# Patient Record
Sex: Female | Born: 1995 | Race: Black or African American | Hispanic: No | Marital: Single | Smoking: Never smoker
Health system: Southern US, Community
[De-identification: ages and names within clinical notes are randomized; demographics above are authoritative.]

## PROBLEM LIST (undated history)

## (undated) ENCOUNTER — Inpatient Hospital Stay (HOSPITAL_COMMUNITY): Payer: Self-pay

## (undated) DIAGNOSIS — O1490 Unspecified pre-eclampsia, unspecified trimester: Secondary | ICD-10-CM

## (undated) DIAGNOSIS — D649 Anemia, unspecified: Secondary | ICD-10-CM

## (undated) DIAGNOSIS — Z789 Other specified health status: Secondary | ICD-10-CM

## (undated) HISTORY — DX: Anemia, unspecified: D64.9

## (undated) HISTORY — PX: WISDOM TOOTH EXTRACTION: SHX21

## (undated) HISTORY — PX: NO PAST SURGERIES: SHX2092

---

## 2014-08-22 ENCOUNTER — Emergency Department (HOSPITAL_COMMUNITY): Payer: Medicaid Other

## 2014-08-22 ENCOUNTER — Encounter (HOSPITAL_COMMUNITY): Payer: Self-pay | Admitting: Family Medicine

## 2014-08-22 ENCOUNTER — Emergency Department (HOSPITAL_COMMUNITY)
Admission: EM | Admit: 2014-08-22 | Discharge: 2014-08-22 | Disposition: A | Discharge status: Home / Self Care | Payer: Medicaid Other | Attending: Emergency Medicine | Admitting: Emergency Medicine

## 2014-08-22 DIAGNOSIS — N7011 Chronic salpingitis: Secondary | ICD-10-CM | POA: Diagnosis not present

## 2014-08-22 DIAGNOSIS — Z87891 Personal history of nicotine dependence: Secondary | ICD-10-CM | POA: Diagnosis not present

## 2014-08-22 DIAGNOSIS — N760 Acute vaginitis: Secondary | ICD-10-CM | POA: Diagnosis not present

## 2014-08-22 DIAGNOSIS — Z3202 Encounter for pregnancy test, result negative: Secondary | ICD-10-CM | POA: Insufficient documentation

## 2014-08-22 DIAGNOSIS — B9689 Other specified bacterial agents as the cause of diseases classified elsewhere: Secondary | ICD-10-CM

## 2014-08-22 DIAGNOSIS — N739 Female pelvic inflammatory disease, unspecified: Secondary | ICD-10-CM | POA: Diagnosis not present

## 2014-08-22 DIAGNOSIS — N390 Urinary tract infection, site not specified: Secondary | ICD-10-CM | POA: Insufficient documentation

## 2014-08-22 DIAGNOSIS — R102 Pelvic and perineal pain: Secondary | ICD-10-CM | POA: Diagnosis present

## 2014-08-22 LAB — URINALYSIS, ROUTINE W REFLEX MICROSCOPIC
Bilirubin Urine: NEGATIVE
Glucose, UA: NEGATIVE mg/dL
Hgb urine dipstick: NEGATIVE
Ketones, ur: 15 mg/dL — AB
NITRITE: NEGATIVE
PROTEIN: 30 mg/dL — AB
Specific Gravity, Urine: 1.024 (ref 1.005–1.030)
UROBILINOGEN UA: 1 mg/dL (ref 0.0–1.0)
pH: 6 (ref 5.0–8.0)

## 2014-08-22 LAB — WET PREP, GENITAL
TRICH WET PREP: NONE SEEN
Yeast Wet Prep HPF POC: NONE SEEN

## 2014-08-22 LAB — PREGNANCY, URINE: Preg Test, Ur: NEGATIVE

## 2014-08-22 LAB — URINE MICROSCOPIC-ADD ON

## 2014-08-22 LAB — GC/CHLAMYDIA PROBE AMP (~~LOC~~) NOT AT ARMC
CHLAMYDIA, DNA PROBE: POSITIVE — AB
Neisseria Gonorrhea: NEGATIVE

## 2014-08-22 MED ORDER — METRONIDAZOLE 500 MG PO TABS
500.0000 mg | ORAL_TABLET | Freq: Once | ORAL | Status: AC
  Administered 2014-08-22: 500 mg via ORAL
  Filled 2014-08-22: qty 1

## 2014-08-22 MED ORDER — LIDOCAINE HCL (PF) 1 % IJ SOLN
2.0000 mL | Freq: Once | INTRAMUSCULAR | Status: AC
  Administered 2014-08-22: 2 mL

## 2014-08-22 MED ORDER — CEFTRIAXONE SODIUM 250 MG IJ SOLR
250.0000 mg | Freq: Once | INTRAMUSCULAR | Status: AC
  Administered 2014-08-22: 250 mg via INTRAMUSCULAR
  Filled 2014-08-22: qty 250

## 2014-08-22 MED ORDER — LIDOCAINE HCL (PF) 1 % IJ SOLN
INTRAMUSCULAR | Status: AC
Start: 2014-08-22 — End: 2014-08-22
  Administered 2014-08-22: 2 mL
  Filled 2014-08-22: qty 5

## 2014-08-22 MED ORDER — METRONIDAZOLE 500 MG PO TABS: 500.0000 mg | ORAL_TABLET | Freq: Two times a day (BID) | ORAL | Status: DC

## 2014-08-22 MED ORDER — DOXYCYCLINE HYCLATE 100 MG PO TABS
100.0000 mg | ORAL_TABLET | Freq: Once | ORAL | Status: AC
  Administered 2014-08-22: 100 mg via ORAL
  Filled 2014-08-22: qty 1

## 2014-08-22 MED ORDER — CEPHALEXIN 500 MG PO CAPS: 500.0000 mg | ORAL_CAPSULE | Freq: Four times a day (QID) | ORAL | Status: DC

## 2014-08-22 MED ORDER — DOXYCYCLINE HYCLATE 100 MG PO TABS: 100.0000 mg | ORAL_TABLET | Freq: Two times a day (BID) | ORAL | Status: DC

## 2014-08-22 NOTE — ED Notes (Signed)
Pt presents with c/o low abdominal/pelvic pain and nausea x2weeks.

## 2014-08-22 NOTE — ED Notes (Addendum)
Pt comfortable with discharge and follow up instructions. Pt declines wheelchair, escorted to waiting area by this RN. Prescriptions x3. Pt comfortable with waiting until 1440 for discharge to observe for reaction

## 2014-08-22 NOTE — Discharge Instructions (Signed)
°Emergency Department Resource Guide °1) Find a Doctor and Pay Out of Pocket °Although you won't have to find out who is covered by your insurance plan, it is a good idea to ask around and get recommendations. You will then need to call the office and see if the doctor you have chosen will accept you as a new patient and what types of options they offer for patients who are self-pay. Some doctors offer discounts or will set up payment plans for their patients who do not have insurance, but you will need to ask so you aren't surprised when you get to your appointment. ° °2) Contact Your Local Health Department °Not all health departments have doctors that can see patients for sick visits, but many do, so it is worth a call to see if yours does. If you don't know where your local health department is, you can check in your phone book. The CDC also has a tool to help you locate your state's health department, and many state websites also have listings of all of their local health departments. ° °3) Find a Walk-in Clinic °If your illness is not likely to be very severe or complicated, you may want to try a walk in clinic. These are popping up all over the country in pharmacies, drugstores, and shopping centers. They're usually staffed by nurse practitioners or physician assistants that have been trained to treat common illnesses and complaints. They're usually fairly quick and inexpensive. However, if you have serious medical issues or chronic medical problems, these are probably not your best option. ° °No Primary Care Doctor: °- Call [facility] at  832-8000 - they can help you locate a primary care doctor that  accepts your insurance, provides certain services, etc. °- Physician Referral Service- 1-800-533-3463 ° °Chronic Pain Problems: °Organization         Address  Phone   Notes  °[facility]  (336) 297-2271 Patients need to be referred by their primary care doctor.  ° °Medication  Assistance: °Organization         Address  Phone   Notes  °[place] [facility] [address] °[place], [place] [address] (336) 641-8030 --Must be a resident of [place] °-- Must have NO insurance coverage whatsoever (no Medicaid/ Medicare, etc.) °-- The pt. MUST have a primary care doctor that directs their care regularly and follows them in the community °  °[facility]  (866) 331-1348   °[employer]  (888) 892-1162   ° °Agencies that provide inexpensive medical care: °Organization         Address  Phone   Notes  °[facility]  (336) 832-8035   °[facility]    (336) 832-7272   °[facility] [address] °[place], [place] [address] (336) 832-4777   °[facility] [address], °[place] (336) 271-4999   °[facility]    (336) 373-0678   °[facility]    (336) 272-1050   °[facility] ° [address], [place] Phone:  (336) 832-4444, Fax:  (336) 832-4440 Hours of Operation:  9 am - 6 pm, M-F.  Also accepts Medicaid/Medicare and self-pay.  °[facility] ° [address], [place] Phone: (336) 832-3150, Fax: (336) 832-3151. Hours of Operation:  8:30 am - 5:30 pm, M-F.  Also accepts Medicaid and self-pay.  °[facility] [place] [address], [place] Phone: (336) 878-6027   °[facility] [address], [place], [employer] (336)723-1848, Ext. 123 Mondays & Thursdays: 7-9 AM.  First 15 patients are seen on a first come, first serve basis. °  ° °Medicaid-accepting [place] Providers: ° °Organization         Address  Phone   Notes  °[facility] [address], [place] (336) 641-2100 Also accepts self-pay patients.  °[facility] [address], [place] ° (336) 856-9996   °[facility] [address] [place] [address], [place]  (336) 288-8857   °[facility] [address] [place] [address], [place] (336) 299-7000   °Veita Bland [address], [place]  ° (336) 373-1557 Only accepts [place] Access Medicaid patients after they have their name applied to their card.  ° °Self-Pay (no insurance) in [place]: ° °Organization         Address  Phone   Notes  °Sickle Cell Patients, [facility] [address], [place] (336) 832-1970   °[facility] [address], [place] (336) 832-4400   °[facility] ° [address], [place] (336) 992-4800   °[facility]/Dr. Osei-Bonsu ° [address] [place] [address], [place] or [address], [place] (336) 841-8500 Phone number for both [place] and [place] locations is the same.  °[facility] [address], [place] (336) 299-0000   °[facility] [address] [place] [address], [place] or [address] (336) 852-7530 °(336) 878-2260   °[facility] [address], [place] (336) 350-1642, phone; (336) 294-5005, fax Sees patients 1st and 3rd Saturday of every month.  Must not qualify for public or private insurance (i.e. Medicaid, Medicare, [place] Health Choice, Veterans' Benefits) • Household income should be no more than 200% of the poverty level •The clinic cannot treat you if you are pregnant or think you are pregnant • Sexually transmitted diseases are not treated at the clinic.  ° ° °Dental Care: °Organization         Address  Phone  Notes  °[place] [facility] [address], [place] (336) 641-6152 Accepts children up to age 21 who are enrolled in Medicaid or [place] Health Choice; pregnant women with a Medicaid card; and children who have applied for Medicaid or [place] Health Choice, but were declined, whose parents can pay a reduced fee at time of service.  °[place]  [facility] [place]  [address] [place] [address], [place] (336) 641-7733 Accepts children up to age 21 who are enrolled in Medicaid or [place] Health Choice; pregnant women with a Medicaid card; and children who have applied for Medicaid or  Health Choice, but were declined, whose parents can pay a reduced fee at time of service.  °[facility] ° [address], [place] (336) 641-4533 Patients are seen by appointment only. Walk-ins are not accepted. [facility] will see patients 18 years of age and older. °Monday - Tuesday (8am-5pm) °Most Wednesdays (8:30-5pm) °$30 per visit, cash only  °[facility] ° [address] [place] [address], [place] (336) 641-4533 Patients are seen by appointment only. Walk-ins are not accepted. [facility] will see patients 18 years of age and older. °One   Wednesday Evening (Monthly: Volunteer Based).  $30 per visit, cash only  °[facility]  (919) 537-3737 for adults; Children under age 4, call Graduate Pediatric Dentistry at (919) [phone]. Children aged 4-14, please call (919) [phone] to request a pediatric application. ° Dental services are provided in all areas of dental care including fillings, crowns and bridges, complete and partial dentures, implants, gum treatment, root canals, and extractions. Preventive care is also provided. Treatment is provided to both adults and children. °Patients are selected via a lottery and there is often a waiting list. °  °[facility] [address], °[place] ° (336) 763-8833 [email or web address] °  °[facility] [address], [place], [place] (336)723-1848, Ext. 123 Second and Fourth Thursday of each month, opens at 6:30 AM; Clinic ends at 9 AM.  Patients are seen on a first-come first-served basis, and a limited number are seen during each clinic.  ° °[facility] ° [address], [place], [place] (336) 723-7904    Eligibility Requirements °You must have lived in [place], [place], or [place] counties for at least the last three months. °  You cannot be eligible for state or federal sponsored healthcare insurance, including Veterans Administration, Medicaid, or Medicare. °  You generally cannot be eligible for healthcare insurance through your employer.  °  How to apply: °Eligibility screenings are held every Tuesday and Wednesday afternoon from 1:00 pm until 4:00 pm. You do not need an appointment for the interview!  °[facility] [address], [place], [employer] 336-631-2330   °[facility]  336-342-8273   °[facility]  336-703-3100   °[facility]  336-570-6415   ° °Behavioral Health Resources in the Community: °Intensive Outpatient Programs °Organization         Address  Phone  Notes  °[facility] [address], [place], [place] 336-878-6098   °[facility] [address], [place], [place] 336-832-9800   °[facility] [address], [place], [place] ° 336-882-2125   °[facility] [address],  °[place], [place] 1-800-853-5163 or 336-641-4981   °Substance Abuse Resources °Organization         Address  Phone  Notes  °[facility]  336-882-2125   °[facility]  336-784-9470   °[facility]  336-285-9073   °[facility]  336-845-3988   °Residential & Outpatient Substance Abuse Program  1-800-659-3381   °Psychological Services °Organization         Address  Phone  Notes  °[facility]  336- 832-9600   °[facility]  336- 378-7881   °[facility] [address], [place] 1-800-853-5163 or 336-641-4981   ° °Mobile Crisis Teams °Organization         Address  Phone  Notes  °[facility], Mobile Crisis Care Unit  1-877-626-1772   °Assertive °[facility] ° [address]  [place], [place] 336-834-9664   °Sharon DeEsch [address] °[place] 336-554-5454   ° °Self-Help/Support Groups °Organization         Address  Phone             Notes  °[facility] - variety of support groups  336- 373-1402 Call for more information  °Narcotics Anonymous (NA), [facility] [address], °[place]  2 meetings at this location  ° °  Residential Treatment Programs °Organization         Address  Phone  Notes  °[facility] [address],    °[place]  1-866-801-8205   °[facility] ° [address], [place], [place] 704-293-8524   °[facility] [address], [place] 336-845-3988 Admissions: 8am-3pm M-F  °[facility] [address]    °[place], [place] 336-841-1104   °[facility] [address], [place], [place] 336-379-7146   °[facility] [address]  °[place], [place] 336-285-9073   °[facility] - Intensive Outpatient [address], [place], [place] 336-852-3033   °[facility] [address]  °[place], [place] 1-877-615-2722 or 336-784-9470   °[facility] [address] [place], [place] 336-227-7417 Accepts Medicaid  °[facility] [address]  °[place] [employer] [place] 1-800-659-3381 Substance Abuse/Addiction Treatment  ° °[place] Behavioral Health Resources °Organization         Address  Phone  Notes  °[facility]  (888) 581-9988   °Julie Brannon, PhD [address] [place], [place]   (336) 349-5553 or (336) 951-0000   °[facility]   [address] °[place], [place] (336) 349-4454   °[facility] [address], [place], [place] (336) 342-8316 Insurance/Medicaid/sponsorship through [facility]  °[facility] [address]                                    [place], [place] (336) 342-8316 Therapy/tele-psych/case    °[facility] [address]  ° [place],  (336) 349-2233    °Dr. Arfeen  (336) 349-4544   °[facility] [place]  [employer] [place] [facility] 1) [address], [place] °2) [address], [place] °3)  [address], [place] (336) 349-3220 °(336) 342-7768 ° °(336) 342-8140   °[place] [facility] (336) 342-1394 or (336) 342-3537 (After Hours)    ° ° ° °Take the prescriptions as directed.  Call your regular OB/GYN doctor today to schedule a follow up appointment within the next 3 days.  Return to the Emergency Department immediately sooner if worsening.  ° °

## 2014-08-22 NOTE — ED Notes (Signed)
Contacted radiology regarding delay in results. States results have been submitted.  This RN contacted IT department to inquire about delay.

## 2014-08-22 NOTE — ED Provider Notes (Signed)
CSN: [id number]     Arrival date & time 08/22/14  1012 History   First MD Initiated Contact with Patient 08/22/14 1018     Chief Complaint  Patient presents with  . Pelvic Pain      HPI  Pt was seen at 1025.  Per pt, c/o gradual onset and persistence of constant pelvic pain for the past 2 weeks. Has been associated with intermittent nausea. LMP "the beginning of last month." Denies vaginal bleeding/discharge, no dysuria/hematuria, no flank pain, no N/V/D, no fevers, no rash.    History reviewed. No pertinent past medical history.   History reviewed. No pertinent past surgical history.  History  Substance Use Topics  . Smoking status: Former Games developermoker  . Smokeless tobacco: Not on file  . Alcohol Use: Yes     Comment: occ    Review of Systems ROS: Statement: All systems negative except as marked or noted in the HPI; Constitutional: Negative for fever and chills. ; ; Eyes: Negative for eye pain, redness and discharge. ; ; ENMT: Negative for ear pain, hoarseness, nasal congestion, sinus pressure and sore throat. ; ; Cardiovascular: Negative for chest pain, palpitations, diaphoresis, dyspnea and peripheral edema. ; ; Respiratory: Negative for cough, wheezing and stridor. ; ; Gastrointestinal: +nausea. Negative for vomiting, diarrhea, abdominal pain, blood in stool, hematemesis, jaundice and rectal bleeding. . ; ; Genitourinary: Negative for dysuria, flank pain and hematuria. ; ; GYN:  +pelvic pain. No vaginal bleeding, no vaginal discharge, no vulvar pain. ;; Musculoskeletal: Negative for back pain and neck pain. Negative for swelling and trauma.; ; Skin: Negative for pruritus, rash, abrasions, blisters, bruising and skin lesion.; ; Neuro: Negative for headache, lightheadedness and neck stiffness. Negative for weakness, altered level of consciousness , altered mental status, extremity weakness, paresthesias, involuntary movement, seizure and syncope.      Allergies  Review of patient's  allergies indicates no known allergies.  Home Medications   Prior to Admission medications   Not on File   BP 117/73 mmHg  Pulse 87  Temp(Src) 98.8 F (37.1 C) (Oral)  Resp 16  Ht 5\' 1"  (1.549 m)  SpO2 100%  LMP 07/03/2014 (Approximate) Physical Exam  1030: Physical examination:  Nursing notes reviewed; Vital signs and O2 SAT reviewed;  Constitutional: Well developed, Well nourished, Well hydrated, In no acute distress; Head:  Normocephalic, atraumatic; Eyes: EOMI, PERRL, No scleral icterus; ENMT: Mouth and pharynx normal, Mucous membranes moist; Neck: Supple, Full range of motion, No lymphadenopathy; Cardiovascular: Regular rate and rhythm, No murmur, rub, or gallop; Respiratory: Breath sounds clear & equal bilaterally, No rales, rhonchi, wheezes.  Speaking full sentences with ease, Normal respiratory effort/excursion; Chest: Nontender, Movement normal; Abdomen: Soft, +mild suprapubic tenderness to palp. No rebound or guarding. Nondistended, Normal bowel sounds; Genitourinary: No CVA tenderness. Pelvic exam performed with permission of pt and female ED RN assist during exam.  External genitalia w/o lesions. Vaginal vault with thick white discharge.  Cervix w/o lesions, not friable, GC/chlam and wet prep obtained and sent to lab.  Bimanual exam w/o CMT, uterine or adnexal tenderness.;; Extremities: Pulses normal, No tenderness, No edema, No calf edema or asymmetry.; Neuro: AA&Ox3, Major CN grossly intact.  Speech clear. No gross focal motor or sensory deficits in extremities.; Skin: Color normal, Warm, Dry.   ED Course  Procedures     EKG Interpretation None      MDM  MDM Reviewed: nursing note and vitals Interpretation: labs and ultrasound     IMPRESSION:  No sonographic evidence for ovarian torsion.  3.5 cm indeterminate complex cystic lesion in the left adnexa which is contiguous with the left ovary. Differential diagnosis includes cystic ovarian neoplasm and hydro  pyosalpinx. 4.8 cm complex cystic lesion in the right adnexa, suspicious for hydrosalpinx. Probable small collapsing right ovarian corpus luteum. Recommend followup by pelvic ultrasound in 6-12 weeks; alternatively, pelvic MRI without and with contrast could be performed for further evaluation.    Results for orders placed or performed during the hospital encounter of 08/22/14  Wet prep, genital  Result Value Ref Range   Yeast Wet Prep HPF POC NONE SEEN NONE SEEN   Trich, Wet Prep NONE SEEN NONE SEEN   Clue Cells Wet Prep HPF POC FEW (A) NONE SEEN   WBC, Wet Prep HPF POC MODERATE (A) NONE SEEN  Pregnancy, urine  Result Value Ref Range   Preg Test, Ur NEGATIVE NEGATIVE  Urinalysis, Routine w reflex microscopic  Result Value Ref Range   Color, Urine AMBER (A) YELLOW   APPearance CLEAR CLEAR   Specific Gravity, Urine 1.024 1.005 - 1.030   pH 6.0 5.0 - 8.0   Glucose, UA NEGATIVE NEGATIVE mg/dL   Hgb urine dipstick NEGATIVE NEGATIVE   Bilirubin Urine NEGATIVE NEGATIVE   Ketones, ur 15 (A) NEGATIVE mg/dL   Protein, ur 30 (A) NEGATIVE mg/dL   Urobilinogen, UA 1.0 0.0 - 1.0 mg/dL   Nitrite NEGATIVE NEGATIVE   Leukocytes, UA SMALL (A) NEGATIVE  Urine microscopic-add on  Result Value Ref Range   Squamous Epithelial / LPF FEW (A) RARE   WBC, UA 11-20 <3 WBC/hpf   RBC / HPF 0-2 <3 RBC/hpf   Bacteria, UA FEW (A) RARE   Urine-Other MUCOUS PRESENT     1340:  GC/chlam pending, but will tx empirically given US findings. Will also tx for BV and UTI.  Pt states she wants to go home now. Dx and testing d/w pt.  Questions answered.  Verb understanding, agreeable to d/c home with outpt f/u.    Samuel Jester, DO 08/25/14 1342

## 2014-08-30 ENCOUNTER — Telehealth (HOSPITAL_BASED_OUTPATIENT_CLINIC_OR_DEPARTMENT_OTHER): Payer: Self-pay | Admitting: Emergency Medicine

## 2014-09-03 ENCOUNTER — Telehealth: Payer: Self-pay | Admitting: *Deleted

## 2014-09-05 ENCOUNTER — Telehealth (HOSPITAL_BASED_OUTPATIENT_CLINIC_OR_DEPARTMENT_OTHER): Payer: Self-pay | Admitting: Emergency Medicine

## 2014-09-05 NOTE — Telephone Encounter (Signed)
Letter sent, unable to contact patient by phone regarding positive Chlamydia

## 2014-09-17 ENCOUNTER — Telehealth (HOSPITAL_COMMUNITY): Payer: Self-pay

## 2014-09-17 NOTE — ED Notes (Signed)
Unable to contact pt by mail or telephone. Unable to communicate lab results or treatment changes. 

## 2014-11-14 ENCOUNTER — Telehealth (HOSPITAL_BASED_OUTPATIENT_CLINIC_OR_DEPARTMENT_OTHER): Payer: Self-pay | Admitting: Emergency Medicine

## 2014-11-14 NOTE — Telephone Encounter (Signed)
Letter sent on 09/06/2014 returned with no known address, unable to forward

## 2014-11-21 ENCOUNTER — Encounter (HOSPITAL_COMMUNITY): Payer: Self-pay | Admitting: *Deleted

## 2014-11-21 ENCOUNTER — Inpatient Hospital Stay (HOSPITAL_COMMUNITY)
Admission: AD | Admit: 2014-11-21 | Discharge: 2014-11-21 | Disposition: A | Discharge status: Home / Self Care | Payer: Medicaid Other | Source: Ambulatory Visit | Attending: Obstetrics & Gynecology | Admitting: Obstetrics & Gynecology

## 2014-11-21 DIAGNOSIS — N923 Ovulation bleeding: Secondary | ICD-10-CM | POA: Insufficient documentation

## 2014-11-21 DIAGNOSIS — Z87891 Personal history of nicotine dependence: Secondary | ICD-10-CM | POA: Insufficient documentation

## 2014-11-21 DIAGNOSIS — N939 Abnormal uterine and vaginal bleeding, unspecified: Secondary | ICD-10-CM | POA: Diagnosis not present

## 2014-11-21 DIAGNOSIS — R109 Unspecified abdominal pain: Secondary | ICD-10-CM | POA: Insufficient documentation

## 2014-11-21 HISTORY — DX: Other specified health status: Z78.9

## 2014-11-21 LAB — CBC
HCT: 30.6 % — ABNORMAL LOW (ref 36.0–46.0)
Hemoglobin: 9.9 g/dL — ABNORMAL LOW (ref 12.0–15.0)
MCH: 25.9 pg — AB (ref 26.0–34.0)
MCHC: 32.4 g/dL (ref 30.0–36.0)
MCV: 80.1 fL (ref 78.0–100.0)
PLATELETS: 287 10*3/uL (ref 150–400)
RBC: 3.82 MIL/uL — AB (ref 3.87–5.11)
RDW: 14.7 % (ref 11.5–15.5)
WBC: 8.6 10*3/uL (ref 4.0–10.5)

## 2014-11-21 LAB — URINALYSIS, ROUTINE W REFLEX MICROSCOPIC
Bilirubin Urine: NEGATIVE
GLUCOSE, UA: NEGATIVE mg/dL
Ketones, ur: NEGATIVE mg/dL
Leukocytes, UA: NEGATIVE
Nitrite: NEGATIVE
PH: 5.5 (ref 5.0–8.0)
Protein, ur: NEGATIVE mg/dL
Specific Gravity, Urine: >1.03 — ABNORMAL HIGH (ref 1.005–1.030)
Urobilinogen, UA: 0.2 mg/dL (ref 0.0–1.0)

## 2014-11-21 LAB — WET PREP, GENITAL
Clue Cells Wet Prep HPF POC: NONE SEEN
Trich, Wet Prep: NONE SEEN
YEAST WET PREP: NONE SEEN

## 2014-11-21 LAB — URINE MICROSCOPIC-ADD ON

## 2014-11-21 LAB — POCT PREGNANCY, URINE: Preg Test, Ur: NEGATIVE

## 2014-11-21 NOTE — MAU Note (Signed)
Pt states she started having abdominal pain about 2-3 wks ago, pt states she started having vaginal bleeding today, lmp 11/08/2014.

## 2014-11-21 NOTE — MAU Provider Note (Signed)
History     CSN: [id number]  Arrival date and time: 11/21/14 7829   None     Chief Complaint  Patient presents with  . Abdominal Pain  . Vaginal Bleeding   HPI Comments: Ms.Sonya Frazier is a 19 y.o. Female [id number] who presents to MAU with abdominal pain and vaginal bleeding.  This is a new problem; she is sexually active with one partner who she has been with for almost a year. Denies history of STD's. She is concerned because this is the second bleeding episode she has had this month; she has already had a cycle this month and is now having brown, bleeding.   Abdominal Pain This is a new problem. The current episode started 1 to 4 weeks ago. The onset quality is gradual. The problem occurs intermittently. The problem has been unchanged. The pain is located in the LLQ, RLQ and suprapubic region. The pain is at a severity of 0/10. The quality of the pain is cramping. Pertinent negatives include no constipation, diarrhea, fever, nausea or vomiting. Treatments tried: ibuprofen  The treatment provided mild relief.    OB History    Gravida Para Term Preterm AB TAB SAB Ectopic Multiple Living        Past Medical History  Diagnosis Date  . Medical history non-contributory     Past Surgical History  Procedure Laterality Date  . Wisdom tooth extraction      Family History  Problem Relation Age of Onset  . Asthma Mother   . Cancer Maternal Aunt     History  Substance Use Topics  . Smoking status: Former Games developer  . Smokeless tobacco: Not on file  . Alcohol Use: Yes     Comment: occ    Allergies: No Known Allergies  Prescriptions prior to admission  Medication Sig Dispense Refill Last Dose  . cephALEXin (KEFLEX) 500 MG capsule Take 1 capsule (500 mg total) by mouth 4 (four) times daily. (Patient not taking: Reported on 11/21/2014) 40 capsule 0   . doxycycline (VIBRA-TABS) 100 MG tablet Take 1 tablet (100 mg total) by mouth 2 (two) times daily.  (Patient not taking: Reported on 11/21/2014) 14 tablet 0   . metroNIDAZOLE (FLAGYL) 500 MG tablet Take 1 tablet (500 mg total) by mouth 2 (two) times daily. (Patient not taking: Reported on 11/21/2014) 14 tablet 0    Results for orders placed or performed during the hospital encounter of 11/21/14 (from the past 48 hour(s))  Urinalysis, Routine w reflex microscopic     Status: Abnormal   Collection Time: 11/21/14  7:11 PM  Result Value Ref Range   Color, Urine YELLOW YELLOW   APPearance CLEAR CLEAR   Specific Gravity, Urine >1.030 (H) 1.005 - 1.030   pH 5.5 5.0 - 8.0   Glucose, UA NEGATIVE NEGATIVE mg/dL   Hgb urine dipstick LARGE (A) NEGATIVE   Bilirubin Urine NEGATIVE NEGATIVE   Ketones, ur NEGATIVE NEGATIVE mg/dL   Protein, ur NEGATIVE NEGATIVE mg/dL   Urobilinogen, UA 0.2 0.0 - 1.0 mg/dL   Nitrite NEGATIVE NEGATIVE   Leukocytes, UA NEGATIVE NEGATIVE  Urine microscopic-add on     Status: Abnormal   Collection Time: 11/21/14  7:11 PM  Result Value Ref Range   Squamous Epithelial / LPF FEW (A) RARE   WBC, UA 0-2 <3 WBC/hpf   RBC / HPF 3-6 <3 RBC/hpf   Bacteria, UA RARE RARE   Urine-Other MUCOUS PRESENT  Pregnancy, urine POC     Status: None   Collection Time: 11/21/14  7:15 PM  Result Value Ref Range   Preg Test, Ur NEGATIVE NEGATIVE    Comment:        THE SENSITIVITY OF THIS METHODOLOGY IS >24 mIU/mL     Review of Systems  Constitutional: Negative for fever.  Gastrointestinal: Positive for abdominal pain. Negative for nausea, vomiting, diarrhea and constipation.   Physical Exam   Blood pressure 125/82, pulse 90, temperature 98.5 F (36.9 C), temperature source Oral, resp. rate 18, height 4' 11.5" (1.511 m), weight 55.339 kg (122 lb), last menstrual period 11/06/2014.  Physical Exam  Constitutional: She is oriented to person, place, and time. She appears well-developed and well-nourished. No distress.  HENT:  Head: Normocephalic.  Eyes: Pupils are equal, round, and  reactive to light.  Neck: Neck supple.  Respiratory: Effort normal.  GI: Soft. There is tenderness in the suprapubic area. There is no rebound, no guarding and no CVA tenderness.  Genitourinary:  Bimanual exam: Cervix closed, no CMT, dark brown discharge noted on exam glove.  Uterus non tender, normal size Adnexa non tender, no masses bilaterally, + suprapubic tenderness  GC/Chlam, wet prep done; blind swab  Chaperone present for exam.  Musculoskeletal: Normal range of motion.  Neurological: She is alert and oriented to person, place, and time.  Skin: Skin is warm. She is not diaphoretic.  Psychiatric: Her behavior is normal.   Results for orders placed or performed during the hospital encounter of 11/21/14 (from the past 24 hour(s))  Urinalysis, Routine w reflex microscopic     Status: Abnormal   Collection Time: 11/21/14  7:11 PM  Result Value Ref Range   Color, Urine YELLOW YELLOW   APPearance CLEAR CLEAR   Specific Gravity, Urine >1.030 (H) 1.005 - 1.030   pH 5.5 5.0 - 8.0   Glucose, UA NEGATIVE NEGATIVE mg/dL   Hgb urine dipstick LARGE (A) NEGATIVE   Bilirubin Urine NEGATIVE NEGATIVE   Ketones, ur NEGATIVE NEGATIVE mg/dL   Protein, ur NEGATIVE NEGATIVE mg/dL   Urobilinogen, UA 0.2 0.0 - 1.0 mg/dL   Nitrite NEGATIVE NEGATIVE   Leukocytes, UA NEGATIVE NEGATIVE  Urine microscopic-add on     Status: Abnormal   Collection Time: 11/21/14  7:11 PM  Result Value Ref Range   Squamous Epithelial / LPF FEW (A) RARE   WBC, UA 0-2 <3 WBC/hpf   RBC / HPF 3-6 <3 RBC/hpf   Bacteria, UA RARE RARE   Urine-Other MUCOUS PRESENT   Pregnancy, urine POC     Status: None   Collection Time: 11/21/14  7:15 PM  Result Value Ref Range   Preg Test, Ur NEGATIVE NEGATIVE  Wet prep, genital     Status: Abnormal   Collection Time: 11/21/14  8:00 PM  Result Value Ref Range   Yeast Wet Prep HPF POC NONE SEEN NONE SEEN   Trich, Wet Prep NONE SEEN NONE SEEN   Clue Cells Wet Prep HPF POC NONE SEEN  NONE SEEN   WBC, Wet Prep HPF POC FEW (A) NONE SEEN  CBC     Status: Abnormal   Collection Time: 11/21/14  8:14 PM  Result Value Ref Range   WBC 8.6 4.0 - 10.5 K/uL   RBC 3.82 (L) 3.87 - 5.11 MIL/uL   Hemoglobin 9.9 (L) 12.0 - 15.0 g/dL   HCT 16.1 (L) 09.6 - 04.5 %   MCV 80.1 78.0 - 100.0 fL   MCH 25.9 (  L) 26.0 - 34.0 pg   MCHC 32.4 30.0 - 36.0 g/dL   RDW 96.014.7 45.411.5 - 09.815.5 %   Platelets 287 150 - 400 K/uL    MAU Course  Procedures  None  MDM  Wet prep GC  HIV   Report given to Thressa ShellerHeather Jaelan Rasheed CNM who resumes care of the patient   Assessment and Plan   1. Intermenstrual spotting    Recommended patient to keep a menstrual calendar If intermenstrual spotting continues FU with PCP or HD Return to MAU as needed  Follow-up Information    [facility] [place].   Why:  If symptoms worsen   Contact information:   [address] [facility] [place] [id number] [phone]     Tawnya CrookHogan, Rishik Tubby Donovan 8:36 PM 11/21/2014    Duane LopeJennifer I Rasch, NP 11/21/2014 7:51 PM

## 2014-11-21 NOTE — MAU Note (Signed)
So far the past 2 wks has been having sharp pains in abd.  Had a period the beginning of the month and started bleeding again today.

## 2014-11-22 LAB — GC/CHLAMYDIA PROBE AMP (~~LOC~~) NOT AT ARMC
Chlamydia: NEGATIVE
Neisseria Gonorrhea: NEGATIVE

## 2014-11-22 LAB — HIV ANTIBODY (ROUTINE TESTING W REFLEX): HIV SCREEN 4TH GENERATION: NONREACTIVE

## 2016-06-28 ENCOUNTER — Emergency Department (HOSPITAL_COMMUNITY)
Admission: EM | Admit: 2016-06-28 | Discharge: 2016-06-28 | Disposition: A | Discharge status: Home / Self Care | Payer: Medicaid Other | Attending: Emergency Medicine | Admitting: Emergency Medicine

## 2016-06-28 ENCOUNTER — Encounter (HOSPITAL_COMMUNITY): Payer: Self-pay

## 2016-06-28 DIAGNOSIS — Z87891 Personal history of nicotine dependence: Secondary | ICD-10-CM | POA: Insufficient documentation

## 2016-06-28 DIAGNOSIS — N12 Tubulo-interstitial nephritis, not specified as acute or chronic: Secondary | ICD-10-CM | POA: Insufficient documentation

## 2016-06-28 LAB — CBC WITH DIFFERENTIAL/PLATELET
Basophils Absolute: 0 10*3/uL (ref 0.0–0.1)
Basophils Relative: 0 %
Eosinophils Absolute: 0.1 10*3/uL (ref 0.0–0.7)
Eosinophils Relative: 1 %
HEMATOCRIT: 35.4 % — AB (ref 36.0–46.0)
Hemoglobin: 11.4 g/dL — ABNORMAL LOW (ref 12.0–15.0)
Lymphocytes Relative: 30 %
Lymphs Abs: 3.3 10*3/uL (ref 0.7–4.0)
MCH: 26.6 pg (ref 26.0–34.0)
MCHC: 32.2 g/dL (ref 30.0–36.0)
MCV: 82.5 fL (ref 78.0–100.0)
MONO ABS: 0.8 10*3/uL (ref 0.1–1.0)
Monocytes Relative: 7 %
NEUTROS PCT: 62 %
Neutro Abs: 6.6 10*3/uL (ref 1.7–7.7)
PLATELETS: 356 10*3/uL (ref 150–400)
RBC: 4.29 MIL/uL (ref 3.87–5.11)
RDW: 13.3 % (ref 11.5–15.5)
WBC: 10.8 10*3/uL — ABNORMAL HIGH (ref 4.0–10.5)

## 2016-06-28 LAB — COMPREHENSIVE METABOLIC PANEL
ALK PHOS: 67 U/L (ref 38–126)
ALT: 12 U/L — ABNORMAL LOW (ref 14–54)
ANION GAP: 8 (ref 5–15)
AST: 17 U/L (ref 15–41)
Albumin: 4.2 g/dL (ref 3.5–5.0)
BUN: 11 mg/dL (ref 6–20)
CO2: 26 mmol/L (ref 22–32)
Calcium: 9.2 mg/dL (ref 8.9–10.3)
Chloride: 104 mmol/L (ref 101–111)
Creatinine, Ser: 0.84 mg/dL (ref 0.44–1.00)
GLUCOSE: 105 mg/dL — AB (ref 65–99)
Potassium: 3.3 mmol/L — ABNORMAL LOW (ref 3.5–5.1)
Sodium: 138 mmol/L (ref 135–145)
TOTAL PROTEIN: 8.5 g/dL — AB (ref 6.5–8.1)
Total Bilirubin: 0.6 mg/dL (ref 0.3–1.2)

## 2016-06-28 LAB — URINALYSIS, ROUTINE W REFLEX MICROSCOPIC
BILIRUBIN URINE: NEGATIVE
GLUCOSE, UA: NEGATIVE mg/dL
Ketones, ur: NEGATIVE mg/dL
Nitrite: NEGATIVE
PH: 6 (ref 5.0–8.0)
Protein, ur: 30 mg/dL — AB
SPECIFIC GRAVITY, URINE: 1.016 (ref 1.005–1.030)

## 2016-06-28 LAB — URINE MICROSCOPIC-ADD ON

## 2016-06-28 LAB — PREGNANCY, URINE: Preg Test, Ur: NEGATIVE

## 2016-06-28 MED ORDER — KETOROLAC TROMETHAMINE 15 MG/ML IJ SOLN
15.0000 mg | Freq: Once | INTRAMUSCULAR | Status: AC
  Administered 2016-06-28: 15 mg via INTRAVENOUS
  Filled 2016-06-28: qty 1

## 2016-06-28 MED ORDER — CEPHALEXIN 500 MG PO CAPS: 500.0000 mg | ORAL_CAPSULE | Freq: Three times a day (TID) | ORAL | 0 refills | Status: DC

## 2016-06-28 MED ORDER — CEFTRIAXONE SODIUM 1 G IJ SOLR
1.0000 g | Freq: Once | INTRAMUSCULAR | Status: AC
  Administered 2016-06-28: 1 g via INTRAVENOUS
  Filled 2016-06-28: qty 10

## 2016-06-28 MED ORDER — SODIUM CHLORIDE 0.9 % IV BOLUS (SEPSIS)
1000.0000 mL | Freq: Once | INTRAVENOUS | Status: AC
  Administered 2016-06-28: 1000 mL via INTRAVENOUS

## 2016-06-28 MED ORDER — POTASSIUM CHLORIDE CRYS ER 20 MEQ PO TBCR
40.0000 meq | EXTENDED_RELEASE_TABLET | Freq: Once | ORAL | Status: AC
  Administered 2016-06-28: 40 meq via ORAL
  Filled 2016-06-28: qty 2

## 2016-06-28 NOTE — ED Triage Notes (Addendum)
Pt with bilateral flank pain x 1 week. Some burning with urination.  No n/v.  No discharge.  Pain worse with movement.  No fever.  No injury noted.

## 2016-06-28 NOTE — ED Provider Notes (Signed)
[facility]-EMERGENCY DEPT Provider Note   CSN: [id number] Arrival date & time: 06/28/16  1715     History   Chief Complaint Chief Complaint  Patient presents with  . Flank Pain    HPI Sonya Frazier is a 20 y.o. female.  The history is provided by the patient. No language interpreter was used.  Flank Pain    Sonya Frazier is a 20 y.o. female who presents to the Emergency Department complaining of flank pain.  She reports 1 week of bilateral flank pain that she describes as kidney pain. The pain is sharp and burning in nature. It is worse in the mornings and with walking. At time the pain radiates to bilateral sides. She has associated dysuria. No hematuria, frequency or change in odor. No fevers, nausea, vomiting. No recent injuries. No prior similar symptoms. She has no history of UTI or kidney stone. No vaginal discharge. Symptoms are moderate, constant, worsening. Past Medical History:  Diagnosis Date  . Medical history non-contributory     There are no active problems to display for this patient.   Past Surgical History:  Procedure Laterality Date  . WISDOM TOOTH EXTRACTION      OB History    Gravida Para Term Preterm AB Living   0 0 0 0 0 0   SAB TAB Ectopic Multiple Live Births   0 0 0 0         Home Medications    Prior to Admission medications   Medication Sig Start Date End Date Taking? Authorizing Provider  cephALEXin (KEFLEX) 500 MG capsule Take 1 capsule (500 mg total) by mouth 3 (three) times daily. 06/28/16   Tilden FossaElizabeth Cherelle Midkiff, MD    Family History Family History  Problem Relation Age of Onset  . Asthma Mother   . Cancer Maternal Aunt     Social History Social History  Substance Use Topics  . Smoking status: Former Games developermoker  . Smokeless tobacco: Never Used  . Alcohol use Yes     Comment: occ     Allergies   Patient has no known allergies.   Review of Systems Review of Systems  Genitourinary: Positive for flank pain.  All other  systems reviewed and are negative.    Physical Exam Updated Vital Signs BP 111/78   Pulse 92   Temp 98.2 F (36.8 C) (Oral)   Resp 12   Ht 5' (1.524 m)   Wt 122 lb (55.3 kg)   LMP 06/03/2016 (Approximate)   SpO2 97%   BMI 23.83 kg/m   Physical Exam  Constitutional: She is oriented to person, place, and time. She appears well-developed and well-nourished.  HENT:  Head: Normocephalic and atraumatic.  Cardiovascular: Normal rate and regular rhythm.   No murmur heard. Pulmonary/Chest: Effort normal and breath sounds normal. No respiratory distress.  Abdominal: Soft. There is no tenderness. There is no rebound and no guarding.  Bilateral CVA tenderness  Musculoskeletal: She exhibits no edema or tenderness.  Neurological: She is alert and oriented to person, place, and time.  5 out of 5 strength in bilateral lower extremities  Skin: Skin is warm and dry.  Psychiatric: She has a normal mood and affect. Her behavior is normal.  Nursing note and vitals reviewed.    ED Treatments / Results  Labs (all labs ordered are listed, but only abnormal results are displayed) Labs Reviewed  URINALYSIS, ROUTINE W REFLEX MICROSCOPIC (NOT AT [facility]) - Abnormal; Notable for the following:  Result Value   APPearance CLOUDY (*)    Hgb urine dipstick SMALL (*)    Protein, ur 30 (*)    Leukocytes, UA MODERATE (*)    All other components within normal limits  COMPREHENSIVE METABOLIC PANEL - Abnormal; Notable for the following:    Potassium 3.3 (*)    Glucose, Bld 105 (*)    Total Protein 8.5 (*)    ALT 12 (*)    All other components within normal limits  CBC WITH DIFFERENTIAL/PLATELET - Abnormal; Notable for the following:    WBC 10.8 (*)    Hemoglobin 11.4 (*)    HCT 35.4 (*)    All other components within normal limits  URINE MICROSCOPIC-ADD ON - Abnormal; Notable for the following:    Squamous Epithelial / LPF 0-5 (*)    Bacteria, UA RARE (*)    All other components within normal  limits  URINE CULTURE  PREGNANCY, URINE    EKG  EKG Interpretation None       Radiology No results found.  Procedures Procedures (including critical care time)  Medications Ordered in ED Medications  sodium chloride 0.9 % bolus 1,000 mL (0 mLs Intravenous Stopped 06/28/16 1920)  ketorolac (TORADOL) 15 MG/ML injection 15 mg (15 mg Intravenous Given 06/28/16 1804)  cefTRIAXone (ROCEPHIN) 1 g in dextrose 5 % 50 mL IVPB (0 g Intravenous Stopped 06/28/16 2013)  potassium chloride SA (K-DUR,KLOR-CON) CR tablet 40 mEq (40 mEq Oral Given 06/28/16 2003)     Initial Impression / Assessment and Plan / ED Course  I have reviewed the triage vital signs and the nursing notes.  Pertinent labs & imaging results that were available during my care of the patient were reviewed by me and considered in my medical decision making (see chart for details).  Clinical Course     Patient here for evaluation of bilateral flank pain. She is nontoxic appearing on examination. Presentation is not consistent with epidural abscess, renal colic. We'll treat for pyelonephritis given her pyuria and flank pain. Discussed home care, outpatient follow-up, return precautions.  Final Clinical Impressions(s) / ED Diagnoses   Final diagnoses:  Pyelonephritis    New Prescriptions Discharge Medication List as of 06/28/2016  8:12 PM    START taking these medications   Details  cephALEXin (KEFLEX) 500 MG capsule Take 1 capsule (500 mg total) by mouth 3 (three) times daily., Starting Sun 06/28/2016, Print         Tilden FossaElizabeth Cadence Haslam, MD 06/29/16 [phone]

## 2016-07-01 LAB — URINE CULTURE: Culture: >=100000 — AB

## 2016-07-02 ENCOUNTER — Telehealth (HOSPITAL_BASED_OUTPATIENT_CLINIC_OR_DEPARTMENT_OTHER): Payer: Self-pay | Admitting: Emergency Medicine

## 2016-07-02 NOTE — Telephone Encounter (Signed)
Post ED Visit - Positive Culture Follow-up  Culture report reviewed by antimicrobial stewardship pharmacist:  []  Sonya Frazier, Pharm.D. []  Sonya Frazier, Pharm.D., BCPS []  Sonya Frazier, Pharm.D. []  Sonya Frazier, Pharm.D., BCPS []  Sonya Frazier, [address] BCPS, AAHIVP []  Sonya Frazier, Pharm.D., BCPS, AAHIVP []  Sonya Frazier, Pharm.D. []  Sonya Frazier, [address] Sonya Frazier PharmD  Positive urine culture Treated with cephalexin, organism sensitive to the same and no further patient follow-up is required at this time.  Berle MullMiller, [name] 07/02/2016, 1:17 PM

## 2016-08-21 ENCOUNTER — Encounter (HOSPITAL_COMMUNITY): Payer: Self-pay | Admitting: *Deleted

## 2016-08-21 ENCOUNTER — Inpatient Hospital Stay (HOSPITAL_COMMUNITY): Payer: Self-pay

## 2016-08-21 ENCOUNTER — Inpatient Hospital Stay (HOSPITAL_COMMUNITY)
Admission: AD | Admit: 2016-08-21 | Discharge: 2016-08-21 | Disposition: A | Discharge status: Home / Self Care | Payer: Self-pay | Source: Ambulatory Visit | Attending: Obstetrics and Gynecology | Admitting: Obstetrics and Gynecology

## 2016-08-21 DIAGNOSIS — Z3A01 Less than 8 weeks gestation of pregnancy: Secondary | ICD-10-CM | POA: Insufficient documentation

## 2016-08-21 DIAGNOSIS — O3680X0 Pregnancy with inconclusive fetal viability, not applicable or unspecified: Secondary | ICD-10-CM

## 2016-08-21 DIAGNOSIS — Z87891 Personal history of nicotine dependence: Secondary | ICD-10-CM | POA: Insufficient documentation

## 2016-08-21 DIAGNOSIS — O4691 Antepartum hemorrhage, unspecified, first trimester: Secondary | ICD-10-CM

## 2016-08-21 DIAGNOSIS — O209 Hemorrhage in early pregnancy, unspecified: Secondary | ICD-10-CM | POA: Insufficient documentation

## 2016-08-21 DIAGNOSIS — R109 Unspecified abdominal pain: Secondary | ICD-10-CM | POA: Insufficient documentation

## 2016-08-21 DIAGNOSIS — O26892 Other specified pregnancy related conditions, second trimester: Secondary | ICD-10-CM | POA: Insufficient documentation

## 2016-08-21 DIAGNOSIS — Z79899 Other long term (current) drug therapy: Secondary | ICD-10-CM | POA: Insufficient documentation

## 2016-08-21 LAB — CBC
HCT: 34.1 % — ABNORMAL LOW (ref 36.0–46.0)
Hemoglobin: 11.4 g/dL — ABNORMAL LOW (ref 12.0–15.0)
MCH: 27 pg (ref 26.0–34.0)
MCHC: 33.4 g/dL (ref 30.0–36.0)
MCV: 80.6 fL (ref 78.0–100.0)
PLATELETS: 347 10*3/uL (ref 150–400)
RBC: 4.23 MIL/uL (ref 3.87–5.11)
RDW: 13.7 % (ref 11.5–15.5)
WBC: 12.2 10*3/uL — ABNORMAL HIGH (ref 4.0–10.5)

## 2016-08-21 LAB — WET PREP, GENITAL
SPERM: NONE SEEN
Trich, Wet Prep: NONE SEEN
YEAST WET PREP: NONE SEEN

## 2016-08-21 LAB — URINALYSIS, ROUTINE W REFLEX MICROSCOPIC
BILIRUBIN URINE: NEGATIVE
Glucose, UA: NEGATIVE mg/dL
KETONES UR: NEGATIVE mg/dL
Leukocytes, UA: NEGATIVE
NITRITE: NEGATIVE
Protein, ur: NEGATIVE mg/dL
Specific Gravity, Urine: >1.03 — ABNORMAL HIGH (ref 1.005–1.030)
pH: 6 (ref 5.0–8.0)

## 2016-08-21 LAB — POCT PREGNANCY, URINE: PREG TEST UR: POSITIVE — AB

## 2016-08-21 LAB — HCG, QUANTITATIVE, PREGNANCY: HCG, BETA CHAIN, QUANT, S: 4947 m[IU]/mL — AB (ref <5)

## 2016-08-21 LAB — ABO/RH: ABO/RH(D): A POS

## 2016-08-21 LAB — URINALYSIS, MICROSCOPIC (REFLEX)

## 2016-08-21 NOTE — Discharge Instructions (Signed)
Return to care  °· If you have heavier bleeding that soaks through more that 2 pads per hour for an hour or more °· If you bleed so much that you feel like you might pass out or you do pass out °· If you have significant abdominal pain that is not improved with Tylenol  °· If you develop a fever > 100.5 ° ° ° °Vaginal Bleeding During Pregnancy, First Trimester °A small amount of bleeding (spotting) from the vagina is relatively common in early pregnancy. It usually stops on its own. Various things may cause bleeding or spotting in early pregnancy. Some bleeding may be related to the pregnancy, and some may not. In most cases, the bleeding is normal and is not a problem. However, bleeding can also be a sign of something serious. Be sure to tell your health care provider about any vaginal bleeding right away. °Some possible causes of vaginal bleeding during the first trimester include: °· Infection or inflammation of the cervix. °· Growths (polyps) on the cervix. °· Miscarriage or threatened miscarriage. °· Pregnancy tissue has developed outside of the uterus and in a fallopian tube (tubal pregnancy). °· Tiny cysts have developed in the uterus instead of pregnancy tissue (molar pregnancy). °Follow these instructions at home: °Watch your condition for any changes. The following actions may help to lessen any discomfort you are feeling: °· Follow your health care provider's instructions for limiting your activity. If your health care provider orders bed rest, you may need to stay in bed and only get up to use the bathroom. However, your health care provider may allow you to continue light activity. °· If needed, make plans for someone to help with your regular activities and responsibilities while you are on bed rest. °· Keep track of the number of pads you use each day, how often you change pads, and how soaked (saturated) they are. Write this down. °· Do not use tampons. Do not douche. °· Do not have sexual  intercourse or orgasms until approved by your health care provider. °· If you pass any tissue from your vagina, save the tissue so you can show it to your health care provider. °· Only take over-the-counter or prescription medicines as directed by your health care provider. °· Do not take aspirin because it can make you bleed. °· Keep all follow-up appointments as directed by your health care provider. °Contact a health care provider if: °· You have any vaginal bleeding during any part of your pregnancy. °· You have cramps or labor pains. °· You have a fever, not controlled by medicine. °Get help right away if: °· You have severe cramps in your back or belly (abdomen). °· You pass large clots or tissue from your vagina. °· Your bleeding increases. °· You feel light-headed or weak, or you have fainting episodes. °· You have chills. °· You are leaking fluid or have a gush of fluid from your vagina. °· You pass out while having a bowel movement. °This information is not intended to replace advice given to you by your health care provider. Make sure you discuss any questions you have with your health care provider. °Document Released: 04/29/2005 Document Revised: 12/26/2015 Document Reviewed: 03/27/2013 °Elsevier Interactive Patient Education © 2017 [employer] ° °

## 2016-08-21 NOTE — MAU Note (Signed)
Took 2 tests, Sat and Sunday morning, was both positive.  Started spotting yesterday, has continued, gotten a litle bit heavier today.

## 2016-08-21 NOTE — MAU Provider Note (Signed)
History     CSN: [id number]  Arrival date and time: 08/21/16 1612   First Provider Initiated Contact with Patient 08/21/16 1913      Chief Complaint  Patient presents with  . Possible Pregnancy  . Abdominal Cramping  . Vaginal Bleeding   HPI  Sonya Frazier is a [age] y.o. G1P0000 at 3450w4d by LMP who presents with vaginal bleeding & abdominal cramping. Reports positive HPT last Saturday. Vaginal bleeding began yesterday as light red spotting. Heavier bleeding today; states heavy like a period. Has not saturated pads. Passed a few small clots. Also reports lower abdominal cramping today. Rates pain 3/10. Has not treated pain.  Denies n/v/d, constipation, vaginal discharge, dysuria, or recent intercourse.   OB History    Gravida Para Term Preterm AB Living   1 0 0 0 0 0   SAB TAB Ectopic Multiple Live Births   0 0 0 0        Past Medical History:  Diagnosis Date  . Medical history non-contributory     Past Surgical History:  Procedure Laterality Date  . WISDOM TOOTH EXTRACTION      Family History  Problem Relation Age of Onset  . Asthma Mother   . Cancer Maternal Aunt     Social History  Substance Use Topics  . Smoking status: Former Games developermoker  . Smokeless tobacco: Never Used  . Alcohol use Yes     Comment: occ    Allergies: No Known Allergies  Prescriptions Prior to Admission  Medication Sig Dispense Refill Last Dose  . cephALEXin (KEFLEX) 500 MG capsule Take 1 capsule (500 mg total) by mouth 3 (three) times daily. 21 capsule 0     Review of Systems  Constitutional: Negative.   Gastrointestinal: Positive for abdominal pain. Negative for constipation, diarrhea, nausea and vomiting.  Genitourinary: Positive for vaginal bleeding. Negative for vaginal discharge.   Physical Exam   Blood pressure 120/82, pulse 94, temperature 99.6 F (37.6 C), temperature source Oral, resp. rate 16, weight 132 lb 4 oz (60 kg), last menstrual period 07/07/2016.  Physical Exam   Nursing note and vitals reviewed. Constitutional: She is oriented to person, place, and time. She appears well-developed and well-nourished. No distress.  HENT:  Head: Normocephalic and atraumatic.  Eyes: Conjunctivae are normal. Right eye exhibits no discharge. Left eye exhibits no discharge. No scleral icterus.  Neck: Normal range of motion.  Respiratory: Effort normal. No respiratory distress.  GI: Soft. She exhibits no distension. There is no tenderness.  Genitourinary: Uterus normal. Cervix exhibits no motion tenderness and no friability. There is bleeding (moderate amount of dark red blood) in the vagina.  Genitourinary Comments: Cervix closed  Neurological: She is alert and oriented to person, place, and time.  Skin: Skin is warm and dry. She is not diaphoretic.  Psychiatric: She has a normal mood and affect. Her behavior is normal. Judgment and thought content normal.    MAU Course  Procedures Results for orders placed or performed during the hospital encounter of 08/21/16 (from the past 24 hour(s))  Urinalysis, Routine w reflex microscopic     Status: Abnormal   Collection Time: 08/21/16  6:40 PM  Result Value Ref Range   Color, Urine YELLOW YELLOW   APPearance CLEAR CLEAR   Specific Gravity, Urine >1.030 (H) 1.005 - 1.030   pH 6.0 5.0 - 8.0   Glucose, UA NEGATIVE NEGATIVE mg/dL   Hgb urine dipstick LARGE (A) NEGATIVE   Bilirubin Urine NEGATIVE NEGATIVE  Ketones, ur NEGATIVE NEGATIVE mg/dL   Protein, ur NEGATIVE NEGATIVE mg/dL   Nitrite NEGATIVE NEGATIVE   Leukocytes, UA NEGATIVE NEGATIVE  Urinalysis, Microscopic (reflex)     Status: Abnormal   Collection Time: 08/21/16  6:40 PM  Result Value Ref Range   RBC / HPF TOO NUMEROUS TO COUNT 0 - 5 RBC/hpf   WBC, UA 0-5 0 - 5 WBC/hpf   Bacteria, UA FEW (A) NONE SEEN   Squamous Epithelial / LPF 0-5 (A) NONE SEEN  Pregnancy, urine POC     Status: Abnormal   Collection Time: 08/21/16  6:54 PM  Result Value Ref Range   Preg  Test, Ur POSITIVE (A) NEGATIVE  CBC     Status: Abnormal   Collection Time: 08/21/16  7:10 PM  Result Value Ref Range   WBC 12.2 (H) 4.0 - 10.5 K/uL   RBC 4.23 3.87 - 5.11 MIL/uL   Hemoglobin 11.4 (L) 12.0 - 15.0 g/dL   HCT 16.1 (L) 09.6 - 04.5 %   MCV 80.6 78.0 - 100.0 fL   MCH 27.0 26.0 - 34.0 pg   MCHC 33.4 30.0 - 36.0 g/dL   RDW 40.9 81.1 - 91.4 %   Platelets 347 150 - 400 K/uL  ABO/Rh     Status: None   Collection Time: 08/21/16  7:10 PM  Result Value Ref Range   ABO/RH(D) A POS   hCG, quantitative, pregnancy     Status: Abnormal   Collection Time: 08/21/16  7:10 PM  Result Value Ref Range   hCG, Beta Chain, Quant, S 4,947 (H) <5 mIU/mL  HIV antibody     Status: None   Collection Time: 08/21/16  7:10 PM  Result Value Ref Range   HIV Screen 4th Generation wRfx Non Reactive Non Reactive  Wet prep, genital     Status: Abnormal   Collection Time: 08/21/16  7:26 PM  Result Value Ref Range   Yeast Wet Prep HPF POC NONE SEEN NONE SEEN   Trich, Wet Prep NONE SEEN NONE SEEN   Clue Cells Wet Prep HPF POC PRESENT (A) NONE SEEN   WBC, Wet Prep HPF POC MODERATE (A) NONE SEEN   Sperm NONE SEEN    US Ob Comp Less 14 Wks  Result Date: 08/21/2016 CLINICAL DATA:  Spotting. Estimated gestational age by last menstrual period equals 6 weeks 3 days EXAM: OBSTETRIC <14 WK [place] AND TRANSVAGINAL OB US TECHNIQUE: Both transabdominal and transvaginal ultrasound examinations were performed for complete evaluation of the gestation as well as the maternal uterus, adnexal regions, and pelvic cul-de-sac. Transvaginal technique was performed to assess early pregnancy. COMPARISON:  None. FINDINGS: Intrauterine gestational sac: Not identified Yolk sac:  Not identified Embryo:  Not identified Subchorionic hemorrhage:  None visualized. Maternal uterus/adnexae: The uterus and ovaries normal. There is some thickening of the endometrium. No free fluid. IMPRESSION: No intrauterine gestational sac, yolk sac, or fetal  pole identified. Differential considerations include intrauterine pregnancy too early to be sonographically visualized, missed abortion, or ectopic pregnancy. Followup ultrasound is recommended in 10-14 days for further evaluation. Electronically Signed   By: Genevive Bi M.D.   On: 08/21/2016 20:07   US Ob Transvaginal  Result Date: 08/21/2016 CLINICAL DATA:  Spotting. Estimated gestational age by last menstrual period equals 6 weeks 3 days EXAM: OBSTETRIC <14 WK [place] AND TRANSVAGINAL OB US TECHNIQUE: Both transabdominal and transvaginal ultrasound examinations were performed for complete evaluation of the gestation as well as the maternal uterus, adnexal regions,  and pelvic cul-de-sac. Transvaginal technique was performed to assess early pregnancy. COMPARISON:  None. FINDINGS: Intrauterine gestational sac: Not identified Yolk sac:  Not identified Embryo:  Not identified Subchorionic hemorrhage:  None visualized. Maternal uterus/adnexae: The uterus and ovaries normal. There is some thickening of the endometrium. No free fluid. IMPRESSION: No intrauterine gestational sac, yolk sac, or fetal pole identified. Differential considerations include intrauterine pregnancy too early to be sonographically visualized, missed abortion, or ectopic pregnancy. Followup ultrasound is recommended in 10-14 days for further evaluation. Electronically Signed   By: Genevive Bi M.D.   On: 08/21/2016 20:07    MDM +UPT UA, wet prep, GC/chlamydia, CBC, ABO/Rh, quant hCG, HIV, and [place] today to rule out ectopic pregnancy A positive VSS Ultrasound shows no IUP or adnexal mass, BHCG >4000 Discussed results with Dr. Vergie Living. Will have patient return for repeat BHCG.   Assessment and Plan  A: 1. Pregnancy of unknown anatomic location   2. Vaginal bleeding in pregnancy, first trimester    P: Discharge home Return to [facility] Monday morning for repeat BHCG GC/CT pending Discussed reasons to return to MAU including s/s  of ectopic   Judeth Horn 08/21/2016, 7:11 PM

## 2016-08-22 LAB — HIV ANTIBODY (ROUTINE TESTING W REFLEX): HIV SCREEN 4TH GENERATION: NONREACTIVE

## 2016-08-24 ENCOUNTER — Ambulatory Visit: Payer: Medicaid Other

## 2016-08-24 ENCOUNTER — Telehealth (HOSPITAL_COMMUNITY): Payer: Self-pay | Admitting: *Deleted

## 2016-08-24 ENCOUNTER — Encounter: Payer: Self-pay | Admitting: Family Medicine

## 2016-08-24 DIAGNOSIS — O3680X0 Pregnancy with inconclusive fetal viability, not applicable or unspecified: Secondary | ICD-10-CM

## 2016-08-24 LAB — GC/CHLAMYDIA PROBE AMP (~~LOC~~) NOT AT ARMC
CHLAMYDIA, DNA PROBE: NEGATIVE
NEISSERIA GONORRHEA: POSITIVE — AB

## 2016-08-24 LAB — HCG, QUANTITATIVE, PREGNANCY: hCG, Beta Chain, Quant, S: 651 m[IU]/mL — ABNORMAL HIGH (ref <5)

## 2016-08-24 NOTE — Progress Notes (Addendum)
Patient presented to office today for HCG (STAT. Patient reports pain level of 10. She reports also passing some large amounts of clots. Patient was instructed to wait for her results. I have advised patient  if the pain becomes severe she may need to go back to MAU. Patient verbalizes understanding at this time.   Patient results are completed at this time 651. Patient reports not taking anything for pain since yesterday.Per Dr.Pickins patient needs to alternate between Motrin and Tyenol for pain. Patient voice understanding at this time. Patient was advised she will need to have weekly quant check until her hormone level is back to zero. Bleeding precaution have been given to patient. Appointment have been scheduled for patient on 08/31/2016 for a repeat beta.

## 2016-08-24 NOTE — Telephone Encounter (Signed)
Notified p.t of positive Chlamydia results. [facility] [address]. 1 g  Azithro. Po x1 dose.  Advised to refrain from intercourse x 2 weeks and to notify partner.  Form sent to Health dept.

## 2016-09-01 ENCOUNTER — Other Ambulatory Visit: Payer: Self-pay

## 2016-09-01 DIAGNOSIS — O3680X0 Pregnancy with inconclusive fetal viability, not applicable or unspecified: Secondary | ICD-10-CM

## 2016-09-02 ENCOUNTER — Telehealth: Payer: Self-pay | Admitting: *Deleted

## 2016-09-02 LAB — HCG, QUANTITATIVE, PREGNANCY: hCG, Beta Chain, Quant, S: 26.9 m[IU]/mL — ABNORMAL HIGH

## 2016-09-02 NOTE — Telephone Encounter (Signed)
Left message for pt stating that I am calling with test results and recommendation from the doctor. Please call back and state whether a detailed message can be left on voice mail. Per Dr. Vergie LivingPickens, pt needs weekly BHCG until the level is zero. Her level from yesterday had decreased to 26.9.

## 2016-09-03 NOTE — Telephone Encounter (Signed)
Called pt and informed pt that the provider wants her to come in to make sure her levels are zero.  I asked pt if she could come in for a rpt beta draw on 09/08/16.  Pt stated that she would be able to come in @ 1430 on Feb 6th.  Front office notified to schedule appt.

## 2016-09-08 ENCOUNTER — Other Ambulatory Visit: Payer: Medicaid Other

## 2016-09-08 DIAGNOSIS — O039 Complete or unspecified spontaneous abortion without complication: Secondary | ICD-10-CM

## 2016-09-09 ENCOUNTER — Encounter: Payer: Self-pay | Admitting: *Deleted

## 2016-09-09 ENCOUNTER — Telehealth: Payer: Self-pay | Admitting: *Deleted

## 2016-09-09 LAB — HCG, QUANTITATIVE, PREGNANCY: hCG, Beta Chain, Quant, S: 4.3 m[IU]/mL

## 2016-09-09 NOTE — Telephone Encounter (Signed)
Per Dr. Vergie LivingPickens, pt's quant is negative. She can expect a period in 4-6 weeks and if she does not, she should call our office. I called pt and left message stating that I am calling with test results. Please call back and state whether a detailed message can be left on her voice mail.

## 2016-09-10 NOTE — Telephone Encounter (Signed)
LM for pt that this is our second attempt if she continues to have questions, comments, or concerns to please give the office a call and a letter will be sent.   Letter sent.

## 2016-12-31 ENCOUNTER — Inpatient Hospital Stay (HOSPITAL_COMMUNITY): Payer: Self-pay

## 2016-12-31 ENCOUNTER — Inpatient Hospital Stay (HOSPITAL_COMMUNITY)
Admission: AD | Admit: 2016-12-31 | Discharge: 2016-12-31 | Disposition: A | Discharge status: Home / Self Care | Payer: Self-pay | Source: Ambulatory Visit | Attending: Family Medicine | Admitting: Family Medicine

## 2016-12-31 ENCOUNTER — Encounter (HOSPITAL_COMMUNITY): Payer: Self-pay | Admitting: Obstetrics and Gynecology

## 2016-12-31 DIAGNOSIS — O26891 Other specified pregnancy related conditions, first trimester: Secondary | ICD-10-CM | POA: Insufficient documentation

## 2016-12-31 DIAGNOSIS — R110 Nausea: Secondary | ICD-10-CM | POA: Insufficient documentation

## 2016-12-31 DIAGNOSIS — O26899 Other specified pregnancy related conditions, unspecified trimester: Secondary | ICD-10-CM

## 2016-12-31 DIAGNOSIS — Z3A00 Weeks of gestation of pregnancy not specified: Secondary | ICD-10-CM | POA: Insufficient documentation

## 2016-12-31 DIAGNOSIS — R109 Unspecified abdominal pain: Secondary | ICD-10-CM | POA: Insufficient documentation

## 2016-12-31 LAB — CBC WITH DIFFERENTIAL/PLATELET
BASOS PCT: 0 %
Basophils Absolute: 0 10*3/uL (ref 0.0–0.1)
EOS ABS: 0.1 10*3/uL (ref 0.0–0.7)
Eosinophils Relative: 1 %
HEMATOCRIT: 33.4 % — AB (ref 36.0–46.0)
HEMOGLOBIN: 11 g/dL — AB (ref 12.0–15.0)
LYMPHS ABS: 3.4 10*3/uL (ref 0.7–4.0)
Lymphocytes Relative: 27 %
MCH: 26.4 pg (ref 26.0–34.0)
MCHC: 32.9 g/dL (ref 30.0–36.0)
MCV: 80.1 fL (ref 78.0–100.0)
Monocytes Absolute: 0.6 10*3/uL (ref 0.1–1.0)
Monocytes Relative: 5 %
NEUTROS ABS: 8.4 10*3/uL — AB (ref 1.7–7.7)
NEUTROS PCT: 67 %
Platelets: 371 10*3/uL (ref 150–400)
RBC: 4.17 MIL/uL (ref 3.87–5.11)
RDW: 14.2 % (ref 11.5–15.5)
WBC: 12.5 10*3/uL — AB (ref 4.0–10.5)

## 2016-12-31 LAB — URINALYSIS, ROUTINE W REFLEX MICROSCOPIC
Bacteria, UA: NONE SEEN
Bilirubin Urine: NEGATIVE
Glucose, UA: NEGATIVE mg/dL
Hgb urine dipstick: NEGATIVE
KETONES UR: NEGATIVE mg/dL
Leukocytes, UA: NEGATIVE
Nitrite: NEGATIVE
PH: 6 (ref 5.0–8.0)
Protein, ur: 30 mg/dL — AB
Specific Gravity, Urine: 1.031 — ABNORMAL HIGH (ref 1.005–1.030)

## 2016-12-31 LAB — HCG, QUANTITATIVE, PREGNANCY: HCG, BETA CHAIN, QUANT, S: 4598 m[IU]/mL — AB (ref <5)

## 2016-12-31 LAB — POCT PREGNANCY, URINE: Preg Test, Ur: POSITIVE — AB

## 2016-12-31 MED ORDER — CONCEPT OB 130-92.4-1 MG PO CAPS
1.0000 | ORAL_CAPSULE | Freq: Every day | ORAL | 2 refills | Status: DC
Start: 2016-12-31 — End: 2017-03-09

## 2016-12-31 NOTE — Progress Notes (Addendum)
G2P0. + home pregnancy. Presents to triage for nausea. Denies LOF or bleeding. April 27 last period.   2150: MD at bs assessing pt and for bs U/S.   2202: transvaginal U/S done by Dr. Shawnie PonsPratt at bs.   2215: Discharge instructions given to pt by Dr. Shawnie PonsPratt. Questions asked by pt and MD provided explanation.

## 2016-12-31 NOTE — MAU Provider Note (Signed)
[facility] Attending MAU Note  Chief Complaint: Nausea    First Provider Initiated Contact with Patient 12/31/16 2207      SUBJECTIVE Deniece ReeJennell K Vertz is a 21 y.o. G2P0010 at 4 wks by LMP who presents with nausea, breast tenderness and + UPT at home. Denies fever, chills, dysuria, vaginal discharge, vaginal bleeding, or abdominal pain.  Past Medical History:  Diagnosis Date  . Medical history non-contributory    OB History  Gravida Para Term Preterm AB Living  2 0 0 0 1 0  SAB TAB Ectopic Multiple Live Births  1 0 0 0      # Outcome Date GA Lbr Len/2nd Weight Sex Delivery Anes PTL Lv  2 Current           1 SAB  2147w0d            Past Surgical History:  Procedure Laterality Date  . WISDOM TOOTH EXTRACTION     Social History   Social History  . Marital status: Single    Spouse name: N/A  . Number of children: N/A  . Years of education: N/A   Occupational History  . Not on file.   Social History Main Topics  . Smoking status: Former Games developermoker  . Smokeless tobacco: Never Used  . Alcohol use Yes     Comment: occ  . Drug use: No     Comment: Quit approx 1 year ago  . Sexual activity: Yes    Birth control/ protection: None   Other Topics Concern  . Not on file   Social History Narrative  . No narrative on file   No current facility-administered medications on file prior to encounter.    No current outpatient prescriptions on file prior to encounter.   No Known Allergies  ROS: Pertinent items in HPI  OBJECTIVE BP 112/65 (BP Location: Right Arm)   Pulse 87   Temp 97.9 F (36.6 C) (Oral)   Resp 20   Ht 5\' 1"  (1.549 m)   Wt 146 lb 12 oz (66.6 kg)   LMP 11/27/2016   BMI 27.73 kg/m  CONSTITUTIONAL: Well-developed, well-nourished female in no acute distress.  HENT:  Normocephalic, atraumatic, External right and left ear normal. Oropharynx is clear and moist EYES: Conjunctivae and EOM are normal. . No scleral icterus.  NECK: Normal range of  motion, supple, no masses.  Normal thyroid.  SKIN: Skin is warm and dry. No rash noted. NEUROLGIC: Alert and oriented to person, place, and time. PSYCHIATRIC: Normal mood and affect. Normal behavior. Normal judgment and thought content. CARDIOVASCULAR: Normal heart rate noted RESPIRATORY: Effort normal ABDOMEN: Soft, normal bowel sounds, no distention noted.  No tenderness, rebound or guarding.  PELVIC: Normal appearing external genitalia; normal appearing vaginal mucosa  MUSCULOSKELETAL: Normal range of motion. No tenderness.    LAB RESULTS Results for orders placed or performed during the hospital encounter of 12/31/16 (from the past 48 hour(s))  Urinalysis, Routine w reflex microscopic     Status: Abnormal   Collection Time: 12/31/16  7:37 PM  Result Value Ref Range   Color, Urine YELLOW YELLOW   APPearance CLEAR CLEAR   Specific Gravity, Urine 1.031 (H) 1.005 - 1.030   pH 6.0 5.0 - 8.0   Glucose, UA NEGATIVE NEGATIVE mg/dL   Hgb urine dipstick NEGATIVE NEGATIVE   Bilirubin Urine NEGATIVE NEGATIVE   Ketones, ur NEGATIVE NEGATIVE mg/dL   Protein, ur 30 (A) NEGATIVE mg/dL   Nitrite NEGATIVE NEGATIVE  Leukocytes, UA NEGATIVE NEGATIVE   RBC / HPF 0-5 0 - 5 RBC/hpf   WBC, UA 0-5 0 - 5 WBC/hpf   Bacteria, UA NONE SEEN NONE SEEN   Squamous Epithelial / LPF 0-5 (A) NONE SEEN   Mucous PRESENT   Pregnancy, urine POC     Status: Abnormal   Collection Time: 12/31/16  7:42 PM  Result Value Ref Range   Preg Test, Ur POSITIVE (A) NEGATIVE    Comment:        THE SENSITIVITY OF THIS METHODOLOGY IS >24 mIU/mL   CBC with Differential/Platelet     Status: Abnormal   Collection Time: 12/31/16  8:05 PM  Result Value Ref Range   WBC 12.5 (H) 4.0 - 10.5 K/uL   RBC 4.17 3.87 - 5.11 MIL/uL   Hemoglobin 11.0 (L) 12.0 - 15.0 g/dL   HCT 16.1 (L) 09.6 - 04.5 %   MCV 80.1 78.0 - 100.0 fL   MCH 26.4 26.0 - 34.0 pg   MCHC 32.9 30.0 - 36.0 g/dL   RDW 40.9 81.1 - 91.4 %   Platelets 371 150 - 400  K/uL   Neutrophils Relative % 67 %   Neutro Abs 8.4 (H) 1.7 - 7.7 K/uL   Lymphocytes Relative 27 %   Lymphs Abs 3.4 0.7 - 4.0 K/uL   Monocytes Relative 5 %   Monocytes Absolute 0.6 0.1 - 1.0 K/uL   Eosinophils Relative 1 %   Eosinophils Absolute 0.1 0.0 - 0.7 K/uL   Basophils Relative 0 %   Basophils Absolute 0.0 0.0 - 0.1 K/uL  hCG, quantitative, pregnancy     Status: Abnormal   Collection Time: 12/31/16  8:05 PM  Result Value Ref Range   hCG, Beta Chain, Quant, S 4,598 (H) <5 mIU/mL    Comment:          GEST. AGE      CONC.  (mIU/mL)   <=1 WEEK        5 - 50     2 WEEKS       50 - 500     3 WEEKS       100 - 10,000     4 WEEKS     1,000 - 30,000     5 WEEKS     3,500 - 115,000   6-8 WEEKS     12,000 - 270,000    12 WEEKS     15,000 - 220,000        FEMALE AND NON-PREGNANT FEMALE:     LESS THAN 5 mIU/mL     IMAGING US Ob Comp Less 14 Wks  Result Date: 12/31/2016 CLINICAL DATA:  21 year old female with 1 day of lower abdominal, pelvic pain. Quantitative beta HCG pending. Gestational age by LMP 4 weeks 6 days. EXAM: OBSTETRIC <14 WK [place] AND TRANSVAGINAL OB US TECHNIQUE: Both transabdominal and transvaginal ultrasound examinations were performed for complete evaluation of the gestation as well as the maternal uterus, adnexal regions, and pelvic cul-de-sac. Transvaginal technique was performed to assess early pregnancy. COMPARISON:  08/21/2016 FINDINGS: Intrauterine gestational sac: Small probable gestational sac at the uterine fundus (image 20). Yolk sac:  Questionably identified on image 35. Embryo:  Not evident Cardiac Activity: Not applicable MSD: 3.5  mm   5 w   0  d Subchorionic hemorrhage:  None visualized. Maternal uterus/adnexae: The left ovary is normal measuring 1.4 x 2.3 x 1.6 cm. The right ovary is normal measuring 2.8 x 2.1 x  2.4 cm. No pelvic free fluid. IMPRESSION: Probable early intrauterine gestational sac, but no definite yolk sac, and no fetal pole, or cardiac activity  yet visualized. Recommend follow-up quantitative B-HCG levels and follow-up US in 14 days to assess viability. This recommendation follows SRU consensus guidelines: Diagnostic Criteria for Nonviable Pregnancy Early in the First Trimester. Malva Limes Med 2013; 147:8295-62. Ovaries appear normal.  No pelvic free fluid. Electronically Signed   By: Odessa Fleming M.D.   On: 12/31/2016 20:38   US Ob Transvaginal  Result Date: 12/31/2016 CLINICAL DATA:  21 year old female with 1 day of lower abdominal, pelvic pain. Quantitative beta HCG pending. Gestational age by LMP 4 weeks 6 days. EXAM: OBSTETRIC <14 WK [place] AND TRANSVAGINAL OB US TECHNIQUE: Both transabdominal and transvaginal ultrasound examinations were performed for complete evaluation of the gestation as well as the maternal uterus, adnexal regions, and pelvic cul-de-sac. Transvaginal technique was performed to assess early pregnancy. COMPARISON:  08/21/2016 FINDINGS: Intrauterine gestational sac: Small probable gestational sac at the uterine fundus (image 20). Yolk sac:  Questionably identified on image 35. Embryo:  Not evident Cardiac Activity: Not applicable MSD: 3.5  mm   5 w   0  d Subchorionic hemorrhage:  None visualized. Maternal uterus/adnexae: The left ovary is normal measuring 1.4 x 2.3 x 1.6 cm. The right ovary is normal measuring 2.8 x 2.1 x 2.4 cm. No pelvic free fluid. IMPRESSION: Probable early intrauterine gestational sac, but no definite yolk sac, and no fetal pole, or cardiac activity yet visualized. Recommend follow-up quantitative B-HCG levels and follow-up US in 14 days to assess viability. This recommendation follows SRU consensus guidelines: Diagnostic Criteria for Nonviable Pregnancy Early in the First Trimester. Malva Limes Med 2013; 130:8657-84. Ovaries appear normal.  No pelvic free fluid. Electronically Signed   By: Odessa Fleming M.D.   On: 12/31/2016 20:38    MAU COURSE  ASSESSMENT 1. Pregnancy related nausea, antepartum   2. Abdominal pain  affecting pregnancy, antepartum     PLAN Discharge home Ginger ale, frequent snacks to avoid nausea Probable IUP with likely small yolk sac-->begin Prenatal care list of offices given.  Allergies as of 12/31/2016   No Known Allergies     Medication List    TAKE these medications   CONCEPT OB 130-92.4-1 MG Caps Take 1 capsule by mouth daily.        Reva Bores, MD 12/31/2016 10:20 PM

## 2016-12-31 NOTE — Discharge Instructions (Signed)
[facility]    [facility]       Phone: [phone]  [facility] [place] Phone: [phone]  [facility] [place]  Phone: [phone]  [facility] [place]  Phone: [phone]  [facility] [place]  Phone: [phone]  [facility]       Phone: [phone]  [facility]    Phone: [phone]   [facility]    Phone: [phone]  [facility]    Phone: [phone]  [facility]    Phone: [phone]  [facility]    Phone: [phone]  [facility]       Phone: [phone]   [facility]  Phone: [phone]  [facility]    Phone: [phone]  [facility] [place]   Phone: [phone]  [facility]      Phone: [phone]  [facility]    Phone: [phone]    Abdominal Pain During Pregnancy Belly (abdominal) pain is common during pregnancy. Most of the time, it is not a serious problem. Other times, it can be a sign that something is wrong with the pregnancy. Always tell your doctor if you have belly pain. Follow these instructions at home: Monitor your belly pain for any changes. The following actions may help you feel better:  Do not have sex (intercourse) or put anything in your vagina until you feel better.  Rest until your pain stops.  Drink clear fluids if you feel sick to your stomach (nauseous). Do not eat solid food until you feel better.  Only take medicine as told by your doctor.  Keep all doctor visits as told.  Get help right away if:  You are bleeding, leaking fluid, or pieces of tissue come out of your vagina.  You have more pain  or cramping.  You keep throwing up (vomiting).  You have pain when you pee (urinate) or have blood in your pee.  You have a fever.  You do not feel your baby moving as much.  You feel very weak or feel like passing out.  You have trouble breathing, with or without belly pain.  You have a very bad headache and belly pain.  You have fluid leaking from your vagina and belly pain.  You keep having watery poop (diarrhea).  Your belly pain does not go away after resting, or the pain gets worse. This information is not intended to replace advice given to you by your health care provider. Make sure you discuss any questions you have with your health care provider. Document Released: 07/08/2009 Document Revised: 02/26/2016 Document Reviewed: 02/16/2013 Elsevier Interactive Patient Education  2018 [employer]  First Trimester of Pregnancy The first trimester of pregnancy is from week 1 until the end of week 13 (months 1 through 3). During this time, your baby will begin to develop inside you. At 6-8 weeks, the eyes and face are formed, and the heartbeat can be seen on ultrasound. At the end of 12 weeks, all the baby's organs are formed. Prenatal care is all the medical care you receive before the birth of your baby. Make sure you get good prenatal care and follow all of your doctor's instructions. Follow these instructions at home: Medicines  Take over-the-counter and prescription medicines only as told by your doctor.  Some medicines are safe and some medicines are not safe during pregnancy.  Take a prenatal vitamin that contains at least 600 micrograms (mcg) of folic acid.  If you have trouble pooping (constipation), take medicine that will make your stool soft (stool softener) if your doctor approves. Eating and drinking  Eat regular, healthy meals.  Your doctor will tell you the amount of weight gain that is right for you.  Avoid raw meat and uncooked cheese.  If you feel sick  to your stomach (nauseous) or throw up (vomit): ? Eat 4 or 5 small meals a day instead of 3 large meals. ? Try eating a few soda crackers. ? Drink liquids between meals instead of during meals.  To prevent constipation: ? Eat foods that are high in fiber, like fresh fruits and vegetables, whole grains, and beans. ? Drink enough fluids to keep your pee (urine) clear or pale yellow. Activity  Exercise only as told by your doctor. Stop exercising if you have cramps or pain in your lower belly (abdomen) or low back.  Do not exercise if it is too hot, too humid, or if you are in a place of great height (high altitude).  Try to avoid standing for long periods of time. Move your legs often if you must stand in one place for a long time.  Avoid heavy lifting.  Wear low-heeled shoes. Sit and stand up straight.  You can have sex unless your doctor tells you not to. Relieving pain and discomfort  Wear a good support bra if your breasts are sore.  Take warm water baths (sitz baths) to soothe pain or discomfort caused by hemorrhoids. Use hemorrhoid cream if your doctor says it is okay.  Rest with your legs raised if you have leg cramps or low back pain.  If you have puffy, bulging veins (varicose veins) in your legs: ? Wear support hose or compression stockings as told by your doctor. ? Raise (elevate) your feet for 15 minutes, 3-4 times a day. ? Limit salt in your food. Prenatal care  Schedule your prenatal visits by the twelfth week of pregnancy.  Write down your questions. Take them to your prenatal visits.  Keep all your prenatal visits as told by your doctor. This is important. Safety  Wear your seat belt at all times when driving.  Make a list of emergency phone numbers. The list should include numbers for family, friends, the hospital, and police and fire departments. General instructions  Ask your doctor for a referral to a local prenatal class. Begin classes no later than  at the start of month 6 of your pregnancy.  Ask for help if you need counseling or if you need help with nutrition. Your doctor can give you advice or tell you where to go for help.  Do not use hot tubs, steam rooms, or saunas.  Do not douche or use tampons or scented sanitary pads.  Do not cross your legs for long periods of time.  Avoid all herbs and alcohol. Avoid drugs that are not approved by your doctor.  Do not use any tobacco products, including cigarettes, chewing tobacco, and electronic cigarettes. If you need help quitting, ask your doctor. You may get counseling or other support to help you quit.  Avoid cat litter boxes and soil used by cats. These carry germs that can cause birth defects in the baby and can cause a loss of your baby (miscarriage) or stillbirth.  Visit your dentist. At home,  brush your teeth with a soft toothbrush. Be gentle when you floss. Contact a doctor if:  You are dizzy.  You have mild cramps or pressure in your lower belly.  You have a nagging pain in your belly area.  You continue to feel sick to your stomach, you throw up, or you have watery poop (diarrhea).  You have a bad smelling fluid coming from your vagina.  You have pain when you pee (urinate).  You have increased puffiness (swelling) in your face, hands, legs, or ankles. Get help right away if:  You have a fever.  You are leaking fluid from your vagina.  You have spotting or bleeding from your vagina.  You have very bad belly cramping or pain.  You gain or lose weight rapidly.  You throw up blood. It may look like coffee grounds.  You are around people who have [place] measles, fifth disease, or chickenpox.  You have a very bad headache.  You have shortness of breath.  You have any kind of trauma, such as from a fall or a car accident. Summary  The first trimester of pregnancy is from week 1 until the end of week 13 (months 1 through 3).  To take care of yourself  and your unborn baby, you will need to eat healthy meals, take medicines only if your doctor tells you to do so, and do activities that are safe for you and your baby.  Keep all follow-up visits as told by your doctor. This is important as your doctor will have to ensure that your baby is healthy and growing well. This information is not intended to replace advice given to you by your health care provider. Make sure you discuss any questions you have with your health care provider. Document Released: 01/06/2008 Document Revised: 07/28/2016 Document Reviewed: 07/28/2016 Elsevier Interactive Patient Education  2017 [employer].

## 2016-12-31 NOTE — MAU Note (Signed)
PT   SAYS SHE WAS PREG -   BUT HAD SAB ON  FEB OR MARCH.     SHE TOOK UPT  AT HOME  5-28 - POSITIVE .  LMP- 4-27.      FEELS PAIN IN LOWER BAD - STARTED YESTERDAY .   NO MED  FOR PAIN.    FEELS  NAUSEA  SOMETIMES.       HAS H/A - STARTED ON 5-21-  TOOK  BC POWDER  -  WENT AWAY  THEN RETURNED  ON 5-25-   NO MEDS-  SLEPT - AND  H/A  GONE    NO  H/A  NOW.     NO BIRTH CONTROL-       LAST SEX-   YESTERDAY  .

## 2017-01-01 LAB — RPR: RPR Ser Ql: NONREACTIVE

## 2017-01-01 LAB — HIV ANTIBODY (ROUTINE TESTING W REFLEX): HIV Screen 4th Generation wRfx: NONREACTIVE

## 2017-01-13 ENCOUNTER — Encounter: Payer: Self-pay | Admitting: Obstetrics

## 2017-02-09 ENCOUNTER — Encounter: Payer: Self-pay | Admitting: Certified Nurse Midwife

## 2017-02-09 ENCOUNTER — Other Ambulatory Visit (HOSPITAL_COMMUNITY)
Admission: RE | Admit: 2017-02-09 | Discharge: 2017-02-09 | Disposition: A | Discharge status: Home / Self Care | Payer: Medicaid Other | Source: Ambulatory Visit | Attending: Certified Nurse Midwife | Admitting: Certified Nurse Midwife

## 2017-02-09 ENCOUNTER — Ambulatory Visit (INDEPENDENT_AMBULATORY_CARE_PROVIDER_SITE_OTHER): Payer: Medicaid Other | Admitting: Certified Nurse Midwife

## 2017-02-09 VITALS — BP 133/64 | HR 92 | Wt 144.0 lb

## 2017-02-09 DIAGNOSIS — Z3480 Encounter for supervision of other normal pregnancy, unspecified trimester: Secondary | ICD-10-CM

## 2017-02-09 DIAGNOSIS — Z3481 Encounter for supervision of other normal pregnancy, first trimester: Secondary | ICD-10-CM

## 2017-02-09 DIAGNOSIS — O219 Vomiting of pregnancy, unspecified: Secondary | ICD-10-CM

## 2017-02-09 MED ORDER — DOXYLAMINE-PYRIDOXINE 10-10 MG PO TBEC: DELAYED_RELEASE_TABLET | ORAL | 4 refills | Status: DC

## 2017-02-09 MED ORDER — OB COMPLETE PETITE 35-5-1-200 MG PO CAPS: 1.0000 | ORAL_CAPSULE | Freq: Every day | ORAL | 12 refills | Status: DC

## 2017-02-09 NOTE — Progress Notes (Signed)
Pt states she had some N&V in beginning of pregnancy, pt states now much better. Pt states she does not have appetite, has to force herself to eat. Pt sensitive to smells when cooking.

## 2017-02-09 NOTE — Progress Notes (Signed)
Subjective:    Sonya Frazier is being seen today for her first obstetrical visit.  This is a planned pregnancy. She is at [redacted]w[redacted]d gestation. Her obstetrical history is significant for none. Relationship with FOB: significant other, living together. Patient does intend to breast feed. Pregnancy history fully reviewed.  The information documented in the HPI was reviewed and verified.  Menstrual History: OB History    Gravida Para Term Preterm AB Living   2 0 0 0 1 0   SAB TAB Ectopic Multiple Live Births   1 0 0 0       Patient's last menstrual period was 11/27/2016.    Past Medical History:  Diagnosis Date  . Medical history non-contributory     Past Surgical History:  Procedure Laterality Date  . WISDOM TOOTH EXTRACTION       (Not in a hospital admission) No Known Allergies  Social History  Substance Use Topics  . Smoking status: Former Games developer  . Smokeless tobacco: Never Used  . Alcohol use No     Comment: occ    Family History  Problem Relation Age of Onset  . Asthma Mother   . Cancer Maternal Aunt      Review of Systems Constitutional: negative for weight loss Gastrointestinal: negative for vomiting Genitourinary:negative for genital lesions and vaginal discharge and dysuria Musculoskeletal:negative for back pain Behavioral/Psych: negative for abusive relationship, depression, illegal drug usage and tobacco use    Objective:    BP 133/64   Pulse 92   Wt 144 lb (65.3 kg)   LMP 11/27/2016   BMI 27.21 kg/m  General Appearance:    Alert, cooperative, no distress, appears stated age  Head:    Normocephalic, without obvious abnormality, atraumatic  Eyes:    PERRL, conjunctiva/corneas clear, EOM's intact, fundi    benign, both eyes  Ears:    Normal TM's and external ear canals, both ears  Nose:   Nares normal, septum midline, mucosa normal, no drainage    or sinus tenderness  Throat:   Lips, mucosa, and tongue normal; teeth and gums normal  Neck:   Supple,  symmetrical, trachea midline, no adenopathy;    thyroid:  no enlargement/tenderness/nodules; no carotid   bruit or JVD  Back:     Symmetric, no curvature, ROM normal, no CVA tenderness  Lungs:     Clear to auscultation bilaterally, respirations unlabored  Chest Wall:    No tenderness or deformity   Heart:    Regular rate and rhythm, S1 and S2 normal, no murmur, rub   or gallop  Breast Exam:    No tenderness, masses, or nipple abnormality  Abdomen:     Soft, non-tender, bowel sounds active all four quadrants,    no masses, no organomegaly  Genitalia:    Normal female without lesion, discharge or tenderness  Extremities:   Extremities normal, atraumatic, no cyanosis or edema  Pulses:   2+ and symmetric all extremities  Skin:   Skin color, texture, turgor normal, no rashes or lesions  Lymph nodes:   Cervical, supraclavicular, and axillary nodes normal  Neurologic:   CNII-XII intact, normal strength, sensation and reflexes    throughout          Cervix:  Long, thick, closed and posterior.  FRH: 150 by doppler.  FH: size less   than U & symphysis pubis: c/w dates.       Lab Review Urine pregnancy test Labs reviewed yes Radiologic studies reviewed yes  Assessment &  Plan    Pregnancy at 3544w4d weeks    1. Supervision of other normal pregnancy, antepartum    - Cervicovaginal ancillary only - Cytology - PAP - Hemoglobinopathy evaluation - Varicella zoster antibody, IgG - VITAMIN D 25 Hydroxy (Vit-D Deficiency, Fractures) - Culture, OB Urine - Obstetric Panel, Including HIV - Hemoglobin A1c - Inheritest Society Guided - Prenat-FeCbn-FeAspGl-FA-Omega (OB COMPLETE PETITE) 35-5-1-200 MG CAPS; Take 1 tablet by mouth daily.  Dispense: 30 capsule; Refill: 12  2. Nausea and vomiting during pregnancy prior to [redacted] weeks gestation    - Doxylamine-Pyridoxine (DICLEGIS) 10-10 MG TBEC; Take 1 tablet with breakfast and lunch.  Take 2 tablets at bedtime.  Dispense: 100 tablet; Refill: 4      Prenatal vitamins.  Counseling provided regarding continued use of seat belts, cessation of alcohol consumption, smoking or use of illicit drugs; infection precautions i.e., influenza/TDAP immunizations, toxoplasmosis,CMV, parvovirus, listeria and varicella; workplace safety, exercise during pregnancy; routine dental care, safe medications, sexual activity, hot tubs, saunas, pools, travel, caffeine use, fish and methlymercury, potential toxins, hair treatments, varicose veins Weight gain recommendations per IOM guidelines reviewed: underweight/BMI< 18.5--> gain 28 - 40 lbs; normal weight/BMI 18.5 - 24.9--> gain 25 - 35 lbs; overweight/BMI 25 - 29.9--> gain 15 - 25 lbs; obese/BMI >30->gain  11 - 20 lbs Problem list reviewed and updated. FIRST/CF mutation testing/NIPT/QUAD SCREEN/fragile X/Ashkenazi Jewish population testing/Spinal muscular atrophy discussed: requested. Role of ultrasound in pregnancy discussed; fetal survey: requested. Amniocentesis discussed: not indicated.  Meds ordered this encounter  Medications  . Prenat-FeCbn-FeAspGl-FA-Omega (OB COMPLETE PETITE) 35-5-1-200 MG CAPS    Sig: Take 1 tablet by mouth daily.    Dispense:  30 capsule    Refill:  12  . Doxylamine-Pyridoxine (DICLEGIS) 10-10 MG TBEC    Sig: Take 1 tablet with breakfast and lunch.  Take 2 tablets at bedtime.    Dispense:  100 tablet    Refill:  4   Orders Placed This Encounter  Procedures  . Culture, OB Urine  . Hemoglobinopathy evaluation  . Varicella zoster antibody, IgG  . VITAMIN D 25 Hydroxy (Vit-D Deficiency, Fractures)  . Obstetric Panel, Including HIV  . Hemoglobin A1c  . Inheritest Society Guided    Follow up in 4 weeks. 50% of 30 min visit spent on counseling and coordination of care.

## 2017-02-11 ENCOUNTER — Telehealth: Payer: Self-pay

## 2017-02-11 ENCOUNTER — Other Ambulatory Visit: Payer: Self-pay | Admitting: Certified Nurse Midwife

## 2017-02-11 DIAGNOSIS — R7989 Other specified abnormal findings of blood chemistry: Secondary | ICD-10-CM | POA: Insufficient documentation

## 2017-02-11 LAB — OBSTETRIC PANEL, INCLUDING HIV
ANTIBODY SCREEN: NEGATIVE
BASOS: 0 %
Basophils Absolute: 0 10*3/uL (ref 0.0–0.2)
EOS (ABSOLUTE): 0 10*3/uL (ref 0.0–0.4)
EOS: 0 %
HEMATOCRIT: 35.4 % (ref 34.0–46.6)
HEMOGLOBIN: 11.7 g/dL (ref 11.1–15.9)
HIV Screen 4th Generation wRfx: NONREACTIVE
Hepatitis B Surface Ag: NEGATIVE
Immature Grans (Abs): 0 10*3/uL (ref 0.0–0.1)
Immature Granulocytes: 0 %
LYMPHS ABS: 2.2 10*3/uL (ref 0.7–3.1)
Lymphs: 23 %
MCH: 26.9 pg (ref 26.6–33.0)
MCHC: 33.1 g/dL (ref 31.5–35.7)
MCV: 81 fL (ref 79–97)
MONOS ABS: 0.7 10*3/uL (ref 0.1–0.9)
Monocytes: 8 %
NEUTROS ABS: 6.7 10*3/uL (ref 1.4–7.0)
Neutrophils: 69 %
Platelets: 341 10*3/uL (ref 150–379)
RBC: 4.35 x10E6/uL (ref 3.77–5.28)
RDW: 15.8 % — ABNORMAL HIGH (ref 12.3–15.4)
RH TYPE: POSITIVE
RPR Ser Ql: NONREACTIVE
Rubella Antibodies, IGG: 2.29 index (ref >0.99)
WBC: 9.7 10*3/uL (ref 3.4–10.8)

## 2017-02-11 LAB — HEMOGLOBIN A1C
Est. average glucose Bld gHb Est-mCnc: 100 mg/dL
Hgb A1c MFr Bld: 5.1 % (ref 4.8–5.6)

## 2017-02-11 LAB — HEMOGLOBINOPATHY EVALUATION
HEMOGLOBIN A2 QUANTITATION: 2.3 % (ref 1.8–3.2)
HEMOGLOBIN F QUANTITATION: 0.8 % (ref 0.0–2.0)
HGB A: 96.9 % (ref 96.4–98.8)
HGB C: 0 %
HGB S: 0 %
HGB VARIANT: 0 %

## 2017-02-11 LAB — CULTURE, OB URINE

## 2017-02-11 LAB — URINE CULTURE, OB REFLEX

## 2017-02-11 LAB — VITAMIN D 25 HYDROXY (VIT D DEFICIENCY, FRACTURES): Vit D, 25-Hydroxy: 16.5 ng/mL — ABNORMAL LOW (ref 30.0–100.0)

## 2017-02-11 LAB — VARICELLA ZOSTER ANTIBODY, IGG: Varicella zoster IgG: 168 index (ref >165)

## 2017-02-11 MED ORDER — VITAMIN D (ERGOCALCIFEROL) 1.25 MG (50000 UNIT) PO CAPS: 50000.0000 [IU] | ORAL_CAPSULE | ORAL | 2 refills | Status: DC

## 2017-02-11 NOTE — Telephone Encounter (Signed)
Left message on VM to call office.

## 2017-02-11 NOTE — Telephone Encounter (Signed)
-----   Message from Roe Coombsachelle A Denney, CNM sent at 02/11/2017  9:14 AM EDT ----- Please let her know that her vitamin D level is low.  Vitamin D weekly tablet has been sent to her pharmacy for her to take.  Thank you.  R.Denney CNM

## 2017-02-15 LAB — CERVICOVAGINAL ANCILLARY ONLY
BACTERIAL VAGINITIS: POSITIVE — AB
Candida vaginitis: POSITIVE — AB
Chlamydia: NEGATIVE
Neisseria Gonorrhea: NEGATIVE
Trichomonas: NEGATIVE

## 2017-02-16 LAB — CYTOLOGY - PAP
DIAGNOSIS: UNDETERMINED — AB
HPV 16/18/45 genotyping: NEGATIVE
HPV: DETECTED — AB

## 2017-02-17 ENCOUNTER — Other Ambulatory Visit: Payer: Self-pay | Admitting: Certified Nurse Midwife

## 2017-02-17 DIAGNOSIS — Z3480 Encounter for supervision of other normal pregnancy, unspecified trimester: Secondary | ICD-10-CM

## 2017-02-17 DIAGNOSIS — B373 Candidiasis of vulva and vagina: Secondary | ICD-10-CM

## 2017-02-17 DIAGNOSIS — B3731 Acute candidiasis of vulva and vagina: Secondary | ICD-10-CM

## 2017-02-17 DIAGNOSIS — N760 Acute vaginitis: Secondary | ICD-10-CM

## 2017-02-17 DIAGNOSIS — B9689 Other specified bacterial agents as the cause of diseases classified elsewhere: Secondary | ICD-10-CM

## 2017-02-17 MED ORDER — TERCONAZOLE 0.8 % VA CREA: 1.0000 | TOPICAL_CREAM | Freq: Every day | VAGINAL | 0 refills | Status: DC

## 2017-02-17 MED ORDER — METRONIDAZOLE 0.75 % VA GEL: 1.0000 | Freq: Two times a day (BID) | VAGINAL | 0 refills | Status: DC

## 2017-02-24 ENCOUNTER — Telehealth: Payer: Self-pay

## 2017-02-24 NOTE — Telephone Encounter (Signed)
Advised pt of results and rx sent. 

## 2017-03-01 LAB — INHERITEST SOCIETY GUIDED

## 2017-03-02 ENCOUNTER — Other Ambulatory Visit: Payer: Self-pay | Admitting: Certified Nurse Midwife

## 2017-03-02 DIAGNOSIS — Z3480 Encounter for supervision of other normal pregnancy, unspecified trimester: Secondary | ICD-10-CM

## 2017-03-09 ENCOUNTER — Ambulatory Visit (INDEPENDENT_AMBULATORY_CARE_PROVIDER_SITE_OTHER): Payer: Medicaid Other | Admitting: Certified Nurse Midwife

## 2017-03-09 ENCOUNTER — Encounter: Payer: Self-pay | Admitting: Certified Nurse Midwife

## 2017-03-09 ENCOUNTER — Ambulatory Visit (HOSPITAL_COMMUNITY): Payer: Medicaid Other

## 2017-03-09 VITALS — BP 114/74 | HR 88 | Wt 146.0 lb

## 2017-03-09 DIAGNOSIS — Z3480 Encounter for supervision of other normal pregnancy, unspecified trimester: Secondary | ICD-10-CM

## 2017-03-09 DIAGNOSIS — Z3482 Encounter for supervision of other normal pregnancy, second trimester: Secondary | ICD-10-CM

## 2017-03-09 DIAGNOSIS — R7989 Other specified abnormal findings of blood chemistry: Secondary | ICD-10-CM

## 2017-03-09 NOTE — Progress Notes (Signed)
   PRENATAL VISIT NOTE  Subjective:  Deniece ReeJennell K Kendall is a 21 y.o. G2P0010 at 460w4d being seen today for ongoing prenatal care.  She is currently monitored for the following issues for this low-risk pregnancy and has Supervision of other normal pregnancy, antepartum and Low vitamin D level on her problem list.  Patient reports no complaints.  Contractions: Not present. Vag. Bleeding: None.   . Denies leaking of fluid.   The following portions of the patient's history were reviewed and updated as appropriate: allergies, current medications, past family history, past medical history, past social history, past surgical history and problem list. Problem list updated.  Objective:   Vitals:   03/09/17 0816  BP: 114/74  Pulse: 88  Weight: 146 lb (66.2 kg)    Fetal Status: Fetal Heart Rate (bpm): 148; doppler         General:  Alert, oriented and cooperative. Patient is in no acute distress.  Skin: Skin is warm and dry. No rash noted.   Cardiovascular: Normal heart rate noted  Respiratory: Normal respiratory effort, no problems with respiration noted  Abdomen: Soft, gravid, appropriate for gestational age.  Pain/Pressure: Absent     Pelvic: Cervical exam deferred        Extremities: Normal range of motion.  Edema: None  Mental Status:  Normal mood and affect. Normal behavior. Normal judgment and thought content.   Assessment and Plan:  Pregnancy: G2P0010 at 960w4d  1. Supervision of other normal pregnancy, antepartum      Doing well - US MFM OB COMP + 14 WK; Future - MaterniT21 PLUS Core+SCA  2. Low vitamin D level     Taking weekly vitamin D.   Preterm labor symptoms and general obstetric precautions including but not limited to vaginal bleeding, contractions, leaking of fluid and fetal movement were reviewed in detail with the patient. Please refer to After Visit Summary for other counseling recommendations.  Return in about 4 weeks (around 04/06/2017) for ROB.   Roe Coombsachelle A  Bonni Neuser, CNM

## 2017-03-09 NOTE — Progress Notes (Signed)
Patient is doing well

## 2017-03-11 ENCOUNTER — Encounter (HOSPITAL_COMMUNITY): Payer: Self-pay | Admitting: Emergency Medicine

## 2017-03-11 ENCOUNTER — Emergency Department (HOSPITAL_COMMUNITY)
Admission: EM | Admit: 2017-03-11 | Discharge: 2017-03-12 | Disposition: A | Discharge status: Home / Self Care | Payer: Medicaid Other | Attending: Emergency Medicine | Admitting: Emergency Medicine

## 2017-03-11 DIAGNOSIS — Z5321 Procedure and treatment not carried out due to patient leaving prior to being seen by health care provider: Secondary | ICD-10-CM | POA: Diagnosis not present

## 2017-03-11 DIAGNOSIS — R202 Paresthesia of skin: Secondary | ICD-10-CM | POA: Diagnosis not present

## 2017-03-11 DIAGNOSIS — F410 Panic disorder [episodic paroxysmal anxiety] without agoraphobia: Secondary | ICD-10-CM | POA: Diagnosis present

## 2017-03-11 NOTE — ED Triage Notes (Signed)
Pt from home via EMS following having a panic attack. Pt states she was getting home after eating when she felt tachycardic, tachypnic, and felt tingling in her extremities. Pt states symptoms have subsided. Pt denies pain. Pt states she is 4 months pregnant

## 2017-03-12 NOTE — ED Triage Notes (Signed)
Called  No response from lobby 

## 2017-03-14 LAB — MATERNIT21 PLUS CORE+SCA
Chromosome 13: NEGATIVE
Chromosome 18: NEGATIVE
Chromosome 21: NEGATIVE
Y Chromosome: DETECTED

## 2017-03-16 ENCOUNTER — Other Ambulatory Visit: Payer: Self-pay | Admitting: Certified Nurse Midwife

## 2017-03-16 DIAGNOSIS — Z3480 Encounter for supervision of other normal pregnancy, unspecified trimester: Secondary | ICD-10-CM

## 2017-04-09 ENCOUNTER — Other Ambulatory Visit: Payer: Self-pay | Admitting: Certified Nurse Midwife

## 2017-04-09 ENCOUNTER — Encounter: Payer: Self-pay | Admitting: Certified Nurse Midwife

## 2017-04-09 ENCOUNTER — Ambulatory Visit (HOSPITAL_COMMUNITY)
Admission: RE | Admit: 2017-04-09 | Discharge: 2017-04-09 | Disposition: A | Discharge status: Home / Self Care | Payer: Medicaid Other | Source: Ambulatory Visit | Attending: Certified Nurse Midwife | Admitting: Certified Nurse Midwife

## 2017-04-09 ENCOUNTER — Ambulatory Visit (INDEPENDENT_AMBULATORY_CARE_PROVIDER_SITE_OTHER): Payer: Medicaid Other | Admitting: Certified Nurse Midwife

## 2017-04-09 VITALS — BP 109/72 | HR 80 | Wt 142.0 lb

## 2017-04-09 DIAGNOSIS — Z3A19 19 weeks gestation of pregnancy: Secondary | ICD-10-CM

## 2017-04-09 DIAGNOSIS — Z3482 Encounter for supervision of other normal pregnancy, second trimester: Secondary | ICD-10-CM

## 2017-04-09 DIAGNOSIS — Z363 Encounter for antenatal screening for malformations: Secondary | ICD-10-CM | POA: Diagnosis not present

## 2017-04-09 DIAGNOSIS — O9989 Other specified diseases and conditions complicating pregnancy, childbirth and the puerperium: Secondary | ICD-10-CM | POA: Diagnosis not present

## 2017-04-09 DIAGNOSIS — R7989 Other specified abnormal findings of blood chemistry: Secondary | ICD-10-CM

## 2017-04-09 DIAGNOSIS — Z3480 Encounter for supervision of other normal pregnancy, unspecified trimester: Secondary | ICD-10-CM

## 2017-04-09 DIAGNOSIS — N1330 Unspecified hydronephrosis: Secondary | ICD-10-CM | POA: Diagnosis not present

## 2017-04-09 NOTE — Progress Notes (Signed)
   PRENATAL VISIT NOTE  Subjective:  Sonya Frazier is a 21 y.o. G2P0010 at 4821w0d being seen today for ongoing prenatal care.  She is currently monitored for the following issues for this low-risk pregnancy and has Supervision of other normal pregnancy, antepartum and Low vitamin D level on her problem list.  Patient reports fatigue, no bleeding, no contractions, no cramping and no leaking.  Contractions: Not present. Vag. Bleeding: None.  Movement: Absent. Denies leaking of fluid.   The following portions of the patient's history were reviewed and updated as appropriate: allergies, current medications, past family history, past medical history, past social history, past surgical history and problem list. Problem list updated.  Objective:   Vitals:   04/09/17 0910  BP: 109/72  Pulse: 80  Weight: 142 lb (64.4 kg)    Fetal Status: Fetal Heart Rate (bpm): 145; doppler Fundal Height: 18 cm Movement: Absent     General:  Alert, oriented and cooperative. Patient is in no acute distress.  Skin: Skin is warm and dry. No rash noted.   Cardiovascular: Normal heart rate noted  Respiratory: Normal respiratory effort, no problems with respiration noted  Abdomen: Soft, gravid, appropriate for gestational age.  Pain/Pressure: Absent     Pelvic: Cervical exam deferred        Extremities: Normal range of motion.  Edema: None  Mental Status:  Normal mood and affect. Normal behavior. Normal judgment and thought content.   Assessment and Plan:  Pregnancy: G2P0010 at 7321w0d  1. Supervision of other normal pregnancy, antepartum     Doing well.  Has US scheduled for today.  - AFP, Serum, Open Spina Bifida  2. Low vitamin D level     Taking vitamin D weekly.   Preterm labor symptoms and general obstetric precautions including but not limited to vaginal bleeding, contractions, leaking of fluid and fetal movement were reviewed in detail with the patient. Please refer to After Visit Summary for  other counseling recommendations.  Return in about 8 weeks (around 06/04/2017) for ROB, 2 hr OGTT.   Roe Coombsachelle A Denney, CNM

## 2017-04-09 NOTE — Progress Notes (Signed)
Patient declined the FLU shot 04/09/17..Marland Kitchen

## 2017-04-17 ENCOUNTER — Other Ambulatory Visit: Payer: Self-pay | Admitting: Certified Nurse Midwife

## 2017-04-17 DIAGNOSIS — Z3480 Encounter for supervision of other normal pregnancy, unspecified trimester: Secondary | ICD-10-CM

## 2017-04-20 ENCOUNTER — Encounter: Payer: Medicaid Other | Admitting: Certified Nurse Midwife

## 2017-04-20 ENCOUNTER — Encounter: Payer: Self-pay | Admitting: Certified Nurse Midwife

## 2017-04-23 LAB — AFP, SERUM, OPEN SPINA BIFIDA
AFP MOM: 1.52
AFP VALUE AFPOSL: 84.7 ng/mL
Gest. Age on Collection Date: 19 weeks
MATERNAL AGE AT EDD: 22.3 a
OSBR Risk 1 IN: 5182
TEST RESULTS AFP: NEGATIVE
Weight: 142 [lb_av]

## 2017-04-25 ENCOUNTER — Encounter (HOSPITAL_COMMUNITY): Payer: Self-pay

## 2017-04-25 ENCOUNTER — Inpatient Hospital Stay (HOSPITAL_COMMUNITY)
Admission: AD | Admit: 2017-04-25 | Discharge: 2017-04-25 | Disposition: A | Discharge status: Home / Self Care | Payer: Medicaid Other | Source: Ambulatory Visit | Attending: Obstetrics and Gynecology | Admitting: Obstetrics and Gynecology

## 2017-04-25 DIAGNOSIS — B373 Candidiasis of vulva and vagina: Secondary | ICD-10-CM | POA: Diagnosis not present

## 2017-04-25 DIAGNOSIS — Z3480 Encounter for supervision of other normal pregnancy, unspecified trimester: Secondary | ICD-10-CM

## 2017-04-25 DIAGNOSIS — O98812 Other maternal infectious and parasitic diseases complicating pregnancy, second trimester: Secondary | ICD-10-CM | POA: Insufficient documentation

## 2017-04-25 DIAGNOSIS — O210 Mild hyperemesis gravidarum: Secondary | ICD-10-CM | POA: Diagnosis present

## 2017-04-25 DIAGNOSIS — O219 Vomiting of pregnancy, unspecified: Secondary | ICD-10-CM

## 2017-04-25 DIAGNOSIS — Z87891 Personal history of nicotine dependence: Secondary | ICD-10-CM | POA: Insufficient documentation

## 2017-04-25 DIAGNOSIS — Z3A21 21 weeks gestation of pregnancy: Secondary | ICD-10-CM | POA: Insufficient documentation

## 2017-04-25 DIAGNOSIS — B3731 Acute candidiasis of vulva and vagina: Secondary | ICD-10-CM

## 2017-04-25 LAB — WET PREP, GENITAL
Clue Cells Wet Prep HPF POC: NONE SEEN
Sperm: NONE SEEN
TRICH WET PREP: NONE SEEN

## 2017-04-25 MED ORDER — ONDANSETRON 8 MG PO TBDP: 8.0000 mg | ORAL_TABLET | Freq: Two times a day (BID) | ORAL | 0 refills | Status: DC

## 2017-04-25 MED ORDER — TERCONAZOLE 0.4 % VA CREA: 1.0000 | TOPICAL_CREAM | Freq: Every day | VAGINAL | 0 refills | Status: DC

## 2017-04-25 NOTE — MAU Note (Signed)
Pt is a G2P1 at 21.2 weeks, c/o vomiting of yellow mucus, and a small blood clot this am when she went to the bathroom.  Positive FM, no other OB complications.

## 2017-04-25 NOTE — Discharge Instructions (Signed)
Get your medicines from your pharmacy tomorrow. You have a pill that can go under your tongue for nausea. Eat small frequent meals 5-6 times a day. Drink at least 8 8-oz glasses of water every day. Take Tylenol 325 mg 2 tablets by mouth every 4 hours if needed for pain. Use the vaginal cream for a yeast infection once a day at bedtime for 7 days.

## 2017-04-25 NOTE — MAU Note (Signed)
Urine sent to lab 

## 2017-04-25 NOTE — MAU Provider Note (Signed)
History     CSN: [id number]  Arrival date and time: 04/25/17 1740   Seen by provider at 1710.    Chief Complaint  Patient presents with  . Vaginal Bleeding  . Emesis During Pregnancy   HPI Sonya Frazier 21 y.o. [redacted]w[redacted]d Has had vomiting every morning and every night.  Does not eat much during the day.  Was worried as her vomiting was yellow.  No intercourse in the past 2 weeks.  She has had two very small blood clots when she wiped - one last night and one this morning.  No cramping,  No vaginal discharge.  Is a patient of [facility] [place].   OB History    Gravida Para Term Preterm AB Living   2 0 0 0 1 0   SAB TAB Ectopic Multiple Live Births   1 0 0 0        Past Medical History:  Diagnosis Date  . Medical history non-contributory     Past Surgical History:  Procedure Laterality Date  . WISDOM TOOTH EXTRACTION      Family History  Problem Relation Age of Onset  . Asthma Mother   . Cancer Maternal Aunt     Social History  Substance Use Topics  . Smoking status: Former Games developer  . Smokeless tobacco: Never Used  . Alcohol use No     Comment: occ    Allergies: No Known Allergies  Prescriptions Prior to Admission  Medication Sig Dispense Refill Last Dose  . Doxylamine-Pyridoxine (DICLEGIS) 10-10 MG TBEC Take 1 tablet with breakfast and lunch.  Take 2 tablets at bedtime. (Patient not taking: Reported on 03/09/2017) 100 tablet 4 Not Taking  . metroNIDAZOLE (METROGEL VAGINAL) 0.75 % vaginal gel Place 1 Applicatorful vaginally 2 (two) times daily. 70 g 0 Taking  . Prenat-FeCbn-FeAspGl-FA-Omega (OB COMPLETE PETITE) 35-5-1-200 MG CAPS Take 1 tablet by mouth daily. 30 capsule 12 Taking  . terconazole (TERAZOL 3) 0.8 % vaginal cream Place 1 applicator vaginally at bedtime. 20 g 0 Taking  . Vitamin D, Ergocalciferol, (DRISDOL) 50000 units CAPS capsule Take 1 capsule (50,000 Units total) by mouth every 7 (seven) days. 30 capsule 2 Taking    Review of Systems  Constitutional:  Negative for fever.  Gastrointestinal: Positive for nausea and vomiting.  Genitourinary: Positive for vaginal bleeding. Negative for vaginal discharge.   Physical Exam   Temperature (!) 97.3 F (36.3 C), last menstrual period 11/27/2016, unknown if currently breastfeeding.  Physical Exam  Nursing note and vitals reviewed. Constitutional: She is oriented to person, place, and time. She appears well-developed and well-nourished.  HENT:  Head: Normocephalic.  Eyes: EOM are normal.  Neck: Neck supple.  GI: Soft. There is no tenderness. There is no rebound and no guarding.  Genitourinary:  Genitourinary Comments: Speculum exam:Vulva - bright red on mucous membranes with white grainy adherent discharge Vagina - mod amount of white, creamy discharge, no odor Cervix - No contact bleeding Bimanual exam: Cervix closed, long and thick - no blood seen wet prep done Chaperone present for exam.   Musculoskeletal: Normal range of motion.  Neurological: She is alert and oriented to person, place, and time.  Skin: Skin is warm and dry.  Psychiatric: She has a normal mood and affect.    MAU Course  Procedures  MDM Discussed nutrition with client - she has no appetite and goes long hours eating chips.  Then she has vomiting of yellow bile.  Encouraged her to eat more frequently.  She asked about drinking Ensure - that is OK but food is even better.  Assessment and Plan  Nausea and vomiting Yeast infection  Plan Get your medicines from your pharmacy tomorrow - Zofran for nausea and Terazol for the yeast infection. You have a pill that can go under your tongue for nausea. Eat small frequent meals 5-6 times a day. Drink at least 8 8-oz glasses of water every day. Take Tylenol 325 mg 2 tablets by mouth every 4 hours if needed for pain. Use the vaginal cream for a yeast infection once a day at bedtime for 7 days.   Currie Paris 04/25/2017, 6:55 PM

## 2017-04-26 ENCOUNTER — Other Ambulatory Visit: Payer: Self-pay | Admitting: Certified Nurse Midwife

## 2017-04-26 DIAGNOSIS — Z3480 Encounter for supervision of other normal pregnancy, unspecified trimester: Secondary | ICD-10-CM

## 2017-05-17 ENCOUNTER — Ambulatory Visit (HOSPITAL_COMMUNITY)
Admission: RE | Admit: 2017-05-17 | Discharge: 2017-05-17 | Disposition: A | Discharge status: Home / Self Care | Payer: Medicaid Other | Source: Ambulatory Visit | Attending: Certified Nurse Midwife | Admitting: Certified Nurse Midwife

## 2017-05-17 DIAGNOSIS — Z3482 Encounter for supervision of other normal pregnancy, second trimester: Secondary | ICD-10-CM | POA: Diagnosis not present

## 2017-05-17 DIAGNOSIS — Z3480 Encounter for supervision of other normal pregnancy, unspecified trimester: Secondary | ICD-10-CM

## 2017-05-17 DIAGNOSIS — Z3A24 24 weeks gestation of pregnancy: Secondary | ICD-10-CM | POA: Insufficient documentation

## 2017-05-18 ENCOUNTER — Other Ambulatory Visit: Payer: Self-pay | Admitting: Certified Nurse Midwife

## 2017-05-18 DIAGNOSIS — Z3480 Encounter for supervision of other normal pregnancy, unspecified trimester: Secondary | ICD-10-CM

## 2017-05-18 DIAGNOSIS — O35EXX0 Maternal care for other (suspected) fetal abnormality and damage, fetal genitourinary anomalies, not applicable or unspecified: Secondary | ICD-10-CM | POA: Insufficient documentation

## 2017-05-18 DIAGNOSIS — O358XX0 Maternal care for other (suspected) fetal abnormality and damage, not applicable or unspecified: Secondary | ICD-10-CM

## 2017-06-02 ENCOUNTER — Ambulatory Visit (INDEPENDENT_AMBULATORY_CARE_PROVIDER_SITE_OTHER): Payer: Medicaid Other | Admitting: Certified Nurse Midwife

## 2017-06-02 ENCOUNTER — Other Ambulatory Visit: Payer: Medicaid Other

## 2017-06-02 VITALS — BP 117/69 | HR 94 | Wt 148.0 lb

## 2017-06-02 DIAGNOSIS — Z3480 Encounter for supervision of other normal pregnancy, unspecified trimester: Secondary | ICD-10-CM

## 2017-06-02 DIAGNOSIS — R7989 Other specified abnormal findings of blood chemistry: Secondary | ICD-10-CM

## 2017-06-02 NOTE — Progress Notes (Signed)
   PRENATAL VISIT NOTE  Subjective:  Sonya Frazier is a 21 y.o. G2P0010 at 2989w5d being seen today for ongoing prenatal care.  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  Patient reports no complaints.  Contractions: Not present. Vag. Bleeding: None.  Movement: Present. Denies leaking of fluid.   The following portions of the patient's history were reviewed and updated as appropriate: allergies, current medications, past family history, past medical history, past social history, past surgical history and problem list. Problem list updated.  Objective:   Vitals:   06/02/17 0842  BP: 117/69  Pulse: 94  Weight: 148 lb (67.1 kg)    Fetal Status: Fetal Heart Rate (bpm): 148; doppler Fundal Height: 26 cm Movement: Present     General:  Alert, oriented and cooperative. Patient is in no acute distress.  Skin: Skin is warm and dry. No rash noted.   Cardiovascular: Normal heart rate noted  Respiratory: Normal respiratory effort, no problems with respiration noted  Abdomen: Soft, gravid, appropriate for gestational age.  Pain/Pressure: Absent     Pelvic: Cervical exam deferred        Extremities: Normal range of motion.  Edema: None  Mental Status:  Normal mood and affect. Normal behavior. Normal judgment and thought content.   Assessment and Plan:  Pregnancy: G2P0010 at 7289w5d  1. Supervision of other normal pregnancy, antepartum      Doing well.   - Glucose Tolerance, 2 Hours w/1 Hour - CBC - HIV antibody (with reflex) - RPR  2. Low vitamin D level      Taking weekly vitamin D  Preterm labor symptoms and general obstetric precautions including but not limited to vaginal bleeding, contractions, leaking of fluid and fetal movement were reviewed in detail with the patient. Please refer to  After Visit Summary for other counseling recommendations.  Return in about 2 weeks (around 06/16/2017) for ROB.   Roe Coombsachelle A Denney, CNM

## 2017-06-02 NOTE — Progress Notes (Signed)
ROB/GTT.  TDAP deferred until after delivery

## 2017-06-03 LAB — CBC
Hematocrit: 27.7 % — ABNORMAL LOW (ref 34.0–46.6)
Hemoglobin: 8.9 g/dL — ABNORMAL LOW (ref 11.1–15.9)
MCH: 28.4 pg (ref 26.6–33.0)
MCHC: 32.1 g/dL (ref 31.5–35.7)
MCV: 89 fL (ref 79–97)
PLATELETS: 290 10*3/uL (ref 150–379)
RBC: 3.13 x10E6/uL — ABNORMAL LOW (ref 3.77–5.28)
RDW: 13.3 % (ref 12.3–15.4)
WBC: 12.8 10*3/uL — AB (ref 3.4–10.8)

## 2017-06-03 LAB — GLUCOSE TOLERANCE, 2 HOURS W/ 1HR
GLUCOSE, 1 HOUR: 107 mg/dL (ref 65–179)
GLUCOSE, 2 HOUR: 94 mg/dL (ref 65–152)
GLUCOSE, FASTING: 78 mg/dL (ref 65–91)

## 2017-06-03 LAB — HIV ANTIBODY (ROUTINE TESTING W REFLEX): HIV SCREEN 4TH GENERATION: NONREACTIVE

## 2017-06-03 LAB — RPR: RPR: NONREACTIVE

## 2017-06-04 ENCOUNTER — Other Ambulatory Visit: Payer: Self-pay | Admitting: Certified Nurse Midwife

## 2017-06-04 DIAGNOSIS — D509 Iron deficiency anemia, unspecified: Secondary | ICD-10-CM | POA: Insufficient documentation

## 2017-06-04 DIAGNOSIS — Z3480 Encounter for supervision of other normal pregnancy, unspecified trimester: Secondary | ICD-10-CM

## 2017-06-04 DIAGNOSIS — O99013 Anemia complicating pregnancy, third trimester: Secondary | ICD-10-CM

## 2017-06-04 DIAGNOSIS — O99012 Anemia complicating pregnancy, second trimester: Secondary | ICD-10-CM

## 2017-06-04 MED ORDER — CITRANATAL BLOOM 90-1 MG PO TABS: 1.0000 | ORAL_TABLET | Freq: Every day | ORAL | 12 refills | Status: DC

## 2017-06-09 LAB — FERRITIN: FERRITIN: 10 ng/mL — AB (ref 15–150)

## 2017-06-09 LAB — IRON AND TIBC
IRON SATURATION: 10 % — AB (ref 15–55)
IRON: 40 ug/dL (ref 27–159)
TIBC: 398 ug/dL (ref 250–450)
UIBC: 358 ug/dL (ref 131–425)

## 2017-06-09 LAB — SPECIMEN STATUS REPORT

## 2017-06-10 ENCOUNTER — Other Ambulatory Visit: Payer: Self-pay | Admitting: Certified Nurse Midwife

## 2017-06-10 DIAGNOSIS — O99013 Anemia complicating pregnancy, third trimester: Principal | ICD-10-CM

## 2017-06-10 DIAGNOSIS — D509 Iron deficiency anemia, unspecified: Secondary | ICD-10-CM

## 2017-06-10 MED ORDER — IRON 325 (65 FE) MG PO TABS: 1.0000 | ORAL_TABLET | Freq: Three times a day (TID) | ORAL | 2 refills | Status: DC

## 2017-06-16 ENCOUNTER — Ambulatory Visit (INDEPENDENT_AMBULATORY_CARE_PROVIDER_SITE_OTHER): Payer: Medicaid Other | Admitting: Certified Nurse Midwife

## 2017-06-16 VITALS — BP 117/72 | HR 86 | Wt 147.0 lb

## 2017-06-16 DIAGNOSIS — Z3482 Encounter for supervision of other normal pregnancy, second trimester: Secondary | ICD-10-CM

## 2017-06-16 DIAGNOSIS — R7989 Other specified abnormal findings of blood chemistry: Secondary | ICD-10-CM

## 2017-06-16 DIAGNOSIS — Z3480 Encounter for supervision of other normal pregnancy, unspecified trimester: Secondary | ICD-10-CM

## 2017-06-16 NOTE — Progress Notes (Signed)
   PRENATAL VISIT NOTE  Subjective:  Sonya Frazier is a 21 y.o. G2P0010 at 4572w5d being seen today for ongoing prenatal care.  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  Patient reports no complaints.  Contractions: Not present. Vag. Bleeding: None.  Movement: Present. Denies leaking of fluid.   The following portions of the patient's history were reviewed and updated as appropriate: allergies, current medications, past family history, past medical history, past social history, past surgical history and problem list. Problem list updated.  Objective:   Vitals:   06/16/17 0939  BP: 117/72  Pulse: 86  Weight: 147 lb (66.7 kg)    Fetal Status:     Movement: Present     General:  Alert, oriented and cooperative. Patient is in no acute distress.  Skin: Skin is warm and dry. No rash noted.   Cardiovascular: Normal heart rate noted  Respiratory: Normal respiratory effort, no problems with respiration noted  Abdomen: Soft, gravid, appropriate for gestational age.  Pain/Pressure: Absent     Pelvic: Cervical exam deferred        Extremities: Normal range of motion.     Mental Status:  Normal mood and affect. Normal behavior. Normal judgment and thought content.   Assessment and Plan:  Pregnancy: G2P0010 at 10672w5d  1. Supervision of other normal pregnancy, antepartum     Doing well  2. Low vitamin D level     Taking weekly vitamin D.  Preterm labor symptoms and general obstetric precautions including but not limited to vaginal bleeding, contractions, leaking of fluid and fetal movement were reviewed in detail with the patient. Please refer to After Visit Summary for other counseling recommendations.  Return in  about 2 weeks (around 06/30/2017) for ROB.   Roe Coombsachelle A Patterson Hollenbaugh, CNM

## 2017-06-30 ENCOUNTER — Encounter: Payer: Self-pay | Admitting: Certified Nurse Midwife

## 2017-06-30 ENCOUNTER — Ambulatory Visit (INDEPENDENT_AMBULATORY_CARE_PROVIDER_SITE_OTHER): Payer: Medicaid Other | Admitting: Certified Nurse Midwife

## 2017-06-30 VITALS — BP 120/75 | HR 96 | Wt 149.4 lb

## 2017-06-30 DIAGNOSIS — Z3483 Encounter for supervision of other normal pregnancy, third trimester: Secondary | ICD-10-CM

## 2017-06-30 DIAGNOSIS — R7989 Other specified abnormal findings of blood chemistry: Secondary | ICD-10-CM

## 2017-06-30 DIAGNOSIS — Z3480 Encounter for supervision of other normal pregnancy, unspecified trimester: Secondary | ICD-10-CM

## 2017-06-30 DIAGNOSIS — D509 Iron deficiency anemia, unspecified: Secondary | ICD-10-CM

## 2017-06-30 DIAGNOSIS — O99013 Anemia complicating pregnancy, third trimester: Secondary | ICD-10-CM

## 2017-06-30 NOTE — Progress Notes (Signed)
Pt c/o iron tabs making nauseous and dizzy

## 2017-06-30 NOTE — Patient Instructions (Addendum)
Before [facility] Before your baby arrives it is important to:  Have all of the supplies that you will need to care for your baby.  Know where to go if there is an emergency.  Discuss the baby's arrival with other family members.  What supplies will I need?  It is recommended that you have the following supplies: Large Items  Crib.  Crib mattress.  Rear-facing infant car seat. If possible, have a trained professional check to make sure that it is installed correctly.  Feeding  6-8 bottles that are 4-5 oz in size.  6-8 nipples.  Bottle brush.  Sterilizer, or a large pan or kettle with a lid.  A way to boil and cool water.  If you will be breastfeeding: ? Breast pump. ? Nipple cream. ? Nursing bra. ? Breast pads. ? Breast shields.  If you will be formula feeding: ? Formula. ? Measuring cups. ? Measuring spoons.  Bathing  Mild baby soap and baby shampoo.  Petroleum jelly.  Soft cloth towel and washcloth.  Hooded towel.  Cotton balls.  Bath basin.  Other Supplies  Rectal thermometer.  Bulb syringe.  Baby wipes or washcloths for diaper changes.  Diaper bag.  Changing pad.  Clothing, including one-piece outfits and pajamas.  Baby nail clippers.  Receiving blankets.  Mattress pad and sheets for the crib.  Night-light for the baby's room.  Baby monitor.  2 or 3 pacifiers.  Either 24-36 cloth diapers and waterproof diaper covers or a box of disposable diapers. You may need to use as many as 10-12 diapers per day.  How do I prepare for an emergency? Prepare for an emergency by:  Knowing how to get to the nearest hospital.  Listing the phone numbers of your baby's health care providers near your home phone and in your cell phone.  How do I prepare my family?  Decide how to handle visitors.  If you have other children: ? Talk with them about the baby coming home. Ask them how they feel about it. ? Read a book together about  being a new big brother or sister. ? Find ways to let them help you prepare for the new baby. ? Have someone ready to care for them while you are in the hospital. This information is not intended to replace advice given to you by your health care provider. Make sure you discuss any questions you have with your health care provider. Document Released: 07/02/2008 Document Revised: 12/26/2015 Document Reviewed: 06/27/2014 Elsevier Interactive Patient Education  2018 [employer]. Anemia, Nonspecific Anemia is a condition in which the concentration of red blood cells or hemoglobin in the blood is below normal. Hemoglobin is a substance in red blood cells that carries oxygen to the tissues of the body. Anemia results in not enough oxygen reaching these tissues. What are the causes? Common causes of anemia include:  Excessive bleeding. Bleeding may be internal or external. This includes excessive bleeding from periods (in women) or from the intestine.  Poor nutrition.  Chronic kidney, thyroid, and liver disease.  Bone marrow disorders that decrease red blood cell production.  Cancer and treatments for cancer.  HIV, AIDS, and their treatments.  Spleen problems that increase red blood cell destruction.  Blood disorders.  Excess destruction of red blood cells due to infection, medicines, and autoimmune disorders.  What are the signs or symptoms?  Minor weakness.  Dizziness.  Headache.  Palpitations.  Shortness of breath, especially with exercise.  Paleness.  Cold sensitivity.  Indigestion.  Nausea.  Difficulty sleeping.  Difficulty concentrating. Symptoms may occur suddenly or they may develop slowly. How is this diagnosed? Additional blood tests are often needed. These help your health care provider determine the best treatment. Your health care provider will check your stool for blood and look for other causes of blood loss. How is this treated? Treatment varies  depending on the cause of the anemia. Treatment can include:  Supplements of iron, vitamin N36, or folic acid.  Hormone medicines.  A blood transfusion. This may be needed if blood loss is severe.  Hospitalization. This may be needed if there is significant continual blood loss.  Dietary changes.  Spleen removal.  Follow these instructions at home: Keep all follow-up appointments. It often takes many weeks to correct anemia, and having your health care provider check on your condition and your response to treatment is very important. Get help right away if:  You develop extreme weakness, shortness of breath, or chest pain.  You become dizzy or have trouble concentrating.  You develop heavy vaginal bleeding.  You develop a rash.  You have bloody or black, tarry stools.  You faint.  You vomit up blood.  You vomit repeatedly.  You have abdominal pain.  You have a fever or persistent symptoms for more than 2-3 days.  You have a fever and your symptoms suddenly get worse.  You are dehydrated. This information is not intended to replace advice given to you by your health care provider. Make sure you discuss any questions you have with your health care provider. Document Released: 08/27/2004 Document Revised: 01/01/2016 Document Reviewed: 01/13/2013 Elsevier Interactive Patient Education  2017 [place] of Pregnancy The third trimester is from week 29 through week 42, months 7 through 9. This trimester is when your unborn baby (fetus) is growing very fast. At the end of the ninth month, the unborn baby is about 20 inches in length. It weighs about 6-10 pounds. Follow these instructions at home:  Avoid all smoking, herbs, and alcohol. Avoid drugs not approved by your doctor.  Do not use any tobacco products, including cigarettes, chewing tobacco, and electronic cigarettes. If you need help quitting, ask your doctor. You may get counseling or other  support to help you quit.  Only take medicine as told by your doctor. Some medicines are safe and some are not during pregnancy.  Exercise only as told by your doctor. Stop exercising if you start having cramps.  Eat regular, healthy meals.  Wear a good support bra if your breasts are tender.  Do not use hot tubs, steam rooms, or saunas.  Wear your seat belt when driving.  Avoid raw meat, uncooked cheese, and liter boxes and soil used by cats.  Take your prenatal vitamins.  Take 1500-2000 milligrams of calcium daily starting at the 20th week of pregnancy until you deliver your baby.  Try taking medicine that helps you poop (stool softener) as needed, and if your doctor approves. Eat more fiber by eating fresh fruit, vegetables, and whole grains. Drink enough fluids to keep your pee (urine) clear or pale yellow.  Take warm water baths (sitz baths) to soothe pain or discomfort caused by hemorrhoids. Use hemorrhoid cream if your doctor approves.  If you have puffy, bulging veins (varicose veins), wear support hose. Raise (elevate) your feet for 15 minutes, 3-4 times a day. Limit salt in your diet.  Avoid heavy lifting, wear low heels, and sit up  straight.  Rest with your legs raised if you have leg cramps or low back pain.  Visit your dentist if you have not gone during your pregnancy. Use a soft toothbrush to brush your teeth. Be gentle when you floss.  You can have sex (intercourse) unless your doctor tells you not to.  Do not travel far distances unless you must. Only do so with your doctor's approval.  Take prenatal classes.  Practice driving to the hospital.  Pack your hospital bag.  Prepare the baby's room.  Go to your doctor visits. Get help if:  You are not sure if you are in labor or if your water has broken.  You are dizzy.  You have mild cramps or pressure in your lower belly (abdominal).  You have a nagging pain in your belly area.  You continue to feel  sick to your stomach (nauseous), throw up (vomit), or have watery poop (diarrhea).  You have bad smelling fluid coming from your vagina.  You have pain with peeing (urination). Get help right away if:  You have a fever.  You are leaking fluid from your vagina.  You are spotting or bleeding from your vagina.  You have severe belly cramping or pain.  You lose or gain weight rapidly.  You have trouble catching your breath and have chest pain.  You notice sudden or extreme puffiness (swelling) of your face, hands, ankles, feet, or legs.  You have not felt the baby move in over an hour.  You have severe headaches that do not go away with medicine.  You have vision changes. This information is not intended to replace advice given to you by your health care provider. Make sure you discuss any questions you have with your health care provider. Document Released: 10/14/2009 Document Revised: 12/26/2015 Document Reviewed: 09/20/2012 Elsevier Interactive Patient Education  2017 [employer]  Iron-Rich Diet Iron is a mineral that helps your body to produce hemoglobin. Hemoglobin is a protein in your red blood cells that carries oxygen to your body's tissues. Eating too little iron may cause you to feel weak and tired, and it can increase your risk for infection. Eating enough iron is necessary for your body's metabolism, muscle function, and nervous system. Iron is naturally found in many foods. It can also be added to foods or fortified in foods. There are two types of dietary iron:  Heme iron. Heme iron is absorbed by the body more easily than nonheme iron. Heme iron is found in meat, poultry, and fish.  Nonheme iron. Nonheme iron is found in dietary supplements, iron-fortified grains, beans, and vegetables.  You may need to follow an iron-rich diet if:  You have been diagnosed with iron deficiency or iron-deficiency anemia.  You have a condition that prevents you from absorbing  dietary iron, such as: ? Infection in your intestines. ? Celiac disease. This involves long-lasting (chronic) inflammation of your intestines.  You do not eat enough iron.  You eat a diet that is high in foods that impair iron absorption.  You have lost a lot of blood.  You have heavy bleeding during your menstrual cycle.  You are pregnant.  What is my plan? Your health care provider may help you to determine how much iron you need per day based on your condition. Generally, when a person consumes sufficient amounts of iron in the diet, the following iron needs are met:  Men. ? 56-46 years old: 11 mg per day. ? 57-44 years old:  8 mg per day.  Women. ? 35-78 years old: 15 mg per day. ? 82-79 years old: 18 mg per day. ? Over 21 years old: 8 mg per day. ? Pregnant women: 27 mg per day. ? Breastfeeding women: 9 mg per day.  What do I need to know about an iron-rich diet?  Eat fresh fruits and vegetables that are high in vitamin C along with foods that are high in iron. This will help increase the amount of iron that your body absorbs from food, especially with foods containing nonheme iron. Foods that are high in vitamin C include oranges, peppers, tomatoes, and mango.  Take iron supplements only as directed by your health care provider. Overdose of iron can be life-threatening. If you were prescribed iron supplements, take them with orange juice or a vitamin C supplement.  Cook foods in pots and pans that are made from iron.  Eat nonheme iron-containing foods alongside foods that are high in heme iron. This helps to improve your iron absorption.  Certain foods and drinks contain compounds that impair iron absorption. Avoid eating these foods in the same meal as iron-rich foods or with iron supplements. These include: ? Coffee, black tea, and red wine. ? Milk, dairy products, and foods that are high in calcium. ? Beans, soybeans, and peas. ? Whole grains.  When eating foods  that contain both nonheme iron and compounds that impair iron absorption, follow these tips to absorb iron better. ? Soak beans overnight before cooking. ? Soak whole grains overnight and drain them before using. ? Ferment flours before baking, such as using yeast in bread dough. What foods can I eat? Grains Iron-fortified breakfast cereal. Iron-fortified whole-wheat bread. Enriched rice. Sprouted grains. Vegetables Spinach. Potatoes with skin. Green peas. Broccoli. Red and green bell peppers. Fermented vegetables. Fruits Prunes. Raisins. Oranges. Strawberries. Mango. Grapefruit. Meats and Other Protein Sources Beef liver. Oysters. Beef. Shrimp. [place]. Chicken. [place]. Sardines. Chickpeas. Nuts. Tofu. Beverages Tomato juice. Fresh orange juice. Prune juice. Hibiscus tea. Fortified instant breakfast shakes. Condiments Tahini. Fermented soy sauce. Sweets and Desserts Black-strap molasses. Other Wheat germ. The items listed above may not be a complete list of recommended foods or beverages. Contact your dietitian for more options. What foods are not recommended? Grains Whole grains. Bran cereal. Bran flour. Oats. Vegetables Artichokes. Brussels sprouts. Kale. Fruits Blueberries. Raspberries. Strawberries. Figs. Meats and Other Protein Sources Soybeans. Products made from soy protein. Dairy Milk. Cream. Cheese. Yogurt. Cottage cheese. Beverages Coffee. Black tea. Red wine. Sweets and Desserts Cocoa. Chocolate. Ice cream. Other Basil. Oregano. Parsley. The items listed above may not be a complete list of foods and beverages to avoid. Contact your dietitian for more information. This information is not intended to replace advice given to you by your health care provider. Make sure you discuss any questions you have with your health care provider. Document Released: 03/03/2005 Document Revised: 02/07/2016 Document Reviewed: 02/14/2014 Elsevier Interactive Patient Education  2018  [employer]. Pregnancy and Anemia Anemia is a condition in which the concentration of red blood cells or hemoglobin in the blood is below normal. Hemoglobin is a substance in red blood cells that carries oxygen to the tissues of the body. Anemia results in not enough oxygen reaching these tissues. Anemia during pregnancy is common because the fetus uses more iron and folic acid as it is developing. Your body may not produce enough red blood cells because of this. Also, during pregnancy, the liquid part of the blood (plasma) increases  by about 50%, and the red blood cells increase by only 25%. This lowers the concentration of the red blood cells and creates a natural anemia-like situation. What are the causes? The most common cause of anemia during pregnancy is not having enough iron in the body to make red blood cells (iron deficiency anemia). Other causes may include:  Folic acid deficiency.  Vitamin B12 deficiency.  Certain prescription or over-the-counter medicines.  Certain medical conditions or infections that destroy red blood cells.  A low platelet count and bleeding caused by antibodies that go through the placenta to the fetus from the mother's blood.  What are the signs or symptoms? Mild anemia may not be noticeable. If it becomes severe, symptoms may include:  Tiredness.  Shortness of breath, especially with exercise.  Weakness.  Fainting.  Pale looking skin.  Headaches.  Feeling a fast or irregular heartbeat (palpitations).  How is this diagnosed? The type of anemia is usually diagnosed from your family and medical history and blood tests. How is this treated? Treatment of anemia during pregnancy depends on the cause of the anemia. Treatment can include:  Supplements of iron, vitamin H88, or folic acid.  A blood transfusion. This may be needed if blood loss is severe.  Hospitalization. This may be needed if there is significant continual blood loss.  Dietary  changes.  Follow these instructions at home:  Follow your dietitian's or health care provider's dietary recommendations.  Increase your vitamin C intake. This will help the stomach absorb more iron.  Eat a diet rich in iron. This would include foods such as: ? Liver. ? Beef. ? Whole grain bread. ? Eggs. ? Dried fruit.  Take iron and vitamins as directed by your health care provider.  Eat green leafy vegetables. These are a good source of folic acid. Contact a health care provider if:  You have frequent or lasting headaches.  You are looking pale.  You are bruising easily. Get help right away if:  You have extreme weakness, shortness of breath, or chest pain.  You become dizzy or have trouble concentrating.  You have heavy vaginal bleeding.  You develop a rash.  You have bloody or black, tarry stools.  You faint.  You vomit up blood.  You vomit repeatedly.  You have abdominal pain.  You have a fever or persistent symptoms for more than 2-3 days.  You have a fever and your symptoms suddenly get worse.  You are dehydrated. This information is not intended to replace advice given to you by your health care provider. Make sure you discuss any questions you have with your health care provider. Document Released: 07/17/2000 Document Revised: 12/26/2015 Document Reviewed: 03/01/2013 Elsevier Interactive Patient Education  2017 [place].  Vaginal Delivery Vaginal delivery means that you will give birth by pushing your baby out of your birth canal (vagina). A team of health care providers will help you before, during, and after vaginal delivery. Birth experiences are unique for every woman and every pregnancy, and birth experiences vary depending on where you choose to give birth. What should I do to prepare for my baby's birth? Before your baby is born, it is important to talk with your health care provider about:  Your labor and delivery preferences. These may  include: ? Medicines that you may be given. ? How you will manage your pain. This might include non-medical pain relief techniques or injectable pain relief such as epidural analgesia. ? How you and your baby will  be monitored during labor and delivery. ? Who may be in the labor and delivery room with you. ? Your feelings about surgical delivery of your baby (cesarean delivery, or C-section) if this becomes necessary. ? Your feelings about receiving donated blood through an IV tube (blood transfusion) if this becomes necessary.  Whether you are able: ? To take pictures or videos of the birth. ? To eat during labor and delivery. ? To move around, walk, or change positions during labor and delivery.  What to expect after your baby is born, such as: ? Whether delayed umbilical cord clamping and cutting is offered. ? Who will care for your baby right after birth. ? Medicines or tests that may be recommended for your baby. ? Whether breastfeeding is supported in your hospital or birth center. ? How long you will be in the hospital or birth center.  How any medical conditions you have may affect your baby or your labor and delivery experience.  To prepare for your baby's birth, you should also:  Attend all of your health care visits before delivery (prenatal visits) as recommended by your health care provider. This is important.  Prepare your home for your baby's arrival. Make sure that you have: ? Diapers. ? Baby clothing. ? Feeding equipment. ? Safe sleeping arrangements for you and your baby.  Install a car seat in your vehicle. Have your car seat checked by a certified car seat installer to make sure that it is installed safely.  Think about who will help you with your new baby at home for at least the first several weeks after delivery.  What can I expect when I arrive at the birth center or hospital? Once you are in labor and have been admitted into the hospital or birth center,  your health care provider may:  Review your pregnancy history and any concerns you have.  Insert an IV tube into one of your veins. This is used to give you fluids and medicines.  Check your blood pressure, pulse, temperature, and heart rate (vital signs).  Check whether your bag of water (amniotic sac) has broken (ruptured).  Talk with you about your birth plan and discuss pain control options.  Monitoring Your health care provider may monitor your contractions (uterine monitoring) and your baby's heart rate (fetal monitoring). You may need to be monitored:  Often, but not continuously (intermittently).  All the time or for long periods at a time (continuously). Continuous monitoring may be needed if: ? You are taking certain medicines, such as medicine to relieve pain or make your contractions stronger. ? You have pregnancy or labor complications.  Monitoring may be done by:  Placing a special stethoscope or a handheld monitoring device on your abdomen to check your baby's heartbeat, and feeling your abdomen for contractions. This method of monitoring does not continuously record your baby's heartbeat or your contractions.  Placing monitors on your abdomen (external monitors) to record your baby's heartbeat and the frequency and length of contractions. You may not have to wear external monitors all the time.  Placing monitors inside of your uterus (internal monitors) to record your baby's heartbeat and the frequency, length, and strength of your contractions. ? Your health care provider may use internal monitors if he or she needs more information about the strength of your contractions or your baby's heart rate. ? Internal monitors are put in place by passing a thin, flexible wire through your vagina and into your uterus. Depending on the  type of monitor, it may remain in your uterus or on your baby's head until birth. ? Your health care provider will discuss the benefits and risks  of internal monitoring with you and will ask for your permission before inserting the monitors.  Telemetry. This is a type of continuous monitoring that can be done with external or internal monitors. Instead of having to stay in bed, you are able to move around during telemetry. Ask your health care provider if telemetry is an option for you.  Physical exam Your health care provider may perform a physical exam. This may include:  Checking whether your baby is positioned: ? With the head toward your vagina (head-down). This is most common. ? With the head toward the top of your uterus (head-up or breech). If your baby is in a breech position, your health care provider may try to turn your baby to a head-down position so you can deliver vaginally. If it does not seem that your baby can be born vaginally, your provider may recommend surgery to deliver your baby. In rare cases, you may be able to deliver vaginally if your baby is head-up (breech delivery). ? Lying sideways (transverse). Babies that are lying sideways cannot be delivered vaginally.  Checking your cervix to determine: ? Whether it is thinning out (effacing). ? Whether it is opening up (dilating). ? How low your baby has moved into your birth canal.  What are the three stages of labor and delivery?  Normal labor and delivery is divided into the following three stages: Stage 1  Stage 1 is the longest stage of labor, and it can last for hours or days. Stage 1 includes: ? Early labor. This is when contractions may be irregular, or regular and mild. Generally, early labor contractions are more than 10 minutes apart. ? Active labor. This is when contractions get longer, more regular, more frequent, and more intense. ? The transition phase. This is when contractions happen very close together, are very intense, and may last longer than during any other part of labor.  Contractions generally feel mild, infrequent, and irregular at  first. They get stronger, more frequent (about every 2-3 minutes), and more regular as you progress from early labor through active labor and transition.  Many women progress through stage 1 naturally, but you may need help to continue making progress. If this happens, your health care provider may talk with you about: ? Rupturing your amniotic sac if it has not ruptured yet. ? Giving you medicine to help make your contractions stronger and more frequent.  Stage 1 ends when your cervix is completely dilated to 4 inches (10 cm) and completely effaced. This happens at the end of the transition phase. Stage 2  Once your cervix is completely effaced and dilated to 4 inches (10 cm), you may start to feel an urge to push. It is common for the body to naturally take a rest before feeling the urge to push, especially if you received an epidural or certain other pain medicines. This rest period may last for up to 1-2 hours, depending on your unique labor experience.  During stage 2, contractions are generally less painful, because pushing helps relieve contraction pain. Instead of contraction pain, you may feel stretching and burning pain, especially when the widest part of your baby's head passes through the vaginal opening (crowning).  Your health care provider will closely monitor your pushing progress and your baby's progress through the vagina during stage 2.  Your health care provider may massage the area of skin between your vaginal opening and anus (perineum) or apply warm compresses to your perineum. This helps it stretch as the baby's head starts to crown, which can help prevent perineal tearing. ? In some cases, an incision may be made in your perineum (episiotomy) to allow the baby to pass through the vaginal opening. An episiotomy helps to make the opening of the vagina larger to allow more room for the baby to fit through.  It is very important to breathe and focus so your health care provider  can control the delivery of your baby's head. Your health care provider may have you decrease the intensity of your pushing, to help prevent perineal tearing.  After delivery of your baby's head, the shoulders and the rest of the body generally deliver very quickly and without difficulty.  Once your baby is delivered, the umbilical cord may be cut right away, or this may be delayed for 1-2 minutes, depending on your baby's health. This may vary among health care providers, hospitals, and birth centers.  If you and your baby are healthy enough, your baby may be placed on your chest or abdomen to help maintain the baby's temperature and to help you bond with each other. Some mothers and babies start breastfeeding at this time. Your health care team will dry your baby and help keep your baby warm during this time.  Your baby may need immediate care if he or she: ? Showed signs of distress during labor. ? Has a medical condition. ? Was born too early (prematurely). ? Had a bowel movement before birth (meconium). ? Shows signs of difficulty transitioning from being inside the uterus to being outside of the uterus. If you are planning to breastfeed, your health care team will help you begin a feeding. Stage 3  The third stage of labor starts immediately after the birth of your baby and ends after you deliver the placenta. The placenta is an organ that develops during pregnancy to provide oxygen and nutrients to your baby in the womb.  Delivering the placenta may require some pushing, and you may have mild contractions. Breastfeeding can stimulate contractions to help you deliver the placenta.  After the placenta is delivered, your uterus should tighten (contract) and become firm. This helps to stop bleeding in your uterus. To help your uterus contract and to control bleeding, your health care provider may: ? Give you medicine by injection, through an IV tube, by mouth, or through your rectum  (rectally). ? Massage your abdomen or perform a vaginal exam to remove any blood clots that are left in your uterus. ? Empty your bladder by placing a thin, flexible tube (catheter) into your bladder. ? Encourage you to breastfeed your baby. After labor is over, you and your baby will be monitored closely to ensure that you are both healthy until you are ready to go home. Your health care team will teach you how to care for yourself and your baby. This information is not intended to replace advice given to you by your health care provider. Make sure you discuss any questions you have with your health care provider. Document Released: 04/28/2008 Document Revised: 02/07/2016 Document Reviewed: 08/04/2015 Elsevier Interactive Patient Education  2018 [place] [address] [place], [place]  [address] Phone - [phone]   Fax - [phone]  [place]  [address] [place], [place] [address] Phone - [phone]   Fax - [place] [address] [place], [place]  [address] Phone - [phone]   Fax - [phone]  [place]. [address] [place] [address] [place], [place]  [address] Phone - [phone]   Fax - [phone]  [place] [address] [place], [place]  [address] Phone - [phone]   Fax - [phone]  [facility] [address] [place], [place]  [address] Phone - [phone]   Fax - [place] [address], [place], [place]  [address] Phone - [phone]   Fax - [phone]  [place] [address] [place], [place] [address] [place], [place]  [address] Phone - [phone]   Fax - [place] [address] [place], [place]  [address] Phone - [phone]   Fax  - [phone] [facility] [place]. [address] [place], [place]  [address] Phone - [phone]   Fax - [phone]  [address] [place], [place]  [address] Phone - [phone]   Fax - [phone]  [facility] AT [place], [place], [place]  [address] Phone - [phone]   Fax - [place] [address] [place], [place]  [address] Phone - [phone]   Fax - [phone]  [facility] [address] [place], [place]  [address] Phone - [place], [place]  [address] Phone - [phone]   Fax - [place] [address] [place], [place]  [address] Phone - [phone]   Fax - [phone]  [place] [address] [place], [place]  [address] Phone - [phone]   Fax - [place]. [place], [place]  [address] Phone - [phone]   Fax - [place], [place], [place]  [address] Phone - [phone]   Fax - [place] [address], [place], [place]  [address] Phone - [phone]   Fax - [phone]  [place] RUBIN [address], [place], [place]  [address] Phone - [phone]   Fax - [place] [address] [place], [place]  [address] Phone - [phone]   Fax - [phone]  [place] [address] [place], [place]  [address] Phone - [phone]   Fax - [phone] [place] [address] [place], [place]  [address] Phone - [phone]   Fax - [place] [address] [place], [place]  [address] Phone - [phone]   Fax -  [place] [address], [place],   [address] Phone - [phone]   Fax - [phone]  [place] MD [address] [place] [address] Phone [phone]  Fax [phone]

## 2017-06-30 NOTE — Progress Notes (Signed)
   PRENATAL VISIT NOTE  Subjective:  Sonya Frazier is a 21 y.o. G2P0010 at 3059w5d being seen today for ongoing prenatal care.  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  Patient reports no bleeding, no contractions, no cramping, no leaking and vertigo.  Contractions: Not present. Vag. Bleeding: None.  Movement: Present. Denies leaking of fluid.   The following portions of the patient's history were reviewed and updated as appropriate: allergies, current medications, past family history, past medical history, past social history, past surgical history and problem list. Problem list updated.  Objective:   Vitals:   06/30/17 0938  BP: 120/75  Pulse: 96  Weight: 149 lb 6.4 oz (67.8 kg)    Fetal Status: Fetal Heart Rate (bpm): 145; doppler Fundal Height: 31 cm Movement: Present     General:  Alert, oriented and cooperative. Patient is in no acute distress.  Skin: Skin is warm and dry. No rash noted.   Cardiovascular: Normal heart rate noted  Respiratory: Normal respiratory effort, no problems with respiration noted  Abdomen: Soft, gravid, appropriate for gestational age.  Pain/Pressure: Absent     Pelvic: Cervical exam deferred        Extremities: Normal range of motion.  Edema: Trace  Mental Status:  Normal mood and affect. Normal behavior. Normal judgment and thought content.   Assessment and Plan:  Pregnancy: G2P0010 at 2459w5d  1. Supervision of other normal pregnancy, antepartum     Doing well  2. Low vitamin D level     Taking vitamin d weekly  3. Maternal iron deficiency anemia affecting pregnancy in third trimester, antepartum     Is not taking OTC iron or Bloom.  Repeat CBC today.  Iron rich foods  encouraged.  - CBC  Preterm labor symptoms and general obstetric precautions including but not limited to vaginal bleeding, contractions, leaking of fluid and fetal movement were reviewed in detail with the patient. Please refer to After Visit Summary for other counseling recommendations.  Return in about 4 weeks (around 07/28/2017) for ROB, babyscripts, GBS.   Roe Coombsachelle A Kalum Minner, CNM

## 2017-07-01 LAB — CBC
HEMOGLOBIN: 8.9 g/dL — AB (ref 11.1–15.9)
Hematocrit: 27.3 % — ABNORMAL LOW (ref 34.0–46.6)
MCH: 27.2 pg (ref 26.6–33.0)
MCHC: 32.6 g/dL (ref 31.5–35.7)
MCV: 84 fL (ref 79–97)
PLATELETS: 261 10*3/uL (ref 150–379)
RBC: 3.27 x10E6/uL — AB (ref 3.77–5.28)
RDW: 13.7 % (ref 12.3–15.4)
WBC: 12.3 10*3/uL — AB (ref 3.4–10.8)

## 2017-07-03 ENCOUNTER — Other Ambulatory Visit: Payer: Self-pay | Admitting: Certified Nurse Midwife

## 2017-07-03 DIAGNOSIS — D509 Iron deficiency anemia, unspecified: Secondary | ICD-10-CM

## 2017-07-03 DIAGNOSIS — O99013 Anemia complicating pregnancy, third trimester: Principal | ICD-10-CM

## 2017-07-19 ENCOUNTER — Encounter (HOSPITAL_COMMUNITY): Payer: Self-pay

## 2017-07-19 ENCOUNTER — Ambulatory Visit (HOSPITAL_COMMUNITY)
Admission: RE | Admit: 2017-07-19 | Discharge: 2017-07-19 | Disposition: A | Discharge status: Home / Self Care | Payer: Medicaid Other | Source: Ambulatory Visit | Attending: Certified Nurse Midwife | Admitting: Certified Nurse Midwife

## 2017-07-19 DIAGNOSIS — O358XX0 Maternal care for other (suspected) fetal abnormality and damage, not applicable or unspecified: Secondary | ICD-10-CM | POA: Diagnosis not present

## 2017-07-19 DIAGNOSIS — Z3A33 33 weeks gestation of pregnancy: Secondary | ICD-10-CM | POA: Insufficient documentation

## 2017-07-19 DIAGNOSIS — O35EXX0 Maternal care for other (suspected) fetal abnormality and damage, fetal genitourinary anomalies, not applicable or unspecified: Secondary | ICD-10-CM

## 2017-07-22 ENCOUNTER — Other Ambulatory Visit: Payer: Self-pay | Admitting: Certified Nurse Midwife

## 2017-07-22 DIAGNOSIS — Z3480 Encounter for supervision of other normal pregnancy, unspecified trimester: Secondary | ICD-10-CM

## 2017-07-28 ENCOUNTER — Encounter: Payer: Self-pay | Admitting: Certified Nurse Midwife

## 2017-07-30 ENCOUNTER — Telehealth: Payer: Self-pay | Admitting: Hematology

## 2017-07-30 NOTE — Telephone Encounter (Signed)
Spoke with patient re 08/11/17 appointment with Dr. Candise CheKale @ 10 am. Patient given appointment date/time/location. Demographic/insurance confirmed.  Spoke with referring office and also send Epic message to referring confirming appointment.

## 2017-08-04 ENCOUNTER — Other Ambulatory Visit (HOSPITAL_COMMUNITY)
Admission: RE | Admit: 2017-08-04 | Discharge: 2017-08-04 | Disposition: A | Discharge status: Home / Self Care | Payer: Medicaid Other | Source: Ambulatory Visit | Attending: Obstetrics and Gynecology | Admitting: Obstetrics and Gynecology

## 2017-08-04 ENCOUNTER — Encounter: Payer: Self-pay | Admitting: Obstetrics and Gynecology

## 2017-08-04 ENCOUNTER — Ambulatory Visit (INDEPENDENT_AMBULATORY_CARE_PROVIDER_SITE_OTHER): Payer: Medicaid Other | Admitting: Obstetrics and Gynecology

## 2017-08-04 VITALS — BP 122/57 | HR 96 | Wt 149.1 lb

## 2017-08-04 DIAGNOSIS — D509 Iron deficiency anemia, unspecified: Secondary | ICD-10-CM

## 2017-08-04 DIAGNOSIS — Z3A35 35 weeks gestation of pregnancy: Secondary | ICD-10-CM | POA: Insufficient documentation

## 2017-08-04 DIAGNOSIS — O35EXX0 Maternal care for other (suspected) fetal abnormality and damage, fetal genitourinary anomalies, not applicable or unspecified: Secondary | ICD-10-CM

## 2017-08-04 DIAGNOSIS — O99013 Anemia complicating pregnancy, third trimester: Secondary | ICD-10-CM | POA: Diagnosis not present

## 2017-08-04 DIAGNOSIS — Z3483 Encounter for supervision of other normal pregnancy, third trimester: Secondary | ICD-10-CM | POA: Diagnosis present

## 2017-08-04 DIAGNOSIS — O358XX0 Maternal care for other (suspected) fetal abnormality and damage, not applicable or unspecified: Secondary | ICD-10-CM

## 2017-08-04 DIAGNOSIS — Z3480 Encounter for supervision of other normal pregnancy, unspecified trimester: Secondary | ICD-10-CM

## 2017-08-04 LAB — OB RESULTS CONSOLE GC/CHLAMYDIA: Gonorrhea: NEGATIVE

## 2017-08-04 NOTE — Progress Notes (Signed)
Pt reports intermittent abdominal pain and pressure.

## 2017-08-04 NOTE — Progress Notes (Signed)
   PRENATAL VISIT NOTE  Subjective:  Sonya Frazier is a 22 y.o. G2P0010 at 6368w5d being seen today for ongoing prenatal care.  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  Patient reports no complaints.  Contractions: Not present. Vag. Bleeding: None.  Movement: Present. Denies leaking of fluid.   The following portions of the patient's history were reviewed and updated as appropriate: allergies, current medications, past family history, past medical history, past social history, past surgical history and problem list. Problem list updated.  Objective:   Vitals:   08/04/17 0910  BP: (!) 122/57  Pulse: 96  Weight: 149 lb 1.6 oz (67.6 kg)    Fetal Status: Fetal Heart Rate (bpm): 147(Simultaneous filing. User may not have seen previous data.) Fundal Height: 35 cm Movement: Present  Presentation: Vertex  General:  Alert, oriented and cooperative. Patient is in no acute distress.  Skin: Skin is warm and dry. No rash noted.   Cardiovascular: Normal heart rate noted  Respiratory: Normal respiratory effort, no problems with respiration noted  Abdomen: Soft, gravid, appropriate for gestational age.  Pain/Pressure: Absent     Pelvic: Cervical exam performed Dilation: Closed Effacement (%): Thick Station: Ballotable  Extremities: Normal range of motion.  Edema: Trace  Mental Status:  Normal mood and affect. Normal behavior. Normal judgment and thought content.   Assessment and Plan:  Pregnancy: G2P0010 at 7768w5d  1. Supervision of other normal pregnancy, antepartum Patient is doing well without complaint Cultures collected - Strep Gp B NAA - Cervicovaginal ancillary only  2. Anomaly of kidney of fetus in singleton  pregnancy Resolved on last ultrasound  3. Maternal iron deficiency anemia affecting pregnancy in third trimester, antepartum Patient taking prenatal vitamins and states that she is consuming iron rich foods Patient scheduled to see hematology on 1/9  Preterm labor symptoms and general obstetric precautions including but not limited to vaginal bleeding, contractions, leaking of fluid and fetal movement were reviewed in detail with the patient. Please refer to After Visit Summary for other counseling recommendations.  Return in about 2 weeks (around 08/18/2017) for ROB.   Catalina AntiguaPeggy Drevin Ortner, MD

## 2017-08-05 LAB — CERVICOVAGINAL ANCILLARY ONLY
Bacterial vaginitis: NEGATIVE
CANDIDA VAGINITIS: POSITIVE — AB
Chlamydia: NEGATIVE
Neisseria Gonorrhea: NEGATIVE
TRICH (WINDOWPATH): POSITIVE — AB

## 2017-08-06 LAB — STREP GP B NAA: Strep Gp B NAA: POSITIVE — AB

## 2017-08-09 ENCOUNTER — Encounter: Payer: Self-pay | Admitting: Obstetrics and Gynecology

## 2017-08-09 ENCOUNTER — Telehealth: Payer: Self-pay | Admitting: Hematology

## 2017-08-09 ENCOUNTER — Other Ambulatory Visit: Payer: Self-pay | Admitting: Obstetrics and Gynecology

## 2017-08-09 DIAGNOSIS — B951 Streptococcus, group B, as the cause of diseases classified elsewhere: Secondary | ICD-10-CM | POA: Insufficient documentation

## 2017-08-09 MED ORDER — FLUCONAZOLE 150 MG PO TABS: 150.0000 mg | ORAL_TABLET | Freq: Once | ORAL | 0 refills | Status: AC

## 2017-08-09 MED ORDER — METRONIDAZOLE 500 MG PO TABS: 500.0000 mg | ORAL_TABLET | Freq: Two times a day (BID) | ORAL | 0 refills | Status: DC

## 2017-08-09 NOTE — Telephone Encounter (Signed)
Moved patient's appointment to 12:20 because of GK being in a meeting. Spoke with patient and they are aware of the appts.

## 2017-08-11 ENCOUNTER — Inpatient Hospital Stay: Payer: Medicaid Other | Attending: Hematology | Admitting: Hematology

## 2017-08-11 ENCOUNTER — Inpatient Hospital Stay: Payer: Medicaid Other

## 2017-08-11 ENCOUNTER — Telehealth: Payer: Self-pay | Admitting: Hematology

## 2017-08-11 ENCOUNTER — Encounter: Payer: Self-pay | Admitting: Hematology

## 2017-08-11 VITALS — BP 119/69 | HR 96 | Temp 97.7°F | Resp 18 | Ht 61.0 in | Wt 148.4 lb

## 2017-08-11 DIAGNOSIS — O99013 Anemia complicating pregnancy, third trimester: Secondary | ICD-10-CM

## 2017-08-11 DIAGNOSIS — E876 Hypokalemia: Secondary | ICD-10-CM

## 2017-08-11 DIAGNOSIS — D509 Iron deficiency anemia, unspecified: Secondary | ICD-10-CM

## 2017-08-11 DIAGNOSIS — E538 Deficiency of other specified B group vitamins: Secondary | ICD-10-CM

## 2017-08-11 LAB — CMP (CANCER CENTER ONLY)
ALBUMIN: 2.9 g/dL — AB (ref 3.5–5.0)
ALT: 6 U/L (ref 0–55)
AST: 12 U/L (ref 5–34)
Alkaline Phosphatase: 171 U/L — ABNORMAL HIGH (ref 40–150)
Anion gap: 8 (ref 3–11)
BILIRUBIN TOTAL: 0.4 mg/dL (ref 0.2–1.2)
BUN: 4 mg/dL — AB (ref 7–26)
CO2: 21 mmol/L — ABNORMAL LOW (ref 22–29)
Calcium: 8.9 mg/dL (ref 8.4–10.4)
Chloride: 109 mmol/L (ref 98–109)
Creatinine: 0.7 mg/dL (ref 0.70–1.30)
GFR, Est AFR Am: >60 mL/min (ref >60)
GFR, Estimated: >60 mL/min (ref >60)
GLUCOSE: 76 mg/dL (ref 70–140)
POTASSIUM: 3 mmol/L — AB (ref 3.3–4.7)
Sodium: 138 mmol/L (ref 136–145)
TOTAL PROTEIN: 7.2 g/dL (ref 6.4–8.3)

## 2017-08-11 LAB — CBC WITH DIFFERENTIAL (CANCER CENTER ONLY)
Abs Granulocyte: 10 10*3/uL — ABNORMAL HIGH (ref 1.5–6.5)
BASOS PCT: 0 %
Basophils Absolute: 0 10*3/uL (ref 0.0–0.1)
EOS ABS: 0.1 10*3/uL (ref 0.0–0.5)
EOS PCT: 1 %
HCT: 27.9 % — ABNORMAL LOW (ref 34.8–46.6)
Hemoglobin: 9.2 g/dL — ABNORMAL LOW (ref 11.6–15.9)
LYMPHS PCT: 16 %
Lymphs Abs: 2.1 10*3/uL (ref 0.9–3.3)
MCH: 26.9 pg (ref 25.1–34.0)
MCHC: 33 g/dL (ref 31.5–36.0)
MCV: 81.6 fL (ref 79.5–101.0)
Monocytes Absolute: 1.3 10*3/uL — ABNORMAL HIGH (ref 0.1–0.9)
Monocytes Relative: 10 %
Neutro Abs: 10 10*3/uL — ABNORMAL HIGH (ref 1.5–6.5)
Neutrophils Relative %: 73 %
PLATELETS: 306 10*3/uL (ref 145–400)
RBC: 3.42 MIL/uL — AB (ref 3.70–5.45)
RDW: 13.6 % (ref 11.2–16.1)
WBC: 13.6 10*3/uL — AB (ref 4.0–10.3)

## 2017-08-11 LAB — IRON AND TIBC
Iron: 31 ug/dL — ABNORMAL LOW (ref 41–142)
Saturation Ratios: 7 % — ABNORMAL LOW (ref 21–57)
TIBC: 468 ug/dL — ABNORMAL HIGH (ref 236–444)
UIBC: 437 ug/dL

## 2017-08-11 LAB — FERRITIN: Ferritin: 7 ng/mL — ABNORMAL LOW (ref 9–269)

## 2017-08-11 LAB — LACTATE DEHYDROGENASE: LDH: 123 U/L — AB (ref 125–245)

## 2017-08-11 LAB — RETICULOCYTES
RBC.: 3.42 MIL/uL — ABNORMAL LOW (ref 3.70–5.45)
RETIC COUNT ABSOLUTE: 71.8 10*3/uL (ref 33.7–90.7)
RETIC CT PCT: 2.1 % (ref 0.7–2.1)

## 2017-08-11 LAB — VITAMIN B12: VITAMIN B 12: 111 pg/mL — AB (ref 180–914)

## 2017-08-11 MED ORDER — POLYSACCHARIDE IRON COMPLEX 150 MG PO CAPS: 150.0000 mg | ORAL_CAPSULE | Freq: Two times a day (BID) | ORAL | 1 refills | Status: DC

## 2017-08-11 MED ORDER — POTASSIUM CHLORIDE CRYS ER 20 MEQ PO TBCR: 20.0000 meq | EXTENDED_RELEASE_TABLET | Freq: Every day | ORAL | 0 refills | Status: DC

## 2017-08-11 NOTE — Patient Instructions (Signed)
Thank you for choosing [facility] to provide your oncology and hematology care.  To afford each patient quality time with our providers, please arrive 30 minutes before your scheduled appointment time.  If you arrive late for your appointment, you may be asked to reschedule.  We strive to give you quality time with our providers, and arriving late affects you and other patients whose appointments are after yours.   If you are a no show for multiple scheduled visits, you may be dismissed from the clinic at the providers discretion.    Again, thank you for choosing [facility], our hope is that these requests will decrease the amount of time that you wait before being seen by our physicians.  ______________________________________________________________________  Should you have questions after your visit to the [facility], please contact our office at [phone] between the hours of 8:30 and 4:30 p.m.    Voicemails left after 4:30p.m will not be returned until the following business day.    For prescription refill requests, please have your pharmacy contact us directly.  Please also try to allow 48 hours for prescription requests.    Please contact the scheduling department for questions regarding scheduling.  For scheduling of procedures such as PET scans, CT scans, MRI, Ultrasound, etc please contact central scheduling at [phone].    Resources For Cancer Patients and Caregivers:   Oncolink.org:  A wonderful resource for patients and healthcare providers for information regarding your disease, ways to tract your treatment, what to expect, etc.     American Cancer Society:  800-227-2345  Can help patients locate various types of support and financial assistance  Cancer Care: 1-800-813-HOPE (4673) Provides financial assistance, online support groups, medication/co-pay assistance.    [place] DSS:  336-641-3447 Where to apply for food  stamps, Medicaid, and utility assistance  Medicare Rights Center: 800-333-4114 Helps people with Medicare understand their rights and benefits, navigate the Medicare system, and secure the quality healthcare they deserve  SCAT: 336-333-6589 [place] [employer]'s shared-ride transportation service for eligible riders who have a disability that prevents them from riding the fixed route bus.    For additional information on assistance programs please contact our social worker:   Grier Hock/Abigail Elmore:  [phone]            

## 2017-08-11 NOTE — Progress Notes (Signed)
Per Dr. Candise CheKale, sw pt regarding labs.  Pt informed to pick up rx for potassium for 7 days, increase potassium rich foods, and recheck levels with OBGYN.  Pt also informed that iron levels remain low, instructed to begin iron as prescribed.  Pt verbalized understanding.  Potassium rx sent to preferred pharmacy.

## 2017-08-11 NOTE — Telephone Encounter (Signed)
Scheduled appt per 1/9 los - Gave patient AVS and calender per los.  

## 2017-08-11 NOTE — Progress Notes (Signed)
HEMATOLOGY/ONCOLOGY CONSULTATION NOTE  Date of Service: 08/11/2017  Patient Care Team: Roe Coombs, CNM as PCP - General (Certified Nurse Midwife)  CHIEF COMPLAINTS/PURPOSE OF CONSULTATION:  Iron-deficient anemia in 3 rd trimester of pregnancy  HISTORY OF PRESENTING ILLNESS:   Sonya Frazier is a wonderful 22 y.o. female who has been referred to [place] by her OBGYN CNM [place] under Dr. Jolayne Panther for evaluation and management of anemia.   She presents to the clinic today [redacted] weeks pregnant accompanied by her niece. She notes she never knew she was anemic until she was pregnant. The second visit to her OBGYN during her second trimester labs showed anemia. She was put on oral iron ferrous sulfate for 2 weeks, first TID then BID, before she nauseous and stopped taking the po potassium in about 2 weeks after starting it. She was told to increase iron in her diet at that point. She denies bleeding anywhere - no melena/hematocheziahematuria/vaginal bleeding.   She notes her diet during pregnancy has remained normal. She has been on prenatal vitamins with iron since the beginning of her pregnancy.   She notes she had heavy periods in the past and was never treated with anemia in the past. She denies family history of anemia, blood disorders or She has a normal diet with no intolerance.   On review of symptoms, pt notes 1 week ago she had a spell of weakness and numbness in her right arm- now resolved. She notes she has been breathing heavy more recently. She notes to having recurrence migraine which she had in the past.  She denies blurred vision, or neck pain.    MEDICAL HISTORY:  Past Medical History:  Diagnosis Date  . Medical history non-contributory     SURGICAL HISTORY: Past Surgical History:  Procedure Laterality Date  . WISDOM TOOTH EXTRACTION      SOCIAL HISTORY: Social History   Socioeconomic History  . Marital status: Single    Spouse name: Not on file  .  Number of children: Not on file  . Years of education: Not on file  . Highest education level: Not on file  Social Needs  . Financial resource strain: Not on file  . Food insecurity - worry: Not on file  . Food insecurity - inability: Not on file  . Transportation needs - medical: Not on file  . Transportation needs - non-medical: Not on file  Occupational History  . Not on file  Tobacco Use  . Smoking status: Former Games developer  . Smokeless tobacco: Never Used  Substance and Sexual Activity  . Alcohol use: No    Comment: occ  . Drug use: No    Comment: Quit approx 1 year ago  . Sexual activity: Yes    Birth control/protection: None  Other Topics Concern  . Not on file  Social History Narrative  . Not on file    FAMILY HISTORY: Family History  Problem Relation Age of Onset  . Asthma Mother   . Cancer Maternal Aunt     ALLERGIES:  has No Known Allergies.  MEDICATIONS:  Current Outpatient Medications  Medication Sig Dispense Refill  . metroNIDAZOLE (FLAGYL) 500 MG tablet Take 1 tablet (500 mg total) by mouth 2 (two) times daily. 14 tablet 0  . ondansetron (ZOFRAN ODT) 8 MG disintegrating tablet Take 1 tablet (8 mg total) by mouth 2 (two) times daily. Will cause constipation.  Take a stool softener. 12 tablet 0  . Prenat-FeCbn-FeAspGl-FA-Omega (OB COMPLETE  PETITE) 35-5-1-200 MG CAPS Take 1 tablet by mouth daily. 30 capsule 12  . terconazole (TERAZOL 7) 0.4 % vaginal cream Place 1 applicator vaginally at bedtime. Use for 7 days. 45 g 0  . Vitamin D, Ergocalciferol, (DRISDOL) 50000 units CAPS capsule Take 1 capsule (50,000 Units total) by mouth every 7 (seven) days. 30 capsule 2  . iron polysaccharides (NIFEREX) 150 MG capsule Take 1 capsule (150 mg total) by mouth 2 (two) times daily. 60 capsule 1  . potassium chloride SA (K-DUR,KLOR-CON) 20 MEQ tablet Take 1 tablet (20 mEq total) by mouth daily. 7 tablet 0   No current facility-administered medications for this visit.      REVIEW OF SYSTEMS:    10 Point review of Systems was done is negative except as noted above.  PHYSICAL EXAMINATION: ECOG PERFORMANCE STATUS: 1 - Symptomatic but completely ambulatory  . Vitals:   08/11/17 1230  BP: 119/69  Pulse: 96  Resp: 18  Temp: 97.7 F (36.5 C)  SpO2: 100%   Filed Weights   08/11/17 1230  Weight: 148 lb 6.4 oz (67.3 kg)   .Body mass index is 28.04 kg/m.  GENERAL:alert, in no acute distress and comfortable SKIN: no acute rashes, no significant lesions EYES: conjunctiva are pink and non-injected, sclera anicteric OROPHARYNX: MMM, no exudates, no oropharyngeal erythema or ulceration NECK: supple, no JVD LYMPH:  no palpable lymphadenopathy in the cervical, axillary or inguinal regions LUNGS: clear to auscultation b/l with normal respiratory effort HEART: regular rate & rhythm ABDOMEN:  normoactive bowel sounds , distended from gravid uterus. Non tender Extremity: no pedal edema PSYCH: alert & oriented x 3 with fluent speech NEURO: no focal motor/sensory deficits  LABORATORY DATA:  I have reviewed the data as listed  Component     Latest Ref Rng & Units 08/11/2017  WBC Count     4.0 - 10.3 K/uL 13.6 (H)  RBC     3.70 - 5.45 MIL/uL 3.42 (L)  Hemoglobin     11.6 - 15.9 g/dL 9.2 (L)  HCT     16.134.8 - 46.6 % 27.9 (L)  MCV     79.5 - 101.0 fL 81.6  MCH     25.1 - 34.0 pg 26.9  MCHC     31.5 - 36.0 g/dL 09.633.0  RDW     04.511.2 - 40.916.1 % 13.6  Platelet Count     145 - 400 K/uL 306  Neutrophils     % 73  NEUT#     1.5 - 6.5 K/uL 10.0 (H)  Abs Granulocyte     1.5 - 6.5 K/uL 10.0 (H)  Lymphocytes     % 16  Lymphocyte #     0.9 - 3.3 K/uL 2.1  Monocytes Relative     % 10  Monocyte #     0.1 - 0.9 K/uL 1.3 (H)  Eosinophil     % 1  Eosinophils Absolute     0.0 - 0.5 K/uL 0.1  Basophil     % 0  Basophils Absolute     0.0 - 0.1 K/uL 0.0  Sodium     136 - 145 mmol/L 138  Potassium     3.3 - 4.7 mmol/L 3.0 (LL)  Chloride     98 - 109  mmol/L 109  CO2     22 - 29 mmol/L 21 (L)  Glucose     70 - 140 mg/dL 76  BUN     7 - 26  mg/dL 4 (L)  Creatinine     4.09 - 1.30 mg/dL 8.11  Calcium     8.4 - 10.4 mg/dL 8.9  Total Protein     6.4 - 8.3 g/dL 7.2  Albumin     3.5 - 5.0 g/dL 2.9 (L)  AST     5 - 34 U/L 12  ALT     0 - 55 U/L 6  Alkaline Phosphatase     40 - 150 U/L 171 (H)  Total Bilirubin     0.2 - 1.2 mg/dL 0.4  GFR, Est Non Af Am     >60 mL/min >60  GFR, Est AFR Am     >60 mL/min >60  Anion gap     3 - 11 8  Iron     41 - 142 ug/dL 31 (L)  TIBC     914 - 444 ug/dL 782 (H)  Saturation Ratios     21 - 57 % 7 (L)  UIBC     ug/dL 956  Retic Ct Pct     0.7 - 2.1 % 2.1  RBC.     3.70 - 5.45 MIL/uL 3.42 (L)  Retic Count, Absolute     33.7 - 90.7 K/uL 71.8  Ferritin     9 - 269 ng/mL 7 (L)  LDH     125 - 245 U/L 123 (L)  Vitamin B12     180 - 914 pg/mL 111 (L)    CBC Latest Ref Rng & Units 08/11/2017 06/30/2017 06/02/2017  WBC 3.4 - 10.8 x10E3/uL - 12.3(H) 12.8(H)  Hemoglobin 11.1 - 15.9 g/dL - 8.9(L) 8.9(L)  Hematocrit 34.8 - 46.6 % 27.9(L) 27.3(L) 27.7(L)  Platelets 150 - 379 x10E3/uL - 261 290    . CMP Latest Ref Rng & Units 06/28/2016  Glucose 65 - 99 mg/dL 213(Y)  BUN 6 - 20 mg/dL 11  Creatinine 8.65 - 7.84 mg/dL 6.96  Sodium 295 - 284 mmol/L 138  Potassium 3.5 - 5.1 mmol/L 3.3(L)  Chloride 101 - 111 mmol/L 104  CO2 22 - 32 mmol/L 26  Calcium 8.9 - 10.3 mg/dL 9.2  Total Protein 6.5 - 8.1 g/dL 1.3(K)  Total Bilirubin 0.3 - 1.2 mg/dL 0.6  Alkaline Phos 38 - 126 U/L 67  AST 15 - 41 U/L 17  ALT 14 - 54 U/L 12(L)   Outside labs 06/30/17      RADIOGRAPHIC STUDIES: I have personally reviewed the radiological images as listed and agreed with the findings in the report.  ASSESSMENT & PLAN:  Sonya Frazier is a 22 y.o. African-American female with    1. Anemia -  Maternal iron deficiency in 3rd trimester of pregnancy. Severe with ferritin of 7 with 7% iron  saturation. Plan -I discussed the possible cause of the iron deficiency anemia can primarily come from her gestation and/or related to previous lower levels related to untreated menorrhagia in the past.  -OBGYN previously treated her with ferrous sulfate but pt could not tolerate it and stopped after 2 weeks.  - in the setting of significant concurrent B12 deficiency cannot r/o an absorption issue or pernicious anemia. No overt symptoms suggestive of gluten intolerance.  PLAN -labs done today to evaluate status of her anemia. hgb a little better at 9.2 with iron level still very low. -I recommend iron polysaccharide 150mg  po BID with orange juice or food.  -If corrective oral iron is not enough we will consider IV iron after delivery. -no indication  for PRBC transfusion at this time -I reviewed side effects and if oral iron causes constipation she can take colace as needed.   -I encouraged her to contact her OBGYN or [facility] if she experiences severe fatigue or dizziness as she may need a blood transfusion.   #2 B12 deficiency ? GI absorption issues vs pernicious anemia Plan -start tking B12 SL SL daily in addition to prenatal vitamins -on next visit will check parietal cell ab and anti intrinsic factor antibody.  # hypokalemia -due to poor po intake -counseled on potassium rich foods -KCL po daily (especially since she is also needing B12 that can cause some hypokalemia as well) -f/u with PCP/OB-GYN to monitor and plan further potassium replacement as needed.  Prescribe Iron Polysaccharide BID today + KCL + B12 Labs today  RTC with Dr Candise Che in 6 weeks with labs   All of the patients questions were answered with apparent satisfaction. The patient knows to call the clinic with any problems, questions or concerns.  I spent 45 minutes counseling the patient face to face. The total time spent in the appointment was 60 minutes and more than 50% was  on counseling and direct patient cares.    Wyvonnia Lora MD MS AAHIVMS [facility] Hematology/Oncology Physician [facility]  (Office):       [phone] (Work cell):  [phone] (Fax):           [phone]  08/11/2017 12:51 PM  This document serves as a record of services personally performed by Wyvonnia Lora, MD. It was created on his behalf by Delphina Cahill, a trained medical scribe. The creation of this record is based on the scribe's personal observations and the provider's statements to them.   .I have reviewed the above documentation for accuracy and completeness, and I agree with the above. Johney Maine MD MS

## 2017-08-13 MED ORDER — B-12 1000 MCG SL SUBL: 1000.0000 ug | SUBLINGUAL_TABLET | Freq: Every day | SUBLINGUAL | 3 refills | Status: DC

## 2017-08-18 ENCOUNTER — Ambulatory Visit (INDEPENDENT_AMBULATORY_CARE_PROVIDER_SITE_OTHER): Payer: Medicaid Other | Admitting: Certified Nurse Midwife

## 2017-08-18 ENCOUNTER — Encounter: Payer: Self-pay | Admitting: Certified Nurse Midwife

## 2017-08-18 VITALS — BP 127/68 | HR 86 | Wt 154.8 lb

## 2017-08-18 DIAGNOSIS — O99013 Anemia complicating pregnancy, third trimester: Secondary | ICD-10-CM

## 2017-08-18 DIAGNOSIS — D509 Iron deficiency anemia, unspecified: Secondary | ICD-10-CM

## 2017-08-18 DIAGNOSIS — R7989 Other specified abnormal findings of blood chemistry: Secondary | ICD-10-CM

## 2017-08-18 DIAGNOSIS — Z3480 Encounter for supervision of other normal pregnancy, unspecified trimester: Secondary | ICD-10-CM

## 2017-08-18 DIAGNOSIS — B951 Streptococcus, group B, as the cause of diseases classified elsewhere: Secondary | ICD-10-CM

## 2017-08-18 DIAGNOSIS — Z3483 Encounter for supervision of other normal pregnancy, third trimester: Secondary | ICD-10-CM

## 2017-08-18 NOTE — Progress Notes (Signed)
   PRENATAL VISIT NOTE  Subjective:  Sonya Frazier is a 22 y.o. G2P0010 at 4929w5d being seen today for ongoing prenatal care.  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  Patient reports no bleeding, no contractions, no cramping, no leaking and pelvic pressure has increased, ankle swelling at night.  Contractions: Not present. Vag. Bleeding: None.  Movement: Present. Denies leaking of fluid.   The following portions of the patient's history were reviewed and updated as appropriate: allergies, current medications, past family history, past medical history, past social history, past surgical history and problem list. Problem list updated.  Objective:   Vitals:   08/18/17 0911  BP: 127/68  Pulse: 86  Weight: 154 lb 12.8 oz (70.2 kg)    Fetal Status: Fetal Heart Rate (bpm): 131; doppler Fundal Height: 36 cm Movement: Present  Presentation: Vertex  General:  Alert, oriented and cooperative. Patient is in no acute distress.  Skin: Skin is warm and dry. No rash noted.   Cardiovascular: Normal heart rate noted  Respiratory: Normal respiratory effort, no problems with respiration noted  Abdomen: Soft, gravid, appropriate for gestational age.  Pain/Pressure: Present     Pelvic: Cervical exam performed Dilation: Closed Effacement (%): Thick Station: -3  Extremities: Normal range of motion.  Edema: Trace  Mental Status:  Normal mood and affect. Normal behavior. Normal judgment and thought content.   Assessment and Plan:  Pregnancy: G2P0010 at 5929w5d  1. Supervision of other normal pregnancy, antepartum     Doing well  2. Positive GBS test     PCN for labor delivery  3. Maternal iron deficiency  anemia affecting pregnancy in third trimester, antepartum     Taking Bloom.   4. Low vitamin D level     Taking weekly vitamin D  Term labor symptoms and general obstetric precautions including but not limited to vaginal bleeding, contractions, leaking of fluid and fetal movement were reviewed in detail with the patient. Please refer to After Visit Summary for other counseling recommendations.  Return in about 1 week (around 08/25/2017) for ROB.   Roe Coombsachelle A Clariza Sickman, CNM

## 2017-08-18 NOTE — Patient Instructions (Signed)
AREA PEDIATRIC/FAMILY PRACTICE PHYSICIANS  [facility] [address], [address] [place], [place]  [address] Phone - [phone]   Fax - [phone]  [facility] [address] [place], [place] [address] Phone - [phone]   Fax - [phone]  [facility] [address] [place], [place]  [address] Phone - [phone]   Fax - [phone]  [facility] [address] [place], [place]  [address] Phone - [phone]   Fax - [phone]  [facility] [address] [place], [place]  [address] Phone - [phone]   Fax - [phone]  [facility] [address] [place], [place]  [address] Phone - [phone]   Fax - [phone]  [facility] [place] [address] [place], [place]  [address] Phone - [phone]   Fax - [phone]  [facility] [address] [place], [place]  [address] Phone - [phone]   Fax - [phone]  [facility] [address] [place], [place]  [address] Phone - [phone]   Fax - [phone] [facility] [address] [place], [place]  [address] Phone - [phone]   Fax - [phone]  [facility] [address] [place], [place]  [address] Phone - [phone]   Fax - [phone]  [facility] [address] [place], [place]  [address] Phone - [phone]   Fax - [phone]  [facility] [address] [place], [place]  [address] Phone - [phone]   Fax - [phone]  [facility] [address] [place], [place]  [address] Phone - [phone]  [facility] [address] [place], [place]  [address] Phone - [phone]   Fax - [phone]  [facility] [address] [place], [place]  [address] Phone - [phone]   Fax - [phone]  [facility] [address] [place], [place]  [address] Phone - [phone]   Fax - [phone]  [facility] [address] [place], [place]  [address] Phone - [phone]   Fax - [phone]   [facility] [address] [place], [place]  [address] Phone - [phone]   Fax - [phone]  [facility] [address] [place], [place]  [address] Phone - [phone]   Fax - [phone]  [facility] [address] [place], [place]  [address] Phone - [phone]   Fax - [phone]  [facility] [address] [place], [place]  [address] Phone - [phone]   Fax - [phone]  [facility] [address] , [place]  [address] Phone - [phone]   Fax - [phone] [facility] [address] [place], [place]  [address] Phone - [phone]   Fax - [phone]  [facility] [address] [place], [place]  [address] Phone - [phone]   Fax - [phone]  [facility] [address] [place], [place]  [address] Phone - [phone]   Fax - [phone]  [facility] Charlene Flemming MD [address] [place]  [address] Phone [phone]  Fax [phone]   

## 2017-08-18 NOTE — Progress Notes (Signed)
Pt c/o bilateral foot/ankle/calf swelling, constantly. She also c/o rectal/pelvic pain and pressure

## 2017-08-23 ENCOUNTER — Inpatient Hospital Stay (HOSPITAL_COMMUNITY)
Admission: AD | Admit: 2017-08-23 | Discharge: 2017-08-24 | Disposition: A | Discharge status: Home / Self Care | Payer: Medicaid Other | Source: Ambulatory Visit | Attending: Obstetrics and Gynecology | Admitting: Obstetrics and Gynecology

## 2017-08-23 DIAGNOSIS — O9989 Other specified diseases and conditions complicating pregnancy, childbirth and the puerperium: Secondary | ICD-10-CM

## 2017-08-23 DIAGNOSIS — O99891 Other specified diseases and conditions complicating pregnancy: Secondary | ICD-10-CM

## 2017-08-23 DIAGNOSIS — Z3480 Encounter for supervision of other normal pregnancy, unspecified trimester: Secondary | ICD-10-CM

## 2017-08-23 DIAGNOSIS — Z3A38 38 weeks gestation of pregnancy: Secondary | ICD-10-CM | POA: Insufficient documentation

## 2017-08-23 DIAGNOSIS — B951 Streptococcus, group B, as the cause of diseases classified elsewhere: Secondary | ICD-10-CM

## 2017-08-23 DIAGNOSIS — M549 Dorsalgia, unspecified: Secondary | ICD-10-CM

## 2017-08-23 DIAGNOSIS — O471 False labor at or after 37 completed weeks of gestation: Secondary | ICD-10-CM

## 2017-08-23 DIAGNOSIS — R0602 Shortness of breath: Secondary | ICD-10-CM | POA: Insufficient documentation

## 2017-08-23 DIAGNOSIS — Z87891 Personal history of nicotine dependence: Secondary | ICD-10-CM | POA: Insufficient documentation

## 2017-08-23 DIAGNOSIS — O26893 Other specified pregnancy related conditions, third trimester: Secondary | ICD-10-CM | POA: Insufficient documentation

## 2017-08-23 DIAGNOSIS — O26899 Other specified pregnancy related conditions, unspecified trimester: Secondary | ICD-10-CM

## 2017-08-23 NOTE — MAU Note (Signed)
Pt c/o back contractions tonight that started and stopped and started again. Her Aunt encouraged her to come to hosp. She reports fetal movement and denies any vag bleeding or leaking. She says that she has felt some shortness of breath throughout the week , sometimes it went away with sleep, sometimes with sitting upright.

## 2017-08-24 ENCOUNTER — Encounter (HOSPITAL_COMMUNITY): Payer: Self-pay | Admitting: *Deleted

## 2017-08-24 DIAGNOSIS — M549 Dorsalgia, unspecified: Secondary | ICD-10-CM | POA: Diagnosis present

## 2017-08-24 DIAGNOSIS — O26893 Other specified pregnancy related conditions, third trimester: Secondary | ICD-10-CM | POA: Diagnosis present

## 2017-08-24 DIAGNOSIS — O471 False labor at or after 37 completed weeks of gestation: Secondary | ICD-10-CM | POA: Diagnosis not present

## 2017-08-24 DIAGNOSIS — R0602 Shortness of breath: Secondary | ICD-10-CM | POA: Diagnosis not present

## 2017-08-24 DIAGNOSIS — Z87891 Personal history of nicotine dependence: Secondary | ICD-10-CM | POA: Diagnosis not present

## 2017-08-24 DIAGNOSIS — Z3A38 38 weeks gestation of pregnancy: Secondary | ICD-10-CM

## 2017-08-24 NOTE — MAU Provider Note (Signed)
Chief Complaint:  Back Pain   First Provider Initiated Contact with Patient 08/24/17 0028      HPI: Sonya Frazier is a 22 y.o. G2P0010 at 4738w4dwho presents to maternity admissions reporting back pain. Patient was seen by RN for labor evaluation. Called to room by RN to assess pt complaints of SOB. She report shortness of breath for the past week. It is intermittent and resolved with positioning. She states that when she feels SOB she sits up or goes to sleep and she feels better. She denies chest pain or cough.  She reports good fetal movement, denies LOF, vaginal bleeding, vaginal itching/burning, urinary symptoms, h/a, dizziness, n/v, or fever/chills.    Past Medical History: Past Medical History:  Diagnosis Date  . Medical history non-contributory     Past obstetric history: OB History  Gravida Para Term Preterm AB Living  2 0 0 0 1 0  SAB TAB Ectopic Multiple Live Births  1 0 0 0      # Outcome Date GA Lbr Len/2nd Weight Sex Delivery Anes PTL Lv  2 Current           1 SAB  2828w0d             Past Surgical History: Past Surgical History:  Procedure Laterality Date  . WISDOM TOOTH EXTRACTION      Family History: Family History  Problem Relation Age of Onset  . Asthma Mother   . Cancer Maternal Aunt     Social History: Social History   Tobacco Use  . Smoking status: Former Games developermoker  . Smokeless tobacco: Never Used  Substance Use Topics  . Alcohol use: No    Comment: occ  . Drug use: No    Comment: Quit approx 1 year ago    Allergies: No Known Allergies  Meds:  Medications Prior to Admission  Medication Sig Dispense Refill Last Dose  . Cyanocobalamin (B-12) 1000 MCG SUBL Place 1,000 mcg under the tongue daily. 30 each 3 08/23/2017 at Unknown time  . iron polysaccharides (NIFEREX) 150 MG capsule Take 1 capsule (150 mg total) by mouth 2 (two) times daily. 60 capsule 1 08/23/2017 at Unknown time  . potassium chloride SA (K-DUR,KLOR-CON) 20 MEQ tablet Take 1  tablet (20 mEq total) by mouth daily. 7 tablet 0 08/23/2017 at Unknown time  . Prenat-FeCbn-FeAspGl-FA-Omega (OB COMPLETE PETITE) 35-5-1-200 MG CAPS Take 1 tablet by mouth daily. 30 capsule 12 08/23/2017 at Unknown time  . terconazole (TERAZOL 7) 0.4 % vaginal cream Place 1 applicator vaginally at bedtime. Use for 7 days. 45 g 0 08/23/2017 at Unknown time  . Vitamin D, Ergocalciferol, (DRISDOL) 50000 units CAPS capsule Take 1 capsule (50,000 Units total) by mouth every 7 (seven) days. 30 capsule 2 08/23/2017 at Unknown time  . metroNIDAZOLE (FLAGYL) 500 MG tablet Take 1 tablet (500 mg total) by mouth 2 (two) times daily. 14 tablet 0 More than a month at Unknown time  . ondansetron (ZOFRAN ODT) 8 MG disintegrating tablet Take 1 tablet (8 mg total) by mouth 2 (two) times daily. Will cause constipation.  Take a stool softener. 12 tablet 0 More than a month at Unknown time    ROS:  Review of Systems  Constitutional: Negative.   Respiratory: Positive for shortness of breath.   Cardiovascular: Negative.   Gastrointestinal: Negative.   Genitourinary: Negative.   Musculoskeletal: Positive for back pain.  Neurological: Negative.   Psychiatric/Behavioral: Negative.    I have reviewed patient's Past Medical  Hx, Surgical Hx, Family Hx, Social Hx, medications and allergies.   Physical Exam   Patient Vitals for the past 24 hrs:  BP Temp Temp src Pulse Resp SpO2 Height Weight  08/24/17 0057 127/82 - - (!) 103 18 - - -  08/24/17 0025 - - - - 20 100 % - -  08/24/17 0003 129/76 98.6 F (37 C) Oral 92 (!) 24 - - -  08/23/17 2345 - - - - - - 5' (1.524 m) 160 lb (72.6 kg)   Constitutional: Well-developed, well-nourished female in no acute distress.  Cardiovascular: normal rate Respiratory: normal effort GI: Abd soft, non-tender, gravid appropriate for gestational age.  MS: Extremities nontender, no edema, normal ROM Neurologic: Alert and oriented x 4.  GU: Neg CVAT.  Cervical Exam by RN: Dilation:  Fingertip Effacement (%): 70 Cervical Position: Anterior Station: -2 Presentation: Vertex Exam by:: K. Marijo File  FHT:  Baseline 140 , moderate variability, accelerations present, no decelerations Contractions: irregular/mild/60   MAU Course/MDM:  NST reviewed EKG ordered, discontinued d/t pt feeling better and denies episode of SOB. O2 sats 100% in entirety of MAU visit. Lung sounds clear bilaterally. No wheezing, rales, or bronchi heard on examination.   Pt discharge with labor precautions. Pt stable at time of discharge.   Assessment: 1. False labor after 37 completed weeks of gestation   2. Shortness of breath due to pregnancy   3. Back pain affecting pregnancy in third trimester     Plan: Discharge home Labor precautions and fetal kick counts Follow up as scheduled with prenatal appointment on Wednesday  Return to MAU as needed for emergencies or labor evaluation     Steward Drone Certified Nurse-Midwife 08/24/2017 12:29 AM

## 2017-08-25 ENCOUNTER — Encounter: Payer: Self-pay | Admitting: Certified Nurse Midwife

## 2017-08-25 ENCOUNTER — Ambulatory Visit (INDEPENDENT_AMBULATORY_CARE_PROVIDER_SITE_OTHER): Payer: Medicaid Other | Admitting: Certified Nurse Midwife

## 2017-08-25 VITALS — BP 128/86 | HR 101 | Wt 159.0 lb

## 2017-08-25 DIAGNOSIS — Z3480 Encounter for supervision of other normal pregnancy, unspecified trimester: Secondary | ICD-10-CM

## 2017-08-25 DIAGNOSIS — B951 Streptococcus, group B, as the cause of diseases classified elsewhere: Secondary | ICD-10-CM

## 2017-08-25 DIAGNOSIS — O479 False labor, unspecified: Secondary | ICD-10-CM

## 2017-08-25 DIAGNOSIS — Z3483 Encounter for supervision of other normal pregnancy, third trimester: Secondary | ICD-10-CM

## 2017-08-25 DIAGNOSIS — O99013 Anemia complicating pregnancy, third trimester: Secondary | ICD-10-CM

## 2017-08-25 DIAGNOSIS — D509 Iron deficiency anemia, unspecified: Secondary | ICD-10-CM

## 2017-08-25 DIAGNOSIS — O471 False labor at or after 37 completed weeks of gestation: Secondary | ICD-10-CM

## 2017-08-25 DIAGNOSIS — R7989 Other specified abnormal findings of blood chemistry: Secondary | ICD-10-CM

## 2017-08-25 DIAGNOSIS — D649 Anemia, unspecified: Secondary | ICD-10-CM

## 2017-08-25 MED ORDER — OXYCODONE-ACETAMINOPHEN 5-325 MG PO TABS: 1.0000 | ORAL_TABLET | ORAL | 0 refills | Status: DC | PRN

## 2017-08-25 NOTE — Progress Notes (Signed)
Having contractions 5 mins apart x 3 days.

## 2017-08-25 NOTE — Progress Notes (Signed)
   PRENATAL VISIT NOTE  Subjective:  Sonya Frazier is a 22 y.o. G2P0010 at 7024w5d being seen today for ongoing prenatal care.  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  Patient reports no bleeding, no leaking and occasional contractions.  Contractions: Irregular. Vag. Bleeding: None.  Movement: Present. Denies leaking of fluid.   The following portions of the patient's history were reviewed and updated as appropriate: allergies, current medications, past family history, past medical history, past social history, past surgical history and problem list. Problem list updated.  Objective:   Vitals:   08/25/17 0922  BP: 128/86  Pulse: (!) 101  Weight: 159 lb (72.1 kg)    Fetal Status: Fetal Heart Rate (bpm): NST; reactive Fundal Height: 37 cm Movement: Present     General:  Alert, oriented and cooperative. Patient is in no acute distress.  Skin: Skin is warm and dry. No rash noted.   Cardiovascular: Normal heart rate noted  Respiratory: Normal respiratory effort, no problems with respiration noted  Abdomen: Soft, gravid, appropriate for gestational age.  Pain/Pressure: Present     Pelvic: Cervical exam deferred        Extremities: Normal range of motion.  Edema: Trace  Mental Status:  Normal mood and affect. Normal behavior. Normal judgment and thought content.  NST: + accels, no decels, moderate variability, Cat. 1 tracing. 1 contractions on toco, lots of irritability noted.   Assessment and Plan:  Pregnancy: G2P0010 at 3324w5d  1. Supervision of other normal pregnancy, antepartum     Was seen in MAU in 08/24/17: with braxton hicks contractions, SOB/chest pain. Normal EKG.       Reactive  NST with uterine irritability.  1 contraction in 20 minutes.   2. Positive GBS test    PCN for labor/delivery  3. Maternal iron deficiency anemia affecting pregnancy in third trimester, antepartum     Taking Niferex  4. Low vitamin D level     Taking weekly vitamin D  5. Braxton Hick's contraction     Discussed signs of labor.  Rest encouraged.   Letter out of work the rest of the week.   - oxyCODONE-acetaminophen (PERCOCET/ROXICET) 5-325 MG tablet; Take 1 tablet by mouth every 4 (four) hours as needed for severe pain.  Dispense: 10 tablet; Refill: 0  Term labor symptoms and general obstetric precautions including but not limited to vaginal bleeding, contractions, leaking of fluid and fetal movement were reviewed in detail with the patient. Please refer to After Visit Summary for other counseling recommendations.  Return in about 1 week (around 09/01/2017) for ROB.   Roe Coombsachelle A Denney, CNM

## 2017-09-01 ENCOUNTER — Encounter: Payer: Self-pay | Admitting: Certified Nurse Midwife

## 2017-09-01 ENCOUNTER — Ambulatory Visit (INDEPENDENT_AMBULATORY_CARE_PROVIDER_SITE_OTHER): Payer: Medicaid Other | Admitting: Certified Nurse Midwife

## 2017-09-01 VITALS — BP 123/76 | HR 98 | Wt 160.4 lb

## 2017-09-01 DIAGNOSIS — Z3483 Encounter for supervision of other normal pregnancy, third trimester: Secondary | ICD-10-CM

## 2017-09-01 DIAGNOSIS — Z3480 Encounter for supervision of other normal pregnancy, unspecified trimester: Secondary | ICD-10-CM

## 2017-09-01 DIAGNOSIS — R7989 Other specified abnormal findings of blood chemistry: Secondary | ICD-10-CM

## 2017-09-01 NOTE — Progress Notes (Signed)
Patient reports good fetal movement with pressure, denies contractions. 

## 2017-09-01 NOTE — Progress Notes (Signed)
   PRENATAL VISIT NOTE  Subjective:  Sonya Frazier is a 22 y.o. G2P0010 at 3621w5d being seen today for ongoing prenatal care.  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  Patient reports no complaints.  Contractions: Not present. Vag. Bleeding: None.  Movement: Present. Denies leaking of fluid.   The following portions of the patient's history were reviewed and updated as appropriate: allergies, current medications, past family history, past medical history, past social history, past surgical history and problem list. Problem list updated.  Objective:   Vitals:   09/01/17 0951  BP: 123/76  Pulse: 98  Weight: 160 lb 6.4 oz (72.8 kg)    Fetal Status: Fetal Heart Rate (bpm): 155; doppler Fundal Height: 36 cm Movement: Present  Presentation: Vertex  General:  Alert, oriented and cooperative. Patient is in no acute distress.  Skin: Skin is warm and dry. No rash noted.   Cardiovascular: Normal heart rate noted  Respiratory: Normal respiratory effort, no problems with respiration noted  Abdomen: Soft, gravid, appropriate for gestational age.  Pain/Pressure: Present     Pelvic: Cervical exam performed Dilation: Closed Effacement (%): 50 Station: -2  Extremities: Normal range of motion.  Edema: Trace  Mental Status:  Normal mood and affect. Normal behavior. Normal judgment and thought content.   Assessment and Plan:  Pregnancy: G2P0010 at 7621w5d  1. Supervision of other normal pregnancy, antepartum     Doing well.   2. Low vitamin D level     Taking weekly vitamin D  Term labor symptoms and general obstetric precautions including but not limited to vaginal bleeding, contractions, leaking of  fluid and fetal movement were reviewed in detail with the patient. Please refer to After Visit Summary for other counseling recommendations.  Return in about 1 week (around 09/08/2017) for ROB, NST.   Roe Coombsachelle A Allexus Ovens, CNM

## 2017-09-02 ENCOUNTER — Telehealth (HOSPITAL_COMMUNITY): Payer: Self-pay | Admitting: *Deleted

## 2017-09-02 ENCOUNTER — Encounter (HOSPITAL_COMMUNITY): Payer: Self-pay | Admitting: *Deleted

## 2017-09-02 NOTE — Telephone Encounter (Signed)
Preadmission screen  

## 2017-09-04 ENCOUNTER — Other Ambulatory Visit: Payer: Self-pay | Admitting: Obstetrics and Gynecology

## 2017-09-07 ENCOUNTER — Telehealth: Payer: Self-pay

## 2017-09-07 ENCOUNTER — Other Ambulatory Visit: Payer: Self-pay | Admitting: Certified Nurse Midwife

## 2017-09-07 NOTE — Telephone Encounter (Signed)
TC from pt  c/o HA (Migraine)  Nausea and vomiting  Pt has been taking tylenol every 4 hours for HA . Per pt she is drinking enough fluids and  Still no relief.noted dizziness on Sunday Pt advised to increase fluids and try bland diet pt states she has tried everything.  I let pt know I would consult w/ a provider what other comfort measures to do.   Please advise.

## 2017-09-08 ENCOUNTER — Encounter (HOSPITAL_COMMUNITY): Payer: Self-pay | Admitting: *Deleted

## 2017-09-08 ENCOUNTER — Other Ambulatory Visit: Payer: Self-pay | Admitting: Certified Nurse Midwife

## 2017-09-08 ENCOUNTER — Encounter: Payer: Self-pay | Admitting: Certified Nurse Midwife

## 2017-09-08 ENCOUNTER — Other Ambulatory Visit: Payer: Self-pay

## 2017-09-08 ENCOUNTER — Telehealth: Payer: Self-pay

## 2017-09-08 ENCOUNTER — Telehealth (HOSPITAL_COMMUNITY): Payer: Self-pay | Admitting: *Deleted

## 2017-09-08 ENCOUNTER — Inpatient Hospital Stay (HOSPITAL_COMMUNITY)
Admission: AD | Admit: 2017-09-08 | Discharge: 2017-09-13 | DRG: 788 | Disposition: A | Discharge status: Home / Self Care | Payer: Medicaid Other | Source: Ambulatory Visit | Attending: Obstetrics and Gynecology | Admitting: Obstetrics and Gynecology

## 2017-09-08 DIAGNOSIS — B951 Streptococcus, group B, as the cause of diseases classified elsewhere: Secondary | ICD-10-CM

## 2017-09-08 DIAGNOSIS — O480 Post-term pregnancy: Secondary | ICD-10-CM | POA: Diagnosis present

## 2017-09-08 DIAGNOSIS — O1413 Severe pre-eclampsia, third trimester: Secondary | ICD-10-CM

## 2017-09-08 DIAGNOSIS — O1414 Severe pre-eclampsia complicating childbirth: Secondary | ICD-10-CM | POA: Diagnosis present

## 2017-09-08 DIAGNOSIS — Z87891 Personal history of nicotine dependence: Secondary | ICD-10-CM | POA: Diagnosis not present

## 2017-09-08 DIAGNOSIS — O9902 Anemia complicating childbirth: Secondary | ICD-10-CM | POA: Diagnosis present

## 2017-09-08 DIAGNOSIS — E669 Obesity, unspecified: Secondary | ICD-10-CM | POA: Diagnosis present

## 2017-09-08 DIAGNOSIS — Z3480 Encounter for supervision of other normal pregnancy, unspecified trimester: Secondary | ICD-10-CM

## 2017-09-08 DIAGNOSIS — Z3A40 40 weeks gestation of pregnancy: Secondary | ICD-10-CM | POA: Diagnosis not present

## 2017-09-08 DIAGNOSIS — O99214 Obesity complicating childbirth: Secondary | ICD-10-CM | POA: Diagnosis present

## 2017-09-08 DIAGNOSIS — D509 Iron deficiency anemia, unspecified: Secondary | ICD-10-CM | POA: Diagnosis present

## 2017-09-08 DIAGNOSIS — O99824 Streptococcus B carrier state complicating childbirth: Secondary | ICD-10-CM | POA: Diagnosis present

## 2017-09-08 DIAGNOSIS — Z98891 History of uterine scar from previous surgery: Secondary | ICD-10-CM

## 2017-09-08 DIAGNOSIS — Z3A41 41 weeks gestation of pregnancy: Secondary | ICD-10-CM | POA: Diagnosis not present

## 2017-09-08 LAB — COMPREHENSIVE METABOLIC PANEL
ALBUMIN: 2.8 g/dL — AB (ref 3.5–5.0)
ALK PHOS: 178 U/L — AB (ref 38–126)
ALT: 10 U/L — AB (ref 14–54)
AST: 20 U/L (ref 15–41)
Anion gap: 9 (ref 5–15)
BUN: 6 mg/dL (ref 6–20)
CALCIUM: 8.5 mg/dL — AB (ref 8.9–10.3)
CO2: 19 mmol/L — AB (ref 22–32)
CREATININE: 0.61 mg/dL (ref 0.44–1.00)
Chloride: 108 mmol/L (ref 101–111)
GFR calc Af Amer: >60 mL/min (ref >60)
GFR calc non Af Amer: >60 mL/min (ref >60)
GLUCOSE: 90 mg/dL (ref 65–99)
Potassium: 2.9 mmol/L — ABNORMAL LOW (ref 3.5–5.1)
SODIUM: 136 mmol/L (ref 135–145)
Total Bilirubin: 0.6 mg/dL (ref 0.3–1.2)
Total Protein: 7 g/dL (ref 6.5–8.1)

## 2017-09-08 LAB — APTT: aPTT: 28 seconds (ref 24–36)

## 2017-09-08 LAB — URINALYSIS, ROUTINE W REFLEX MICROSCOPIC
Bilirubin Urine: NEGATIVE
Glucose, UA: NEGATIVE mg/dL
Hgb urine dipstick: NEGATIVE
KETONES UR: NEGATIVE mg/dL
Nitrite: NEGATIVE
PH: 7 (ref 5.0–8.0)
PROTEIN: NEGATIVE mg/dL
Specific Gravity, Urine: 1.01 (ref 1.005–1.030)

## 2017-09-08 LAB — CBC
HCT: 24.6 % — ABNORMAL LOW (ref 36.0–46.0)
HEMOGLOBIN: 8 g/dL — AB (ref 12.0–15.0)
MCH: 25.9 pg — AB (ref 26.0–34.0)
MCHC: 32.5 g/dL (ref 30.0–36.0)
MCV: 79.6 fL (ref 78.0–100.0)
Platelets: 275 10*3/uL (ref 150–400)
RBC: 3.09 MIL/uL — AB (ref 3.87–5.11)
RDW: 15 % (ref 11.5–15.5)
WBC: 14 10*3/uL — AB (ref 4.0–10.5)

## 2017-09-08 LAB — FIBRINOGEN: Fibrinogen: 439 mg/dL (ref 210–475)

## 2017-09-08 LAB — PROTEIN / CREATININE RATIO, URINE
Creatinine, Urine: 84 mg/dL
Protein Creatinine Ratio: 0.36 mg/mg{Cre} — ABNORMAL HIGH (ref 0.00–0.15)
Total Protein, Urine: 30 mg/dL

## 2017-09-08 LAB — PROTIME-INR
INR: 0.98
PROTHROMBIN TIME: 12.9 s (ref 11.4–15.2)

## 2017-09-08 MED ORDER — PENICILLIN G POTASSIUM 5000000 UNITS IJ SOLR
5.0000 10*6.[IU] | Freq: Once | INTRAVENOUS | Status: AC
  Administered 2017-09-08: 5 10*6.[IU] via INTRAVENOUS
  Filled 2017-09-08: qty 5

## 2017-09-08 MED ORDER — ACETAMINOPHEN 325 MG PO TABS: 650.0000 mg | ORAL_TABLET | ORAL | Status: DC | PRN

## 2017-09-08 MED ORDER — OXYCODONE-ACETAMINOPHEN 5-325 MG PO TABS: 1.0000 | ORAL_TABLET | ORAL | Status: DC | PRN

## 2017-09-08 MED ORDER — BUTALBITAL-APAP-CAFFEINE 50-325-40 MG PO TABS
2.0000 | ORAL_TABLET | Freq: Four times a day (QID) | ORAL | Status: DC | PRN
  Administered 2017-09-08: 2 via ORAL
  Filled 2017-09-08: qty 2

## 2017-09-08 MED ORDER — MISOPROSTOL 50MCG HALF TABLET
50.0000 ug | ORAL_TABLET | ORAL | Status: DC | PRN
  Administered 2017-09-08 – 2017-09-09 (×5): 50 ug via BUCCAL
  Filled 2017-09-08 (×6): qty 1

## 2017-09-08 MED ORDER — LACTATED RINGERS IV SOLN: 500.0000 mL | INTRAVENOUS | Status: DC | PRN

## 2017-09-08 MED ORDER — TERBUTALINE SULFATE 1 MG/ML IJ SOLN: 0.2500 mg | Freq: Once | INTRAMUSCULAR | Status: DC | PRN

## 2017-09-08 MED ORDER — OXYCODONE HCL 5 MG PO TABS
10.0000 mg | ORAL_TABLET | Freq: Once | ORAL | Status: AC
  Administered 2017-09-08: 10 mg via ORAL
  Filled 2017-09-08: qty 2

## 2017-09-08 MED ORDER — LACTATED RINGERS IV SOLN
INTRAVENOUS | Status: DC
  Administered 2017-09-08 – 2017-09-10 (×4): via INTRAVENOUS

## 2017-09-08 MED ORDER — OXYTOCIN BOLUS FROM INFUSION: 500.0000 mL | Freq: Once | INTRAVENOUS | Status: DC

## 2017-09-08 MED ORDER — LABETALOL HCL 5 MG/ML IV SOLN: 20.0000 mg | INTRAVENOUS | Status: DC | PRN

## 2017-09-08 MED ORDER — FENTANYL CITRATE (PF) 100 MCG/2ML IJ SOLN
100.0000 ug | INTRAMUSCULAR | Status: DC | PRN
  Administered 2017-09-08 – 2017-09-09 (×4): 100 ug via INTRAVENOUS
  Filled 2017-09-08 (×5): qty 2

## 2017-09-08 MED ORDER — HYDRALAZINE HCL 20 MG/ML IJ SOLN: 10.0000 mg | Freq: Once | INTRAMUSCULAR | Status: DC | PRN

## 2017-09-08 MED ORDER — FLEET ENEMA 7-19 GM/118ML RE ENEM: 1.0000 | ENEMA | RECTAL | Status: DC | PRN

## 2017-09-08 MED ORDER — PENICILLIN G POT IN DEXTROSE 60000 UNIT/ML IV SOLN
3.0000 10*6.[IU] | INTRAVENOUS | Status: DC
  Administered 2017-09-08 – 2017-09-10 (×11): 3 10*6.[IU] via INTRAVENOUS
  Filled 2017-09-08 (×14): qty 50

## 2017-09-08 MED ORDER — LIDOCAINE HCL (PF) 1 % IJ SOLN: 30.0000 mL | INTRAMUSCULAR | Status: DC | PRN

## 2017-09-08 MED ORDER — OXYTOCIN 40 UNITS IN LACTATED RINGERS INFUSION - SIMPLE MED
2.5000 [IU]/h | INTRAVENOUS | Status: DC
  Filled 2017-09-08: qty 1000

## 2017-09-08 MED ORDER — ONDANSETRON 8 MG PO TBDP
8.0000 mg | ORAL_TABLET | Freq: Once | ORAL | Status: AC
  Administered 2017-09-08: 8 mg via ORAL
  Filled 2017-09-08: qty 1

## 2017-09-08 MED ORDER — OXYCODONE-ACETAMINOPHEN 5-325 MG PO TABS: 2.0000 | ORAL_TABLET | ORAL | Status: DC | PRN

## 2017-09-08 MED ORDER — ONDANSETRON HCL 4 MG/2ML IJ SOLN
4.0000 mg | Freq: Four times a day (QID) | INTRAMUSCULAR | Status: DC | PRN
  Administered 2017-09-09: 4 mg via INTRAVENOUS
  Filled 2017-09-08: qty 2

## 2017-09-08 MED ORDER — MAGNESIUM SULFATE BOLUS VIA INFUSION
4.0000 g | Freq: Once | INTRAVENOUS | Status: AC
  Administered 2017-09-08: 4 g via INTRAVENOUS
  Filled 2017-09-08: qty 500

## 2017-09-08 MED ORDER — SOD CITRATE-CITRIC ACID 500-334 MG/5ML PO SOLN
30.0000 mL | ORAL | Status: DC | PRN
  Administered 2017-09-10: 30 mL via ORAL
  Filled 2017-09-08: qty 15

## 2017-09-08 MED ORDER — MAGNESIUM SULFATE 40 G IN LACTATED RINGERS - SIMPLE
2.0000 g/h | INTRAVENOUS | Status: DC
  Administered 2017-09-09 – 2017-09-10 (×2): 2 g/h via INTRAVENOUS
  Filled 2017-09-08: qty 40
  Filled 2017-09-08: qty 500
  Filled 2017-09-08: qty 40

## 2017-09-08 NOTE — Telephone Encounter (Signed)
Please send to MAU for evaluation of her HA, may have elevated blood pressure/early labor.  Thank you Boykin Reaperachelle

## 2017-09-08 NOTE — MAU Provider Note (Signed)
History     CSN: [id number]  Arrival date and time: 09/08/17 1420   First Provider Initiated Contact with Patient 09/08/17 1502      Chief Complaint  Patient presents with  . Headache  . Nausea  . Blurred Vision   HPI  Ms.  Sonya Frazier is a 22 y.o. year old 1062P0010 female at 5252w5d weeks gestation who presents to MAU reporting nausea, blurry vision, and H/A x 3 days. She states that her H/A is unrelieved by Tylenol. She denies having any BP issues during this pregnancy. She denies VB or LOF. She reports good (+) FM. She is scheduled for IOL on Friday 09/10/2017.  Past Medical History:  Diagnosis Date  . Anemia   . Medical history non-contributory     Past Surgical History:  Procedure Laterality Date  . NO PAST SURGERIES    . WISDOM TOOTH EXTRACTION      Family History  Problem Relation Age of Onset  . Asthma Mother   . Cancer Maternal Aunt     Social History   Tobacco Use  . Smoking status: Former Games developermoker  . Smokeless tobacco: Never Used  Substance Use Topics  . Alcohol use: No    Comment: occ  . Drug use: No    Comment: Quit approx 1 year ago    Allergies: No Known Allergies  Medications Prior to Admission  Medication Sig Dispense Refill Last Dose  . Cyanocobalamin (B-12) 1000 MCG SUBL Place 1,000 mcg under the tongue daily. 30 each 3 Taking  . iron polysaccharides (NIFEREX) 150 MG capsule Take 1 capsule (150 mg total) by mouth 2 (two) times daily. 60 capsule 1 Taking  . ondansetron (ZOFRAN ODT) 8 MG disintegrating tablet Take 1 tablet (8 mg total) by mouth 2 (two) times daily. Will cause constipation.  Take a stool softener. 12 tablet 0 Taking  . oxyCODONE-acetaminophen (PERCOCET/ROXICET) 5-325 MG tablet Take 1 tablet by mouth every 4 (four) hours as needed for severe pain. 10 tablet 0 Taking  . potassium chloride SA (K-DUR,KLOR-CON) 20 MEQ tablet Take 1 tablet (20 mEq total) by mouth daily. 7 tablet 0 Taking  . Prenat-FeCbn-FeAspGl-FA-Omega (OB COMPLETE  PETITE) 35-5-1-200 MG CAPS Take 1 tablet by mouth daily. 30 capsule 12 Taking  . Vitamin D, Ergocalciferol, (DRISDOL) 50000 units CAPS capsule Take 1 capsule (50,000 Units total) by mouth every 7 (seven) days. 30 capsule 2 Taking    Review of Systems  Constitutional: Negative.   HENT: Negative.   Eyes: Negative.   Respiratory: Negative.   Cardiovascular: Negative.   Gastrointestinal: Positive for nausea.  Endocrine: Negative.   Genitourinary: Negative.   Musculoskeletal: Negative.   Skin: Negative.   Allergic/Immunologic: Negative.   Neurological: Positive for headaches.  Hematological: Negative.   Psychiatric/Behavioral: Negative.    Physical Exam   Patient Vitals for the past 24 hrs:  BP Temp Temp src Pulse Resp SpO2 Height Weight  09/08/17 1601 119/80 - - 92 - - - -  09/08/17 1553 128/85 - - 87 - - - -  09/08/17 1531 129/81 - - 93 - - - -  09/08/17 1516 131/76 - - 98 - - - -  09/08/17 1506 124/81 - - 96 - - - -  09/08/17 1431 134/74 98.9 F (37.2 C) Oral (!) 101 18 98 % 5' (1.524 m) 160 lb 4 oz (72.7 kg)    Physical Exam  Constitutional: She is oriented to person, place, and time. She appears well-developed and well-nourished.  HENT:  Head: Normocephalic and atraumatic.  Eyes: Pupils are equal, round, and reactive to light.  Neck: Normal range of motion.  Cardiovascular: Normal rate, regular rhythm and normal heart sounds.  Respiratory: Effort normal and breath sounds normal.  GI: Soft. Bowel sounds are normal.  Genitourinary:  Genitourinary Comments: Dilation: Fingertip Effacement (%): 50 Cervical Position: Anterior Station: -2 Presentation: Vertex Exam by: Carloyn Jaeger, CNM   Musculoskeletal: Normal range of motion.  Neurological: She is alert and oriented to person, place, and time. She has normal reflexes.  Skin: Skin is warm and dry.  Psychiatric: She has a normal mood and affect. Her behavior is normal. Judgment and thought content normal.    MAU Course   Procedures  MDM CCUA CBC CMP P/C Ratio Oxycodone IR 10 mg -- no relief of H/A   NST - FHR: 160 bpm / moderate variability / accels present / decels absent / TOCO: irregular UC's  Results for orders placed or performed during the hospital encounter of 09/08/17 (from the past 24 hour(s))  Urinalysis, Routine w reflex microscopic     Status: Abnormal   Collection Time: 09/08/17  2:35 PM  Result Value Ref Range   Color, Urine YELLOW YELLOW   APPearance HAZY (A) CLEAR   Specific Gravity, Urine 1.010 1.005 - 1.030   pH 7.0 5.0 - 8.0   Glucose, UA NEGATIVE NEGATIVE mg/dL   Hgb urine dipstick NEGATIVE NEGATIVE   Bilirubin Urine NEGATIVE NEGATIVE   Ketones, ur NEGATIVE NEGATIVE mg/dL   Protein, ur NEGATIVE NEGATIVE mg/dL   Nitrite NEGATIVE NEGATIVE   Leukocytes, UA LARGE (A) NEGATIVE   RBC / HPF 0-5 0 - 5 RBC/hpf   WBC, UA TOO NUMEROUS TO COUNT 0 - 5 WBC/hpf   Bacteria, UA FEW (A) NONE SEEN   Squamous Epithelial / LPF 6-30 (A) NONE SEEN   Mucus PRESENT   Protein / creatinine ratio, urine     Status: Abnormal   Collection Time: 09/08/17  2:35 PM  Result Value Ref Range   Creatinine, Urine 84.00 mg/dL   Total Protein, Urine 30 mg/dL   Protein Creatinine Ratio 0.36 (H) 0.00 - 0.15 mg/mg[Cre]  CBC     Status: Abnormal   Collection Time: 09/08/17  3:12 PM  Result Value Ref Range   WBC 14.0 (H) 4.0 - 10.5 K/uL   RBC 3.09 (L) 3.87 - 5.11 MIL/uL   Hemoglobin 8.0 (L) 12.0 - 15.0 g/dL   HCT 09.8 (L) 11.9 - 14.7 %   MCV 79.6 78.0 - 100.0 fL   MCH 25.9 (L) 26.0 - 34.0 pg   MCHC 32.5 30.0 - 36.0 g/dL   RDW 82.9 56.2 - 13.0 %   Platelets 275 150 - 400 K/uL  Comprehensive metabolic panel     Status: Abnormal   Collection Time: 09/08/17  3:12 PM  Result Value Ref Range   Sodium 136 135 - 145 mmol/L   Potassium 2.9 (L) 3.5 - 5.1 mmol/L   Chloride 108 101 - 111 mmol/L   CO2 19 (L) 22 - 32 mmol/L   Glucose, Bld 90 65 - 99 mg/dL   BUN 6 6 - 20 mg/dL   Creatinine, Ser 8.65 0.44 - 1.00  mg/dL   Calcium 8.5 (L) 8.9 - 10.3 mg/dL   Total Protein 7.0 6.5 - 8.1 g/dL   Albumin 2.8 (L) 3.5 - 5.0 g/dL   AST 20 15 - 41 U/L   ALT 10 (L) 14 - 54 U/L  Alkaline Phosphatase 178 (H) 38 - 126 U/L   Total Bilirubin 0.6 0.3 - 1.2 mg/dL   GFR calc non Af Amer >60 >60 mL/min   GFR calc Af Amer >60 >60 mL/min   Anion gap 9 5 - 15    Assessment and Plan  Admit to L&D for IOL d/t PEC with severe features Routine L&D orders  Dr. Oralia Rud notified of admission; assumes care upon admission  Raelyn Mora, MSN, CNM 09/08/2017, 3:02 PM

## 2017-09-08 NOTE — Telephone Encounter (Signed)
Called pt to inform her of flu visitation restrictions beginning. Pt states she has a headache unrelieved by tylenol for 3 days.  Called clinic yesterday and was told to come in at her appointment time at 1500 today.  After I ended the call with pt I contactedDr Alvester MorinNewton with information and called pt back to come in to hospital as soon as possible thru MAU for BP evaluation.

## 2017-09-08 NOTE — Telephone Encounter (Signed)
Spoke with pt.  Instructed her to come to MAU for evaluation with verbalized understanding.

## 2017-09-08 NOTE — Anesthesia Pain Management Evaluation Note (Signed)
  CRNA Pain Management Visit Note  Patient: Sonya Frazier, 22 y.o., female  "Hello I am a member of the anesthesia team at [facility]. We have an anesthesia team available at all times to provide care throughout the hospital, including epidural management and anesthesia for C-section. I don't know your plan for the delivery whether it a natural birth, water birth, IV sedation, nitrous supplementation, doula or epidural, but we want to meet your pain goals."   1.Was your pain managed to your expectations on prior hospitalizations?   Yes   2.What is your expectation for pain management during this hospitalization?     Epidural and IV pain meds  3.How can we help you reach that goal? IV pain Rx and epidural  Record the patient's initial score and the patient's pain goal.   Pain: 0  Pain Goal: 7 [facility] wants you to be able to say your pain was always managed very well.  Sonya Frazier 09/08/2017

## 2017-09-08 NOTE — Telephone Encounter (Signed)
error 

## 2017-09-08 NOTE — MAU Note (Addendum)
Pt presents with c/o nausea, blurred vision, and H/A x3 days.  Reports no BP issues during pregnancy.  Reports H/A unrelieved with Tylenol. Denies VB or LOF. Reports +FM

## 2017-09-08 NOTE — Progress Notes (Signed)
Sonya Frazier is a 22 y.o. G2P0010 at 3864w5d by LMP admitted for induction of labor due to pre-eclampsia with severe features.  Subjective: Patient with no questions or concerns. Reports headache and vision are both improving.   Objective: BP 125/78   Pulse 72   Temp 97.8 F (36.6 C) (Oral)   Resp 16   Ht 5' (1.524 m)   Wt 72.7 kg (160 lb 4 oz)   LMP 11/27/2016   SpO2 99%   BMI 31.30 kg/m  I/O last 3 completed shifts: In: 540.8 [P.O.:300; I.V.:240.8] Out: 425 [Urine:425] Total I/O In: 575 [P.O.:325; I.V.:250] Out: 375 [Urine:375]  FHT:  FHR: 120 bpm, variability: moderate,  accelerations:  Present,  decelerations:  Absent UC:   irregular, every 1-2 minutes SVE:   Dilation: Fingertip Effacement (%): 50 Station: -2 Exam by:: Carloyn Jaeger. Dawson, CNM  Labs: Lab Results  Component Value Date   WBC 14.0 (H) 09/08/2017   HGB 8.0 (L) 09/08/2017   HCT 24.6 (L) 09/08/2017   MCV 79.6 09/08/2017   PLT 275 09/08/2017    Assessment / Plan: IOL for preeclampsia with severe features.  Labor: s/p 1 cytotec. Will likely place FB pending next cervical check.  Preeclampsia:  on magnesium sulfate  Bleeding: PT/INR, fibrinogen, and aPTT wnl.  Fetal Wellbeing:  Category I Pain Control:  Per patient request I/D:  PCN Anticipated MOD:  NSVD  Oralia ManisSherin Lakshmi Sundeen, DO PGY-1 09/08/2017, 9:27 PM

## 2017-09-08 NOTE — H&P (Signed)
OBSTETRIC ADMISSION HISTORY AND PHYSICAL  Sonya Frazier is a 22 y.o. female G2P0010 with IUP at [redacted]w[redacted]d by LMP presenting for IOL due to pre-eclampsia w/ severe symptoms (headache and blurry vision). She presented to MAU with these symptoms for the last 3 days that have been unrelieved by Tylenol. She reports +FMs, No LOF, no VB, no peripheral edema, and no RUQ pain. Has had some nausea.  She plans on bottle feeding. She requests depo for birth control. She received her prenatal [facility] at [facility]   Dating: By LMP --->  Estimated Date of Delivery: 09/03/17  Sono:   @[redacted]w[redacted]d , CWD, left UTD A1, otherwise normal anatomy, cephalic presentation, posterior placental attachment, 2283g, 66% EFW  Prenatal History/Complications: Anemia GBS +  Past Medical History: Past Medical History:  Diagnosis Date  . Anemia   . Medical history non-contributory     Past Surgical History: Past Surgical History:  Procedure Laterality Date  . NO PAST SURGERIES    . WISDOM TOOTH EXTRACTION      Obstetrical History: OB History    Gravida Para Term Preterm AB Living   2 0 0 0 1 0   SAB TAB Ectopic Multiple Live Births   1 0 0 0        Social History: Social History   Socioeconomic History  . Marital status: Single    Spouse name: None  . Number of children: None  . Years of education: None  . Highest education level: None  Social Needs  . Financial resource strain: None  . Food insecurity - worry: None  . Food insecurity - inability: None  . Transportation needs - medical: None  . Transportation needs - non-medical: None  Occupational History  . None  Tobacco Use  . Smoking status: Former Games developer  . Smokeless tobacco: Never Used  Substance and Sexual Activity  . Alcohol use: No    Comment: occ  . Drug use: No    Comment: Quit approx 1 year ago  . Sexual activity: Yes    Birth control/protection: None  Other Topics Concern  . None  Social History Narrative  . None    Family  History: Family History  Problem Relation Age of Onset  . Asthma Mother   . Cancer Maternal Aunt     Allergies: No Known Allergies  Medications Prior to Admission  Medication Sig Dispense Refill Last Dose  . acetaminophen (TYLENOL) 500 MG tablet Take 500 mg by mouth every 6 (six) hours as needed.   09/08/2017 at Unknown time  . Prenat-FeCbn-FeAspGl-FA-Omega (OB COMPLETE PETITE) 35-5-1-200 MG CAPS Take 1 tablet by mouth daily. 30 capsule 12 09/08/2017 at Unknown time  . Cyanocobalamin (B-12) 1000 MCG SUBL Place 1,000 mcg under the tongue daily. (Patient not taking: Reported on 09/08/2017) 30 each 3 Not Taking at Unknown time  . iron polysaccharides (NIFEREX) 150 MG capsule Take 1 capsule (150 mg total) by mouth 2 (two) times daily. (Patient not taking: Reported on 09/08/2017) 60 capsule 1 Not Taking at Unknown time  . ondansetron (ZOFRAN ODT) 8 MG disintegrating tablet Take 1 tablet (8 mg total) by mouth 2 (two) times daily. Will cause constipation.  Take a stool softener. (Patient not taking: Reported on 09/08/2017) 12 tablet 0 Not Taking at Unknown time  . potassium chloride SA (K-DUR,KLOR-CON) 20 MEQ tablet Take 1 tablet (20 mEq total) by mouth daily. (Patient not taking: Reported on 09/08/2017) 7 tablet 0 Not Taking at Unknown time     Review  of Systems   All systems reviewed and negative except as stated in HPI  Blood pressure 128/89, pulse 84, temperature 98.9 F (37.2 C), temperature source Oral, resp. rate 18, height 5' (1.524 m), weight 72.7 kg (160 lb 4 oz), last menstrual period 11/27/2016, SpO2 98 %, unknown if currently breastfeeding. General appearance: alert, cooperative and appears stated age Lungs: clear to auscultation bilaterally Heart: regular rate and rhythm Abdomen: soft, non-tender; bowel sounds normal DTR's intact Presentation: cephalic Fetal monitoringBaseline: 120 bpm, Variability: Good {> 6 bpm), Accelerations: Reactive and Decelerations: Absent Uterine  activityFrequency: Every 6-10 minutes Dilation: Fingertip Effacement (%): 50 Station: -2 Exam by:: Carloyn Jaeger. Dawson, CNM   Prenatal labs: ABO, Rh: A/Positive/-- (07/10 1027) Antibody: Negative (07/10 1027) Rubella: 2.29 (07/10 1027) RPR: Non Reactive (10/31 1050)  HBsAg: Negative (07/10 1027)  HIV: Non Reactive (10/31 1050)  GBS: Positive (01/02 0928)  1 hr Glucola WNL Genetic screening  AFP neg, NIPS:[id number]:neg Anatomy US Normal  Prenatal Transfer Tool  Maternal Diabetes: No Genetic Screening: Normal Maternal Ultrasounds/Referrals: Normal Fetal Ultrasounds or other Referrals:  None Maternal Substance Abuse:  No Significant Maternal Medications:  None Significant Maternal Lab Results: Lab values include: Group B Strep positive  Results for orders placed or performed during the hospital encounter of 09/08/17 (from the past 24 hour(s))  Urinalysis, Routine w reflex microscopic   Collection Time: 09/08/17  2:35 PM  Result Value Ref Range   Color, Urine YELLOW YELLOW   APPearance HAZY (A) CLEAR   Specific Gravity, Urine 1.010 1.005 - 1.030   pH 7.0 5.0 - 8.0   Glucose, UA NEGATIVE NEGATIVE mg/dL   Hgb urine dipstick NEGATIVE NEGATIVE   Bilirubin Urine NEGATIVE NEGATIVE   Ketones, ur NEGATIVE NEGATIVE mg/dL   Protein, ur NEGATIVE NEGATIVE mg/dL   Nitrite NEGATIVE NEGATIVE   Leukocytes, UA LARGE (A) NEGATIVE   RBC / HPF 0-5 0 - 5 RBC/hpf   WBC, UA TOO NUMEROUS TO COUNT 0 - 5 WBC/hpf   Bacteria, UA FEW (A) NONE SEEN   Squamous Epithelial / LPF 6-30 (A) NONE SEEN   Mucus PRESENT   Protein / creatinine ratio, urine   Collection Time: 09/08/17  2:35 PM  Result Value Ref Range   Creatinine, Urine 84.00 mg/dL   Total Protein, Urine 30 mg/dL   Protein Creatinine Ratio 0.36 (H) 0.00 - 0.15 mg/mg[Cre]  CBC   Collection Time: 09/08/17  3:12 PM  Result Value Ref Range   WBC 14.0 (H) 4.0 - 10.5 K/uL   RBC 3.09 (L) 3.87 - 5.11 MIL/uL   Hemoglobin 8.0 (L) 12.0 - 15.0 g/dL   HCT 16.124.6  (L) 09.636.0 - 46.0 %   MCV 79.6 78.0 - 100.0 fL   MCH 25.9 (L) 26.0 - 34.0 pg   MCHC 32.5 30.0 - 36.0 g/dL   RDW 04.515.0 40.911.5 - 81.115.5 %   Platelets 275 150 - 400 K/uL  Comprehensive metabolic panel   Collection Time: 09/08/17  3:12 PM  Result Value Ref Range   Sodium 136 135 - 145 mmol/L   Potassium 2.9 (L) 3.5 - 5.1 mmol/L   Chloride 108 101 - 111 mmol/L   CO2 19 (L) 22 - 32 mmol/L   Glucose, Bld 90 65 - 99 mg/dL   BUN 6 6 - 20 mg/dL   Creatinine, Ser 9.140.61 0.44 - 1.00 mg/dL   Calcium 8.5 (L) 8.9 - 10.3 mg/dL   Total Protein 7.0 6.5 - 8.1 g/dL   Albumin 2.8 (L) 3.5 -  5.0 g/dL   AST 20 15 - 41 U/L   ALT 10 (L) 14 - 54 U/L   Alkaline Phosphatase 178 (H) 38 - 126 U/L   Total Bilirubin 0.6 0.3 - 1.2 mg/dL   GFR calc non Af Amer >60 >60 mL/min   GFR calc Af Amer >60 >60 mL/min   Anion gap 9 5 - 15    Patient Active Problem List   Diagnosis Date Noted  . Severe preeclampsia, third trimester 09/08/2017  . Post term pregnancy 09/08/2017  . Positive GBS test 08/09/2017  . Maternal iron deficiency anemia affecting pregnancy in third trimester, antepartum 06/04/2017  . 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 Low vitamin D level 02/11/2017  . Supervision of other normal pregnancy, antepartum 02/09/2017    Assessment/Plan:  VONDRA ALDREDGE is a 22 y.o. G2P0010 at [redacted]w[redacted]d here for induction of labor given pre-eclampsia with severe features (headache, blurred vision).  #Labor: IOL for preeclampsia. Will start with Cytotec. Place FB when able.  #Preeclampsia: P/C ratio elevated at .36; other labs wnl. Also with severe features. Will start Magnesium. Pain medication given for headache. Monitor BPs; currently normal.  #Pain: Per patient request #FWB: Category 1 #ID: GBS positive, abx started #MOF: bottle #MOC:depo #Circ:  Yes, outpatient  Claudine Mouton, Student-PA  09/08/2017, 5:05 PM  OB FELLOW HISTORY AND PHYSICAL ATTESTATION  I confirm that I have verified the  information documented in the physician assistant student's note and that I have also personally reperformed the physical exam and all medical decision making activities. I agree with above documentation and have made edits as needed.   Caryl Ada, DO OB Fellow 09/09/2017, 2:27 PM

## 2017-09-09 ENCOUNTER — Inpatient Hospital Stay (HOSPITAL_COMMUNITY): Payer: Medicaid Other | Admitting: Anesthesiology

## 2017-09-09 LAB — CBC
HEMATOCRIT: 27.3 % — AB (ref 36.0–46.0)
HEMATOCRIT: 27.3 % — AB (ref 36.0–46.0)
HEMOGLOBIN: 8.7 g/dL — AB (ref 12.0–15.0)
Hemoglobin: 8.7 g/dL — ABNORMAL LOW (ref 12.0–15.0)
MCH: 25.4 pg — AB (ref 26.0–34.0)
MCH: 25.3 pg — ABNORMAL LOW (ref 26.0–34.0)
MCHC: 31.9 g/dL (ref 30.0–36.0)
MCHC: 31.9 g/dL (ref 30.0–36.0)
MCV: 79.6 fL (ref 78.0–100.0)
MCV: 79.4 fL (ref 78.0–100.0)
PLATELETS: 291 10*3/uL (ref 150–400)
Platelets: 296 10*3/uL (ref 150–400)
RBC: 3.43 MIL/uL — AB (ref 3.87–5.11)
RBC: 3.44 MIL/uL — ABNORMAL LOW (ref 3.87–5.11)
RDW: 15 % (ref 11.5–15.5)
RDW: 14.8 % (ref 11.5–15.5)
WBC: 15.6 10*3/uL — ABNORMAL HIGH (ref 4.0–10.5)
WBC: 16.6 10*3/uL — AB (ref 4.0–10.5)

## 2017-09-09 LAB — RPR: RPR: NONREACTIVE

## 2017-09-09 LAB — MAGNESIUM: Magnesium: 6.2 mg/dL (ref 1.7–2.4)

## 2017-09-09 MED ORDER — PHENYLEPHRINE 40 MCG/ML (10ML) SYRINGE FOR IV PUSH (FOR BLOOD PRESSURE SUPPORT)
80.0000 ug | PREFILLED_SYRINGE | INTRAVENOUS | Status: DC | PRN
  Filled 2017-09-09: qty 10

## 2017-09-09 MED ORDER — DIPHENHYDRAMINE HCL 50 MG/ML IJ SOLN: 12.5000 mg | INTRAMUSCULAR | Status: DC | PRN

## 2017-09-09 MED ORDER — LACTATED RINGERS IV SOLN: 500.0000 mL | Freq: Once | INTRAVENOUS | Status: DC

## 2017-09-09 MED ORDER — FENTANYL 2.5 MCG/ML BUPIVACAINE 1/10 % EPIDURAL INFUSION (WH - ANES)
14.0000 mL/h | INTRAMUSCULAR | Status: DC | PRN
  Administered 2017-09-09: 12 mL/h via EPIDURAL
  Administered 2017-09-10 (×2): 14 mL/h via EPIDURAL
  Filled 2017-09-09 (×3): qty 100

## 2017-09-09 MED ORDER — TERBUTALINE SULFATE 1 MG/ML IJ SOLN: 0.2500 mg | Freq: Once | INTRAMUSCULAR | Status: DC | PRN

## 2017-09-09 MED ORDER — OXYTOCIN 40 UNITS IN LACTATED RINGERS INFUSION - SIMPLE MED
1.0000 m[IU]/min | INTRAVENOUS | Status: DC
  Administered 2017-09-09: 2 m[IU]/min via INTRAVENOUS
  Administered 2017-09-10: 4 m[IU]/min via INTRAVENOUS
  Administered 2017-09-10: 12 m[IU]/min via INTRAVENOUS
  Administered 2017-09-10: 2 m[IU]/min via INTRAVENOUS
  Administered 2017-09-10: 10 m[IU]/min via INTRAVENOUS

## 2017-09-09 MED ORDER — OXYTOCIN 40 UNITS IN LACTATED RINGERS INFUSION - SIMPLE MED: 1.0000 m[IU]/min | INTRAVENOUS | Status: DC

## 2017-09-09 MED ORDER — ZOLPIDEM TARTRATE 5 MG PO TABS
5.0000 mg | ORAL_TABLET | Freq: Every evening | ORAL | Status: DC | PRN
  Administered 2017-09-09: 5 mg via ORAL
  Filled 2017-09-09: qty 1

## 2017-09-09 MED ORDER — EPHEDRINE 5 MG/ML INJ: 10.0000 mg | INTRAVENOUS | Status: DC | PRN

## 2017-09-09 MED ORDER — LIDOCAINE HCL (PF) 1 % IJ SOLN
INTRAMUSCULAR | Status: DC | PRN
  Administered 2017-09-09: 3 mL via EPIDURAL
  Administered 2017-09-09: 5 mL via EPIDURAL

## 2017-09-09 MED ORDER — LACTATED RINGERS IV SOLN
500.0000 mL | Freq: Once | INTRAVENOUS | Status: AC
  Administered 2017-09-09: 500 mL via INTRAVENOUS

## 2017-09-09 MED ORDER — PHENYLEPHRINE 40 MCG/ML (10ML) SYRINGE FOR IV PUSH (FOR BLOOD PRESSURE SUPPORT)
80.0000 ug | PREFILLED_SYRINGE | INTRAVENOUS | Status: DC | PRN
  Administered 2017-09-10 (×2): 80 ug via INTRAVENOUS

## 2017-09-09 NOTE — Progress Notes (Signed)
Sonya Frazier is a 22 y.o. G2P0010 at 1972w5d by LMP admitted for induction of labor due to pre-eclampsia with severe features.  Subjective: Patient resting comfortably.   Objective: BP (!) 112/59   Pulse 77   Temp 97.7 F (36.5 C) (Oral)   Resp 16   Ht 5' (1.524 m)   Wt 72.7 kg (160 lb 4 oz)   LMP 11/27/2016   SpO2 99%   BMI 31.30 kg/m  I/O last 3 completed shifts: In: 540.8 [P.O.:300; I.V.:240.8] Out: 425 [Urine:425] Total I/O In: 2085 [P.O.:1285; I.V.:750; IV Piggyback:50] Out: 1575 [Urine:1575]  FHT:  FHR: 140 bpm, variability: moderate,  accelerations:  Present,  decelerations:  Present early UC:   Irregular  SVE:   Dilation: Fingertip Effacement (%): 50 Station: -2 Exam by:: Darin EngelsAbraham, MD  Labs: Lab Results  Component Value Date   WBC 14.0 (H) 09/08/2017   HGB 8.0 (L) 09/08/2017   HCT 24.6 (L) 09/08/2017   MCV 79.6 09/08/2017   PLT 275 09/08/2017    Assessment / Plan: IOL for preeclampsia with severe features.  Labor: s/p 1 cytotec. Pending next cervical check cytotec vs. Pitocin Preeclampsia:  on magnesium sulfate  Bleeding: PT/INR, fibrinogen, and aPTT wnl.  Fetal Wellbeing:  Category I Pain Control:  Per patient request I/D:  PCN Anticipated MOD:  NSVD  Sonya ManisSherin Aarthi Uyeno, DO PGY-1 09/09/2017, 1:43 AM

## 2017-09-09 NOTE — Anesthesia Preprocedure Evaluation (Signed)
Anesthesia Evaluation  Patient identified by MRN, date of birth, ID band Patient awake    Reviewed: Allergy & Precautions, NPO status , Patient's Chart, lab work & pertinent test results  Airway Mallampati: II  TM Distance: >3 FB Neck ROM: Full    Dental  (+) Teeth Intact, Dental Advisory Given   Pulmonary former smoker,    Pulmonary exam normal breath sounds clear to auscultation       Cardiovascular hypertension, Normal cardiovascular exam Rhythm:Regular Rate:Normal     Neuro/Psych negative neurological ROS     GI/Hepatic negative GI ROS, Neg liver ROS,   Endo/Other  Obesity   Renal/GU negative Renal ROS     Musculoskeletal negative musculoskeletal ROS (+)   Abdominal   Peds  Hematology  (+) Blood dyscrasia, anemia , Plt 291k   Anesthesia Other Findings Day of surgery medications reviewed with the patient.  Reproductive/Obstetrics (+) Pregnancy Pre-Eclampsia, magnesium gtt +HA, blurred vision                             Anesthesia Physical Anesthesia Plan  ASA: III  Anesthesia Plan: Epidural   Post-op Pain Management:    Induction:   PONV Risk Score and Plan: 2 and Treatment may vary due to age or medical condition  Airway Management Planned:   Additional Equipment:   Intra-op Plan:   Post-operative Plan:   Informed Consent: I have reviewed the patients History and Physical, chart, labs and discussed the procedure including the risks, benefits and alternatives for the proposed anesthesia with the patient or authorized representative who has indicated his/her understanding and acceptance.   Dental advisory given  Plan Discussed with:   Anesthesia Plan Comments: (Patient identified. Risks/Benefits/Options discussed with patient including but not limited to bleeding, infection, nerve damage, paralysis, failed block, incomplete pain control, headache, blood pressure  changes, nausea, vomiting, reactions to medication both or allergic, itching and postpartum back pain. Confirmed with bedside nurse the patient's most recent platelet count. Confirmed with patient that they are not currently taking any anticoagulation, have any bleeding history or any family history of bleeding disorders. Patient expressed understanding and wished to proceed. All questions were answered. )        Anesthesia Quick Evaluation

## 2017-09-09 NOTE — Progress Notes (Signed)
Sonya Frazier is a 22 y.o. G2P0010 at 6018w5d by LMP admitted for induction of labor due to pre-eclampsia with severe features.  Subjective: Patient with no questions at this time. States she can feel contractions more strongly. Feels very weak. States she now has urine foley catheter because she fell on the way to bathroom during the day.   Objective: BP 125/80   Pulse 91   Temp 98.4 F (36.9 C) (Axillary)   Resp 16   Ht 5' (1.524 m)   Wt 72.7 kg (160 lb 4 oz)   LMP 11/27/2016   SpO2 98%   BMI 31.30 kg/m  I/O last 3 completed shifts: In: 6447.9 [P.O.:2785; I.V.:3362.9; IV Piggyback:300] Out: 6275 [Urine:6275] Total I/O In: 245 [P.O.:120; I.V.:125] Out: 75 [Urine:75]  FHT:  FHR: 115 bpm, variability: moderate,  accelerations:  Present,  decelerations:  Absent UC:   irregular, every 2-4 minutes SVE:   Dilation: 1.5 Effacement (%): Thick Station: -3 Exam by:: Dr Doroteo GlassmanPhelps  Labs: Lab Results  Component Value Date   WBC 15.6 (H) 09/09/2017   HGB 8.7 (L) 09/09/2017   HCT 27.3 (L) 09/09/2017   MCV 79.6 09/09/2017   PLT 296 09/09/2017    Assessment / Plan: IOL for preeclampsia with severe features  Labor: FB in place. Will continue cytotec Preeclampsia:  on magnesium sulfate and BP normal, UOP is adequate, Mg level 6.2 @1147 . Will monitor for signs of CNS changes. If neuro changes noted will plan to re-check Mg levels  Fetal Wellbeing:  Category I Pain Control:  IV pain meds I/D:  PCN Anticipated MOD:  NSVD  Sonya ManisSherin Karrin Eisenmenger, DO PGY-1 09/09/2017, 8:44 PM

## 2017-09-09 NOTE — Progress Notes (Signed)
Sonya Frazier is a 22 y.o. G2P0010 at 4338w5d by LMP admitted for induction of labor due to pre-eclampsia with severe features.   Subjective: Patient reporting lightheadedness and weakness. Starting to feel contractions that are painful.  Objective: BP 116/79   Pulse (!) 110   Temp 98.5 F (36.9 C) (Oral)   Resp 16   Ht 5' (1.524 m)   Wt 160 lb 4 oz (72.7 kg)   LMP 11/27/2016   SpO2 98%   BMI 31.30 kg/m  I/O last 3 completed shifts: In: 4297.5 [P.O.:2305; I.V.:1842.5; IV Piggyback:150] Out: 3500 [Urine:3500] Total I/O In: 1060.4 [P.O.:240; I.V.:770.4; IV Piggyback:50] Out: 2025 [Urine:2025]  FHT:  FHR: 110 bpm, variability: moderate,  accelerations:  Present,  decelerations:  Absent UC:   Regualr, every 3-6 min   SVE:   Dilation: 1 Effacement (%): Thick Station: -3 Exam by:: Dr.Tiburcio Linder  SROM ~0600  Labs: Lab Results  Component Value Date   WBC 15.6 (H) 09/09/2017   HGB 8.7 (L) 09/09/2017   HCT 27.3 (L) 09/09/2017   MCV 79.6 09/09/2017   PLT 296 09/09/2017    Assessment / Plan: IOL for preeclampsia with severe features.  Not in labor  Labor: Continue cytotec. FB in place. Will recheck status of FB in 4 hours.  Preeclampsia:  on magnesium sulfate. BPs normal. Magnesium level therapeutic.  Fetal Wellbeing:  Category I Pain Control:  IV pain meds I/D:  PCN Anticipated MOD:  NSVD  Sonya AdaJazma Kaston Faughn, DO 09/09/2017, 1:49 PM

## 2017-09-09 NOTE — Anesthesia Procedure Notes (Signed)
Epidural Patient location during procedure: OB Start time: 09/09/2017 11:13 PM End time: 09/09/2017 11:18 PM  Staffing Anesthesiologist: Cecile Hearingurk, Paiton Fosco Edward, MD Performed: anesthesiologist   Preanesthetic Checklist Completed: patient identified, pre-op evaluation, timeout performed, IV checked, risks and benefits discussed and monitors and equipment checked  Epidural Patient position: sitting Prep: DuraPrep Patient monitoring: blood pressure and continuous pulse ox Approach: midline Location: L3-L4 Injection technique: LOR air  Needle:  Needle type: Tuohy  Needle gauge: 17 G Needle length: 9 cm Needle insertion depth: 5 cm Catheter size: 19 Gauge Catheter at skin depth: 10 cm Test dose: negative and Other (1% Lidocaine)  Additional Notes Patient identified.  Risk benefits discussed including failed block, incomplete pain control, headache, nerve damage, paralysis, blood pressure changes, nausea, vomiting, reactions to medication both toxic or allergic, and postpartum back pain.  Patient expressed understanding and wished to proceed.  All questions were answered.  Sterile technique used throughout procedure and epidural site dressed with sterile barrier dressing. No paresthesia or other complications noted. The patient did not experience any signs of intravascular injection such as tinnitus or metallic taste in mouth nor signs of intrathecal spread such as rapid motor block. Please see nursing notes for vital signs. Reason for block:procedure for pain

## 2017-09-09 NOTE — Progress Notes (Signed)
Sonya Frazier is a 22 y.o. G2P0010 at 9515w5d by LMP admitted for induction of labor due to pre-eclampsia with severe features.  Subjective: Patient reporting dizziness and vision changes. States she felt weak when walking to bathroom.   Objective: BP 136/85   Pulse 80   Temp (!) 97.4 F (36.3 C) (Axillary) Comment: Extra blanket given to patient  Resp 16   Ht 5' (1.524 m)   Wt 72.7 kg (160 lb 4 oz)   LMP 11/27/2016   SpO2 99%   BMI 31.30 kg/m  I/O last 3 completed shifts: In: 540.8 [P.O.:300; I.V.:240.8] Out: 425 [Urine:425] Total I/O In: 2506.7 [P.O.:1405; I.V.:1001.7; IV Piggyback:100] Out: 2075 [Urine:2075]  FHT:  FHR: 155 bpm, variability: moderate,  accelerations:  Present,  decelerations:  Absent UC:   Irregular, every 2-3 min   SVE:   Dilation: Fingertip Effacement (%): 50 Station: -2 Exam by:: Doretha Imus. Summers, RN  Labs: Lab Results  Component Value Date   WBC 14.0 (H) 09/08/2017   HGB 8.0 (L) 09/08/2017   HCT 24.6 (L) 09/08/2017   MCV 79.6 09/08/2017   PLT 275 09/08/2017    Assessment / Plan: IOL for preeclampsia with severe features.  Labor: s/p 2 cytotec. Will continue cytotec Preeclampsia:  on magnesium sulfate. Philipp DeputyKim Shaw, CNM made aware of symptoms  Bleeding: PT/INR, fibrinogen, and aPTT wnl.  Fetal Wellbeing:  Category I Pain Control:  Per patient request I/D:  PCN Anticipated MOD:  NSVD  Oralia ManisSherin Tekoa Amon, DO PGY-1 09/09/2017, 3:39 AM

## 2017-09-09 NOTE — Progress Notes (Signed)
Will attempt Foley bulb insertion after 10am.

## 2017-09-09 NOTE — Progress Notes (Signed)
Sonya Frazier is a 22 y.o. G2P0010 at 2759w5d by LMP admitted for induction of labor due to pre-eclampsia with severe features.   Subjective: Patient still feeling weak. Denies PIH symptoms. Contraction pain.   Objective: BP 127/81   Pulse 88   Temp 98.2 F (36.8 C) (Oral)   Resp 16   Ht 5' (1.524 m)   Wt 160 lb 4 oz (72.7 kg)   LMP 11/27/2016   SpO2 98%   BMI 31.30 kg/m  I/O last 3 completed shifts: In: 4297.5 [P.O.:2305; I.V.:1842.5; IV Piggyback:150] Out: 3500 [Urine:3500] Total I/O In: 1730.4 [P.O.:360; I.V.:1270.4; IV Piggyback:100] Out: 2500 [Urine:2500]  FHT:  FHR: 110 bpm, variability: moderate,  accelerations:  Present,  decelerations:  Absent UC:   Irregular every 4-8 min  SVE:   Dilation: 1.5 Effacement (%): Thick Station: -3 Exam by:: Dr Sonya Frazier  SROM ~0600  Labs: Lab Results  Component Value Date   WBC 15.6 (H) 09/09/2017   HGB 8.7 (L) 09/09/2017   HCT 27.3 (L) 09/09/2017   MCV 79.6 09/09/2017   PLT 296 09/09/2017    Assessment / Plan: IOL for preeclampsia with severe features.  Not in labor  Labor: Continue cytotec (noted that nurse has not been giving her cytotec since FB placed; asked for her to continue cytotec). FB in place. Preeclampsia:  on magnesium sulfate. BPs normal. Magnesium level therapeutic.  Fetal Wellbeing:  Category I Pain Control:  IV pain meds I/D:  PCN Anticipated MOD:  NSVD  Sonya AdaJazma Jaelynne Hockley, DO 09/09/2017, 5:34 PM

## 2017-09-09 NOTE — Progress Notes (Signed)
Lab at bedside for blood draw.

## 2017-09-09 NOTE — Progress Notes (Signed)
Sonya Frazier is a 22 y.o. G2P0010 at 5115w5d by LMP admitted for induction of labor due to pre-eclampsia with severe features.  Subjective: Patient doing well. States she would like an epidural soon. Per nursing staff FB has fallen out.   Objective: BP 124/84   Pulse 90   Temp 98.4 F (36.9 C) (Axillary)   Resp 16   Ht 5' (1.524 m)   Wt 72.7 kg (160 lb 4 oz)   LMP 11/27/2016   SpO2 98%   BMI 31.30 kg/m  I/O last 3 completed shifts: In: 6447.9 [P.O.:2785; I.V.:3362.9; IV Piggyback:300] Out: 6275 [Urine:6275] Total I/O In: 965 [P.O.:540; I.V.:375; IV Piggyback:50] Out: 350 [Urine:350]  FHT:  FHR: 120 bpm, variability: moderate,  accelerations:  Present,  decelerations:  Absent UC:   Irregular  SVE:   Dilation: 4 Effacement (%): 70 Station: -2 Exam by:: Sonya Kung Stewart, RN  Labs: Lab Results  Component Value Date   WBC 15.6 (H) 09/09/2017   HGB 8.7 (L) 09/09/2017   HCT 27.3 (L) 09/09/2017   MCV 79.6 09/09/2017   PLT 296 09/09/2017    Assessment / Plan: IOL for preeclampsia with severe features  Labor: FB out. Will start Pitocin and titrate up per protocol  Preeclampsia:  on magnesium sulfate and BP normal, UOP is adequate, Mg level 6.2 @1147 . Will monitor for signs of CNS changes. If neuro changes noted will plan to re-check Mg levels  Fetal Wellbeing:  Category I Pain Control:  Epidural planned  I/D:  PCN Anticipated MOD:  NSVD  Sonya ManisSherin Levie Owensby, DO PGY-1 09/09/2017, 10:18 PM

## 2017-09-09 NOTE — Progress Notes (Signed)
Sonya Frazier is a 22 y.o. G2P0010 at 5632w5d by LMP admitted for induction of labor due to pre-eclampsia with severe features.  Subjective: Patient reporting lightheadedness and weakness. Starting to feel contractions that are painful.  Objective: BP 116/76   Pulse 82   Temp 98.5 F (36.9 C) (Oral)   Resp 15   Ht 5' (1.524 m)   Wt 160 lb 4 oz (72.7 kg)   LMP 11/27/2016   SpO2 98%   BMI 31.30 kg/m  I/O last 3 completed shifts: In: 4297.5 [P.O.:2305; I.V.:1842.5; IV Piggyback:150] Out: 3500 [Urine:3500] Total I/O In: 250 [I.V.:250] Out: 700 [Urine:700]  FHT:  FHR: 115 bpm, variability: moderate,  accelerations:  Present,  decelerations:  Absent UC:   Irregular, every 2-6 min   SVE:   Dilation: 1 Effacement (%): Thick Station: -3 Exam by:: Dr.Justinn Welter  SROM  Labs: Lab Results  Component Value Date   WBC 14.0 (H) 09/08/2017   HGB 8.0 (L) 09/08/2017   HCT 24.6 (L) 09/08/2017   MCV 79.6 09/08/2017   PLT 275 09/08/2017    Assessment / Plan: IOL for preeclampsia with severe features.  Not in labor  Labor: Continue cytotec. FB palced.  Preeclampsia:  on magnesium sulfate. BPs normal. Will get a magnesium level on patient due to symptoms. Fetal Wellbeing:  Category I Pain Control:  IV pain meds I/D:  PCN Anticipated MOD:  NSVD  Caryl AdaJazma Lavender Stanke, DO 09/09/2017, 9:50 AM

## 2017-09-10 ENCOUNTER — Encounter (HOSPITAL_COMMUNITY)
Admission: AD | Disposition: A | Discharge status: Home / Self Care | Payer: Self-pay | Source: Ambulatory Visit | Attending: Obstetrics and Gynecology

## 2017-09-10 ENCOUNTER — Inpatient Hospital Stay (HOSPITAL_COMMUNITY): Admission: RE | Admit: 2017-09-10 | Payer: Medicaid Other | Source: Ambulatory Visit

## 2017-09-10 ENCOUNTER — Encounter (HOSPITAL_COMMUNITY): Payer: Self-pay | Admitting: *Deleted

## 2017-09-10 DIAGNOSIS — Z98891 History of uterine scar from previous surgery: Secondary | ICD-10-CM

## 2017-09-10 DIAGNOSIS — O1414 Severe pre-eclampsia complicating childbirth: Secondary | ICD-10-CM

## 2017-09-10 DIAGNOSIS — Z3A41 41 weeks gestation of pregnancy: Secondary | ICD-10-CM

## 2017-09-10 LAB — CBC
HCT: 25.9 % — ABNORMAL LOW (ref 36.0–46.0)
HCT: 23.1 % — ABNORMAL LOW (ref 36.0–46.0)
Hemoglobin: 8.3 g/dL — ABNORMAL LOW (ref 12.0–15.0)
Hemoglobin: 7.5 g/dL — ABNORMAL LOW (ref 12.0–15.0)
MCH: 25.8 pg — AB (ref 26.0–34.0)
MCH: 26.5 pg (ref 26.0–34.0)
MCHC: 32 g/dL (ref 30.0–36.0)
MCHC: 32.5 g/dL (ref 30.0–36.0)
MCV: 80.4 fL (ref 78.0–100.0)
MCV: 81.6 fL (ref 78.0–100.0)
PLATELETS: 254 10*3/uL (ref 150–400)
PLATELETS: 235 10*3/uL (ref 150–400)
RBC: 3.22 MIL/uL — ABNORMAL LOW (ref 3.87–5.11)
RBC: 2.83 MIL/uL — ABNORMAL LOW (ref 3.87–5.11)
RDW: 15 % (ref 11.5–15.5)
RDW: 15.1 % (ref 11.5–15.5)
WBC: 14.8 10*3/uL — ABNORMAL HIGH (ref 4.0–10.5)
WBC: 22.2 10*3/uL — AB (ref 4.0–10.5)

## 2017-09-10 LAB — PREPARE RBC (CROSSMATCH)

## 2017-09-10 LAB — COMPREHENSIVE METABOLIC PANEL
ALBUMIN: 2.6 g/dL — AB (ref 3.5–5.0)
ALT: 10 U/L — ABNORMAL LOW (ref 14–54)
AST: 23 U/L (ref 15–41)
Alkaline Phosphatase: 204 U/L — ABNORMAL HIGH (ref 38–126)
Anion gap: 10 (ref 5–15)
BILIRUBIN TOTAL: 0.6 mg/dL (ref 0.3–1.2)
CHLORIDE: 105 mmol/L (ref 101–111)
CO2: 22 mmol/L (ref 22–32)
Calcium: 6.2 mg/dL — CL (ref 8.9–10.3)
Creatinine, Ser: 0.77 mg/dL (ref 0.44–1.00)
GFR calc Af Amer: >60 mL/min (ref >60)
GFR calc non Af Amer: >60 mL/min (ref >60)
GLUCOSE: 103 mg/dL — AB (ref 65–99)
Potassium: 3 mmol/L — ABNORMAL LOW (ref 3.5–5.1)
SODIUM: 137 mmol/L (ref 135–145)
TOTAL PROTEIN: 6.7 g/dL (ref 6.5–8.1)

## 2017-09-10 SURGERY — Surgical Case
Anesthesia: Epidural | Site: Abdomen | Wound class: Clean Contaminated

## 2017-09-10 MED ORDER — ACETAMINOPHEN 325 MG PO TABS
650.0000 mg | ORAL_TABLET | ORAL | Status: DC | PRN
  Administered 2017-09-12: 650 mg via ORAL
  Filled 2017-09-10: qty 2

## 2017-09-10 MED ORDER — LABETALOL HCL 5 MG/ML IV SOLN: 20.0000 mg | INTRAVENOUS | Status: DC | PRN

## 2017-09-10 MED ORDER — PRENATAL MULTIVITAMIN CH
1.0000 | ORAL_TABLET | Freq: Every day | ORAL | Status: DC
  Administered 2017-09-11 – 2017-09-13 (×3): 1 via ORAL
  Filled 2017-09-10 (×3): qty 1

## 2017-09-10 MED ORDER — MORPHINE SULFATE (PF) 0.5 MG/ML IJ SOLN
INTRAMUSCULAR | Status: DC | PRN
  Administered 2017-09-10: 3 mg via EPIDURAL

## 2017-09-10 MED ORDER — TETANUS-DIPHTH-ACELL PERTUSSIS 5-2.5-18.5 LF-MCG/0.5 IM SUSP: 0.5000 mL | Freq: Once | INTRAMUSCULAR | Status: DC

## 2017-09-10 MED ORDER — FENTANYL CITRATE (PF) 100 MCG/2ML IJ SOLN
INTRAMUSCULAR | Status: AC
  Filled 2017-09-10: qty 2

## 2017-09-10 MED ORDER — LACTATED RINGERS IV SOLN
INTRAVENOUS | Status: DC
  Administered 2017-09-10 (×2): via INTRAUTERINE

## 2017-09-10 MED ORDER — CALCIUM CARBONATE 1250 (500 CA) MG PO TABS
1.0000 | ORAL_TABLET | Freq: Every day | ORAL | Status: DC
  Administered 2017-09-10: 500 mg via ORAL
  Filled 2017-09-10 (×2): qty 1

## 2017-09-10 MED ORDER — HYDRALAZINE HCL 20 MG/ML IJ SOLN: 10.0000 mg | Freq: Once | INTRAMUSCULAR | Status: DC | PRN

## 2017-09-10 MED ORDER — ZOLPIDEM TARTRATE 5 MG PO TABS: 5.0000 mg | ORAL_TABLET | Freq: Every evening | ORAL | Status: DC | PRN

## 2017-09-10 MED ORDER — COCONUT OIL OIL: 1.0000 "application " | TOPICAL_OIL | Status: DC | PRN

## 2017-09-10 MED ORDER — SENNOSIDES-DOCUSATE SODIUM 8.6-50 MG PO TABS
2.0000 | ORAL_TABLET | ORAL | Status: DC
  Administered 2017-09-10 – 2017-09-12 (×3): 2 via ORAL
  Filled 2017-09-10 (×3): qty 2

## 2017-09-10 MED ORDER — LACTATED RINGERS IV SOLN
INTRAVENOUS | Status: DC
  Administered 2017-09-11 (×2): via INTRAVENOUS

## 2017-09-10 MED ORDER — MENTHOL 3 MG MT LOZG: 1.0000 | LOZENGE | OROMUCOSAL | Status: DC | PRN

## 2017-09-10 MED ORDER — MORPHINE SULFATE (PF) 0.5 MG/ML IJ SOLN
INTRAMUSCULAR | Status: AC
  Filled 2017-09-10: qty 10

## 2017-09-10 MED ORDER — ENOXAPARIN SODIUM 40 MG/0.4ML ~~LOC~~ SOLN
40.0000 mg | SUBCUTANEOUS | Status: DC
  Administered 2017-09-11 – 2017-09-13 (×3): 40 mg via SUBCUTANEOUS
  Filled 2017-09-10 (×4): qty 0.4

## 2017-09-10 MED ORDER — PHENYLEPHRINE HCL 10 MG/ML IJ SOLN
INTRAMUSCULAR | Status: DC | PRN
  Administered 2017-09-10: 80 ug via INTRAVENOUS

## 2017-09-10 MED ORDER — SODIUM CHLORIDE 0.9 % IR SOLN
Status: DC | PRN
  Administered 2017-09-10: 1

## 2017-09-10 MED ORDER — SODIUM CHLORIDE 0.9 % IV SOLN: Freq: Once | INTRAVENOUS | Status: DC

## 2017-09-10 MED ORDER — DEXTROSE 5 % IV SOLN
500.0000 mg | Freq: Once | INTRAVENOUS | Status: AC
  Administered 2017-09-10: 500 mg via INTRAVENOUS
  Filled 2017-09-10: qty 500

## 2017-09-10 MED ORDER — MAGNESIUM SULFATE 40 G IN LACTATED RINGERS - SIMPLE
2.0000 g/h | INTRAVENOUS | Status: AC
  Administered 2017-09-11: 2 g/h via INTRAVENOUS
  Filled 2017-09-10: qty 40
  Filled 2017-09-10: qty 500

## 2017-09-10 MED ORDER — LACTATED RINGERS IV SOLN
INTRAVENOUS | Status: DC | PRN
  Administered 2017-09-10: 18:00:00 via INTRAVENOUS

## 2017-09-10 MED ORDER — DIBUCAINE 1 % RE OINT: 1.0000 "application " | TOPICAL_OINTMENT | RECTAL | Status: DC | PRN

## 2017-09-10 MED ORDER — ONDANSETRON HCL 4 MG/2ML IJ SOLN
INTRAMUSCULAR | Status: DC | PRN
  Administered 2017-09-10: 4 mg via INTRAVENOUS

## 2017-09-10 MED ORDER — SIMETHICONE 80 MG PO CHEW: 80.0000 mg | CHEWABLE_TABLET | ORAL | Status: DC | PRN

## 2017-09-10 MED ORDER — MEPERIDINE HCL 25 MG/ML IJ SOLN: 6.2500 mg | INTRAMUSCULAR | Status: DC | PRN

## 2017-09-10 MED ORDER — DEXAMETHASONE SODIUM PHOSPHATE 4 MG/ML IJ SOLN
INTRAMUSCULAR | Status: DC | PRN
  Administered 2017-09-10: 4 mg via INTRAVENOUS

## 2017-09-10 MED ORDER — CEFAZOLIN SODIUM-DEXTROSE 2-4 GM/100ML-% IV SOLN
2.0000 g | Freq: Once | INTRAVENOUS | Status: DC
  Filled 2017-09-10: qty 100

## 2017-09-10 MED ORDER — CALCIUM CARBONATE 1250 (500 CA) MG PO TABS
1.0000 | ORAL_TABLET | Freq: Every day | ORAL | Status: DC
  Administered 2017-09-11 – 2017-09-13 (×3): 500 mg via ORAL
  Filled 2017-09-10 (×4): qty 1

## 2017-09-10 MED ORDER — SCOPOLAMINE 1 MG/3DAYS TD PT72
MEDICATED_PATCH | TRANSDERMAL | Status: DC | PRN
  Administered 2017-09-10: 1 via TRANSDERMAL

## 2017-09-10 MED ORDER — SCOPOLAMINE 1 MG/3DAYS TD PT72
MEDICATED_PATCH | TRANSDERMAL | Status: AC
  Filled 2017-09-10: qty 1

## 2017-09-10 MED ORDER — SIMETHICONE 80 MG PO CHEW
80.0000 mg | CHEWABLE_TABLET | ORAL | Status: DC
  Administered 2017-09-10 – 2017-09-12 (×2): 80 mg via ORAL
  Filled 2017-09-10 (×3): qty 1

## 2017-09-10 MED ORDER — OXYTOCIN 40 UNITS IN LACTATED RINGERS INFUSION - SIMPLE MED: 2.5000 [IU]/h | INTRAVENOUS | Status: AC

## 2017-09-10 MED ORDER — OXYTOCIN 10 UNIT/ML IJ SOLN
INTRAVENOUS | Status: DC | PRN
  Administered 2017-09-10: 40 [IU] via INTRAVENOUS

## 2017-09-10 MED ORDER — DIPHENHYDRAMINE HCL 25 MG PO CAPS
25.0000 mg | ORAL_CAPSULE | Freq: Four times a day (QID) | ORAL | Status: DC | PRN
  Administered 2017-09-10 – 2017-09-11 (×2): 25 mg via ORAL
  Filled 2017-09-10 (×2): qty 1

## 2017-09-10 MED ORDER — DEXAMETHASONE SODIUM PHOSPHATE 4 MG/ML IJ SOLN
INTRAMUSCULAR | Status: AC
Start: 2017-09-10 — End: 2017-09-10
  Filled 2017-09-10: qty 1

## 2017-09-10 MED ORDER — IBUPROFEN 600 MG PO TABS
600.0000 mg | ORAL_TABLET | Freq: Four times a day (QID) | ORAL | Status: DC
  Administered 2017-09-10 – 2017-09-13 (×11): 600 mg via ORAL
  Filled 2017-09-10 (×11): qty 1

## 2017-09-10 MED ORDER — SODIUM BICARBONATE 8.4 % IV SOLN
INTRAVENOUS | Status: DC | PRN
  Administered 2017-09-10 (×3): 5 mL via EPIDURAL

## 2017-09-10 MED ORDER — WITCH HAZEL-GLYCERIN EX PADS: 1.0000 "application " | MEDICATED_PAD | CUTANEOUS | Status: DC | PRN

## 2017-09-10 MED ORDER — DEXAMETHASONE SODIUM PHOSPHATE 10 MG/ML IJ SOLN
INTRAMUSCULAR | Status: DC | PRN
  Administered 2017-09-10: 10 mg via INTRAVENOUS

## 2017-09-10 MED ORDER — CEFAZOLIN SODIUM-DEXTROSE 2-3 GM-%(50ML) IV SOLR
INTRAVENOUS | Status: DC | PRN
  Administered 2017-09-10: 2 g via INTRAVENOUS

## 2017-09-10 MED ORDER — FENTANYL CITRATE (PF) 100 MCG/2ML IJ SOLN
25.0000 ug | INTRAMUSCULAR | Status: DC | PRN
  Administered 2017-09-10: 50 ug via INTRAVENOUS

## 2017-09-10 MED ORDER — SIMETHICONE 80 MG PO CHEW
80.0000 mg | CHEWABLE_TABLET | Freq: Three times a day (TID) | ORAL | Status: DC
  Administered 2017-09-11 – 2017-09-13 (×7): 80 mg via ORAL
  Filled 2017-09-10 (×7): qty 1

## 2017-09-10 SURGICAL SUPPLY — 39 items
ADH SKN CLS APL DERMABOND .7 (GAUZE/BANDAGES/DRESSINGS) ×1
CHLORAPREP W/TINT 26ML (MISCELLANEOUS) ×6 IMPLANT
CLAMP CORD UMBIL (MISCELLANEOUS) IMPLANT
CLOTH BEACON ORANGE TIMEOUT ST (SAFETY) ×3 IMPLANT
DERMABOND ADVANCED (GAUZE/BANDAGES/DRESSINGS) ×2
DERMABOND ADVANCED .7 DNX12 (GAUZE/BANDAGES/DRESSINGS) ×2 IMPLANT
DRSG OPSITE POSTOP 4X10 (GAUZE/BANDAGES/DRESSINGS) ×3 IMPLANT
ELECT REM PT RETURN 9FT ADLT (ELECTROSURGICAL) ×3
ELECTRODE REM PT RTRN 9FT ADLT (ELECTROSURGICAL) ×1 IMPLANT
EXTRACTOR VACUUM BELL STYLE (SUCTIONS) IMPLANT
GLOVE BIOGEL PI IND STRL 7.0 (GLOVE) ×1 IMPLANT
GLOVE BIOGEL PI IND STRL 8 (GLOVE) ×1 IMPLANT
GLOVE BIOGEL PI INDICATOR 7.0 (GLOVE) ×2
GLOVE BIOGEL PI INDICATOR 8 (GLOVE) ×2
GLOVE ECLIPSE 8.0 STRL XLNG CF (GLOVE) ×3 IMPLANT
GOWN STRL REUS W/TWL LRG LVL3 (GOWN DISPOSABLE) ×6 IMPLANT
KIT ABG SYR 3ML LUER SLIP (SYRINGE) ×3 IMPLANT
NDL HYPO 18GX1.5 BLUNT FILL (NEEDLE) ×1 IMPLANT
NDL HYPO 25X5/8 SAFETYGLIDE (NEEDLE) ×1 IMPLANT
NEEDLE HYPO 18GX1.5 BLUNT FILL (NEEDLE) ×3 IMPLANT
NEEDLE HYPO 22GX1.5 SAFETY (NEEDLE) ×3 IMPLANT
NEEDLE HYPO 25X5/8 SAFETYGLIDE (NEEDLE) ×3 IMPLANT
NS IRRIG 1000ML POUR BTL (IV SOLUTION) ×3 IMPLANT
PACK C SECTION WH (CUSTOM PROCEDURE TRAY) ×3 IMPLANT
PAD OB MATERNITY 4.3X12.25 (PERSONAL CARE ITEMS) ×3 IMPLANT
PENCIL SMOKE EVAC W/HOLSTER (ELECTROSURGICAL) ×3 IMPLANT
RTRCTR C-SECT PINK 25CM LRG (MISCELLANEOUS) IMPLANT
SUT CHROMIC 0 CT 1 (SUTURE) ×3 IMPLANT
SUT MNCRL 0 VIOLET CTX 36 (SUTURE) ×2 IMPLANT
SUT MONOCRYL 0 CTX 36 (SUTURE) ×6
SUT PLAIN 2 0 (SUTURE)
SUT PLAIN 2 0 XLH (SUTURE) IMPLANT
SUT PLAIN ABS 2-0 CT1 27XMFL (SUTURE) IMPLANT
SUT VIC AB 0 CTX 36 (SUTURE) ×3
SUT VIC AB 0 CTX36XBRD ANBCTRL (SUTURE) ×1 IMPLANT
SUT VIC AB 4-0 KS 27 (SUTURE) IMPLANT
SYR 20CC LL (SYRINGE) ×6 IMPLANT
TOWEL OR 17X24 6PK STRL BLUE (TOWEL DISPOSABLE) ×3 IMPLANT
TRAY FOLEY BAG SILVER LF 14FR (SET/KITS/TRAYS/PACK) IMPLANT

## 2017-09-10 NOTE — Progress Notes (Signed)
Labor Progress Note Sonya Frazier is a 22 y.o. G2P0010 at 1610w0d presented for IOL for severe preeclampsia. S: No complaints  O:  BP 113/80   Pulse 86   Temp 97.8 F (36.6 C) (Axillary)   Resp 18   Ht 5' (1.524 m)   Wt 72.7 kg (160 lb 4 oz)   LMP 11/27/2016   SpO2 95%   BMI 31.30 kg/m  EFM: 130 bpm/mod var/no decels  CVE: Dilation: 5 Effacement (%): 80 Cervical Position: Middle Station: -2 Presentation: Vertex Exam by:: Cherica Heiden   A&P: 22 y.o. G2P0010 3810w0d here for IOL for sever preeclampsia #Labor: Still no cervical change since 2200 last night. Cervix is swollen and edematous. MVUs now 190. Will reassess around 5 pm. #preeclampsia with severe features: repeat labs pending. Continue magnesium.  [name], DO 11:10 AM

## 2017-09-10 NOTE — Progress Notes (Signed)
Labor Progress Note Sonya Frazier is a 22 y.o. G2P0010 at 569w0d presented for IOL for preeclampsia. S: Patient is more uncomfortable, screaming with contractions.  O:  BP 115/72   Pulse 92   Temp 98.4 F (36.9 C) (Oral)   Resp 20   Ht 5' (1.524 m)   Wt 72.7 kg (160 lb 4 oz)   LMP 11/27/2016   SpO2 95%   BMI 31.30 kg/m  EFM: 130 bpm/mod var/no decels  CVE: Dilation: 6 Effacement (%): 70 Cervical Position: Middle Station: -1 Presentation: Vertex Exam by:: Dr. Rachelle HoraMoss   A&P: 22 y.o. G2P0010 749w0d here for IOL for preeclampsia. #Labor: Has made small amount of cervical change. MVUs now adequate. Continue pitocin at this time. Call anesthesia to adjust epidural.  #preeclampsia with severe features: continue magnesium #hypocalcemia: corrected calcium is 7.3.replete with po calcium.  [name], DO 3:15 PM

## 2017-09-10 NOTE — Progress Notes (Signed)
Evaluated patient at bedside for deep variable deceleration. Pitocin turned off and heart rate returned to normal. Discussed with Drs. Eure and PiochePickens. Will take patient to c-section to fetal intolerance to labor since this is second time we have had to turn off pitocin due to fetal intolerance. The risks of cesarean section were discussed with the patient including but were not limited to: bleeding which may require transfusion or reoperation; infection which may require antibiotics; injury to bowel, bladder, ureters or other surrounding organs; injury to the fetus; need for additional procedures including hysterectomy in the event of a life-threatening hemorrhage; placental abnormalities wth subsequent pregnancies, incisional problems, thromboembolic phenomenon and other postoperative/anesthesia complications.   The patient concurred with the proposed plan, giving informed written consent for the procedure.  Ancef and azithromycin ordered. Two units of PRBCs are prepared in case of postpartum hemorrhage. Hgb on admission was 8.3. To OR when ready.  Rolm BookbinderAmber Mirelle Biskup, DO

## 2017-09-10 NOTE — Progress Notes (Signed)
Labor Progress Note Deniece ReeJennell K Daye is a 22 y.o. G2P0010 at 9740w0d presented for IOL for preeclampsia with severe features. S: No complaints  O:  BP 111/66   Pulse 96   Temp 97.8 F (36.6 C) (Oral)   Resp 18   Ht 5' (1.524 m)   Wt 72.7 kg (160 lb 4 oz)   LMP 11/27/2016   SpO2 95%   BMI 31.30 kg/m  EFM: 115 bpm/mod var/no decels  CVE: Dilation: 4.5 Effacement (%): 70 Cervical Position: Middle Station: -2 Presentation: Vertex Exam by:: degele   A&P: 22 y.o. G2P0010 7940w0d here for IOL for preeclampsia with severe features. #Labor: Essentially no cervical change since 2200 last night, has not been adequate on pitocin. IUPC in place. MVUs 120. Continue pitocin until adequate contraction pattern.  #preeclampsia with severe features: continue magnesium infusion. Blood pressure well controlled.  Kadir Azucena, DO 9:30 AM

## 2017-09-10 NOTE — Op Note (Signed)
Sonya Frazier PROCEDURE DATE: 09/10/2017  PREOPERATIVE DIAGNOSES: Intrauterine pregnancy at 3626w0d weeks gestation; non-reassuring fetal status  POSTOPERATIVE DIAGNOSES: The same  PROCEDURE: Primary Low Transverse Cesarean Section  SURGEON, primary: Duane LopeLuther Eure, MD SURGEON, fellow: Sonya BookbinderAmber Leylani Duley, DO  ANESTHESIOLOGY TEAM: Anesthesiologist: Marcene DuosFitzgerald, Robert, MD; Cecile Hearingurk, Stephen Edward, MD CRNA: Elgie CongoMalinova, Nataliya H, CRNA; Rhymer, Doree Fudgeolleen S, CRNA  INDICATIONS: Sonya Frazier is a 22 y.o. G2P1011 at 7826w0d here for cesarean section secondary to non-reassuring fetal status; please see preoperative note for further details.  The risks of cesarean section were discussed with the patient including but were not limited to: bleeding which may require transfusion or reoperation; infection which may require antibiotics; injury to bowel, bladder, ureters or other surrounding organs; injury to the fetus; need for additional procedures including hysterectomy in the event of a life-threatening hemorrhage; placental abnormalities wth subsequent pregnancies, incisional problems, thromboembolic phenomenon and other postoperative/anesthesia complications.   The patient concurred with the proposed plan, giving informed written consent for the procedure.    FINDINGS:  Viable female infant in cephalic presentation.  Apgars 8 and 5 and 8.  Meconium amniotic fluid.  Intact placenta, three vessel cord.  Normal uterus, fallopian tubes and ovaries bilaterally.  ANESTHESIA: Epidural  ESTIMATED BLOOD LOSS: 700 ml URINE OUTPUT:  150 ml SPECIMENS: Placenta sent to pathology COMPLICATIONS: None immediate  PROCEDURE IN DETAIL:  The patient preoperatively received intravenous antibiotics and had sequential compression devices applied to her lower extremities.  She was then taken to the operating room where the epidural anesthesia was dosed up to surgical level and was found to be adequate. She was then placed in a dorsal  supine position with a leftward tilt, and prepped and draped in a sterile manner.  A foley catheter was placed into her bladder and attached to constant gravity.  After an adequate timeout was performed, a Pfannenstiel skin incision was made with scalpel and carried through to the underlying layer of fascia. The fascia was incised in the midline, and this incision was extended bilaterally using blunt dissection. The rectus muscles were separated in the midline bluntly and the peritoneum was entered bluntly. Attention was turned to the lower uterine segment where a low transverse hysterotomy was made with a scalpel and extended bilaterally bluntly.  The infant was successfully delivered, the cord was clamped and cut after one minute, and the infant was handed over to the awaiting neonatology team. Uterine massage was then administered, and the placenta delivered intact with a three-vessel cord. The uterus was then cleared of clots and debris.  The hysterotomy was closed with 0 Monocryl in a running locked fashion, and an imbricating layer was also placed with 0 Monocryl.  The pelvis was cleared of all clot and debris. Hemostasis was confirmed on all surfaces.  The peritoneum was closed with a 0 Plain Gut running stitch. The fascia was then closed using 0 Vicryl in a running fashion.  The subcutaneous layer was irrigated.  The skin was closed with a 4-0 Vicryl subcuticular stitch. The patient tolerated the procedure well. Sponge, lap, instrument and needle counts were correct x 3.  She was taken to the recovery room in stable condition.   The patient will continue magnesium infusion for 24 hours following delivery for preeclampsia with severe features.   Sonya BookbinderAmber Keedan Sample, DO Faculty Practice, [facility]

## 2017-09-10 NOTE — Progress Notes (Signed)
Sonya Frazier is a 22 y.o. G2P0010 at 1656w5d by LMP admitted for induction of labor due to pre-eclampsia with severe features.  Subjective: Patient doing well.   Objective: BP 99/60   Pulse (!) 104   Temp 98.6 F (37 C) (Oral)   Resp 18   Ht 5' (1.524 m)   Wt 72.7 kg (160 lb 4 oz)   LMP 11/27/2016   SpO2 98%   BMI 31.30 kg/m  I/O last 3 completed shifts: In: 6447.9 [P.O.:2785; I.V.:3362.9; IV Piggyback:300] Out: 6275 [Urine:6275] Total I/O In: 2367.9 [P.O.:762; I.V.:1505.9; IV Piggyback:100] Out: 1100 [Urine:1100]  FHT:  FHR: 130 bpm, variability: moderate,  accelerations:  Present,  decelerations:  Present was having variable decels but improved with positional changes UC:   Irregular  SVE:   Dilation: 4.5 Effacement (%): 70 Station: -2 Exam by:: Degele  Labs: Lab Results  Component Value Date   WBC 16.6 (H) 09/09/2017   HGB 8.7 (L) 09/09/2017   HCT 27.3 (L) 09/09/2017   MCV 79.4 09/09/2017   PLT 291 09/09/2017    Assessment / Plan: IOL for preeclampsia with severe features  Labor: Pitocin held, IUPC placed Preeclampsia:  on magnesium sulfate and BP normal, UOP is adequate, Mg level 6.2 @1147 . Will monitor for signs of CNS changes. If neuro changes noted will plan to re-check Mg levels  Fetal Wellbeing:  Category II, can consider amnioinfusion if continuing to have variables  Pain Control:  Epidural   I/D:  PCN Anticipated MOD:  NSVD  Sonya ManisSherin Hayven Croy, DO PGY-1 09/10/2017, 3:51 AM

## 2017-09-10 NOTE — Progress Notes (Signed)
Sonya Frazier is a 22 y.o. G2P0010 at 10411w5d by LMP admitted for induction of labor due to pre-eclampsia with severe features.  Subjective: Patient with no questions at this time.   Objective: BP 108/70   Pulse 89   Temp 98.6 F (37 C) (Oral)   Resp 18   Ht 5' (1.524 m)   Wt 72.7 kg (160 lb 4 oz)   LMP 11/27/2016   SpO2 98%   BMI 31.30 kg/m  I/O last 3 completed shifts: In: 9891.4 [P.O.:3297; I.V.:6144.4; IV Piggyback:450] Out: 8100 [Urine:8100] No intake/output data recorded.  FHT:  FHR: 130 bpm, variability: moderate,  accelerations:  Present,  decelerations:  Present early UC:   Irregular, 120 MVU SVE:   Dilation: 4.5 Effacement (%): 70 Station: -2 Exam by:: degele  Labs: Lab Results  Component Value Date   WBC 16.6 (H) 09/09/2017   HGB 8.7 (L) 09/09/2017   HCT 27.3 (L) 09/09/2017   MCV 79.4 09/09/2017   PLT 291 09/09/2017    Assessment / Plan: IOL for preeclampsia with severe features  Labor: Pitocin at 228miliunits/min, contractions not yet adequate. Will continue to titrate pitocin per protocol to achieve adequate contractions Preeclampsia:  on magnesium sulfate and BP normal, UOP is adequate, Mg level 6.2 @1147  on 09/09/17. Will monitor for signs of CNS changes. If neuro changes noted will plan to re-check Mg levels  Fetal Wellbeing:  Category I Pain Control:  Epidural   I/D:  PCN Anticipated MOD:  NSVD  Sonya ManisSherin Joelene Barriere, DO PGY-1 09/10/2017, 7:44 AM

## 2017-09-10 NOTE — Transfer of Care (Signed)
Immediate Anesthesia Transfer of Care Note  Patient: Sonya Frazier  Procedure(s) Performed: CESAREAN SECTION (N/A Abdomen)  Patient Location: PACU  Anesthesia Type:Epidural  Level of Consciousness: awake, alert  and oriented  Airway & Oxygen Therapy: Patient Spontanous Breathing  Post-op Assessment: Report given to RN and Post -op Vital signs reviewed and stable  Post vital signs: Reviewed and stable HR 78, RR 14, SaO2 96%, BP 103/67  Last Vitals:  Vitals:   09/10/17 1632 09/10/17 1701  BP: 136/86 115/65  Pulse: 86 93  Resp:    Temp:    SpO2:      Last Pain:  Vitals:   09/10/17 1655  TempSrc:   PainSc: 10-Worst pain ever         Complications: No apparent anesthesia complications

## 2017-09-10 NOTE — Progress Notes (Signed)
L&D Note  09/10/2017 - 4:28 PM  22 y.o. G2P0010 [redacted]w[redacted]d. Pregnancy complicated by GBS pos, anemia  Patient Active Problem List   Diagnosis Date Noted  . Severe preeclampsia, third trimester 09/08/2017  . Post term pregnancy 09/08/2017  . Positive GBS test 08/09/2017  . Maternal iron deficiency anemia affecting pregnancy in third trimester, antepartum 06/04/2017  . 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 Low vitamin D level 02/11/2017  . Supervision of other normal pregnancy, antepartum 02/09/2017    Ms. EDA MAGNUSSEN is admitted for IOL for pre-eclampsia with severe features on 2/6 late afternoon   Subjective:  Pt feels some UCs but not uncomfortable  Objective:   Vitals:   09/10/17 1501 09/10/17 1531 09/10/17 1601 09/10/17 1623  BP: 115/72 119/81 128/87   Pulse: 92 92 91   Resp:   18   Temp:    97.8 F (36.6 C)  TempSrc:    Oral  SpO2:      Weight:      Height:        Current Vital Signs 24h Vital Sign Ranges  T 97.8 F (36.6 C) Temp  Avg: 98.1 F (36.7 C)  Min: 97.8 F (36.6 C)  Max: 98.6 F (37 C)  BP 128/87 BP  Min: 92/44  Max: 154/80  HR 91 Pulse  Avg: 93.1  Min: 83  Max: 127  RR 18 Resp  Avg: 17.5  Min: 16  Max: 20  SaO2 95 % Not Delivered SpO2  Avg: 95 %  Min: 95 %  Max: 95 %       24 Hour I/O Current Shift I/O  Time Ins Outs 02/07 0701 - 02/08 0700 In: 6134.8 [P.O.:1292; I.V.:4542.8] Out: 5025 [Urine:5025] 02/08 0701 - 02/08 1900 In: 2092 [P.O.:960; I.V.:1032] Out: 2350 [Urine:2350]  UOP: >168mL/hr  FHR: 140 baseline, occasional slight variables and early decels, no accels, mod variability Toco: q5-89m Gen: NAD, pt laying on her left side SVE: deferred  Labs:  Recent Labs  Lab 09/09/17 1147 09/09/17 2217 09/10/17 1153  WBC 15.6* 16.6* 14.8*  HGB 8.7* 8.7* 8.3*  HCT 27.3* 27.3* 25.9*  PLT 296 291 254   Recent Labs  Lab 09/08/17 1512 09/10/17 1153  NA 136 137  K 2.9* 3.0*  CL 108 105  CO2 19* 22  BUN 6 <5*   CREATININE 0.61 0.77  CALCIUM 8.5* 6.2*  PROT 7.0 6.7  BILITOT 0.6 0.6  ALKPHOS 178* 204*  ALT 10* 10*  AST 20 23  GLUCOSE 90 103*    Medications Current Facility-Administered Medications  Medication Dose Route Frequency Provider Last Rate Last Dose  . acetaminophen (TYLENOL) tablet 650 mg  650 mg Oral Q4H PRN Pincus Large, DO      . butalbital-acetaminophen-caffeine (FIORICET, ESGIC) [phone] MG per tablet 2 tablet  2 tablet Oral Q6H PRN Arabella Merles, CNM   2 tablet at 09/08/17 2347  . calcium carbonate (OS-CAL - dosed in mg of elemental calcium) tablet 500 mg of elemental calcium  1 tablet Oral Q breakfast Moss, Amber, DO   500 mg of elemental calcium at 09/10/17 1403  . diphenhydrAMINE (BENADRYL) injection 12.5 mg  12.5 mg Intravenous Q15 min PRN Cecile Hearing, MD      . ePHEDrine injection 10 mg  10 mg Intravenous PRN Cecile Hearing, MD      . ePHEDrine injection 10 mg  10 mg Intravenous PRN Cecile Hearing, MD      .  fentaNYL (SUBLIMAZE) injection 100 mcg  100 mcg Intravenous Q1H PRN Pincus Large, DO   100 mcg at 09/09/17 2003  . fentaNYL 2.5 mcg/ml w/bupivacaine 0.1% in NS epidural infusion (WH-ANES)  14 mL/hr Epidural Continuous PRN Cecile Hearing, MD 14 mL/hr at 09/10/17 1504 14 mL/hr at 09/10/17 1504  . hydrALAZINE (APRESOLINE) injection 10 mg  10 mg Intravenous Once PRN Doroteo Glassman, Jazma Y, DO      . labetalol (NORMODYNE,TRANDATE) injection 20-80 mg  20-80 mg Intravenous Q10 min PRN Doroteo Glassman, Jazma Y, DO      . lactated ringers infusion 500 mL  500 mL Intravenous Once Cecile Hearing, MD      . lactated ringers infusion 500-1,000 mL  500-1,000 mL Intravenous PRN Caryl Ada Y, DO      . lactated ringers infusion   Intravenous Continuous Pincus Large, DO 85 mL/hr at 09/10/17 1625 85 mL/hr at 09/10/17 1625  . lactated ringers infusion   Intrauterine Continuous Moss, Amber, DO 150 mL/hr at 09/10/17 1434    . lidocaine (PF) (XYLOCAINE) 1  % injection 30 mL  30 mL Subcutaneous PRN Caryl Ada Y, DO      . magnesium sulfate 40 grams in LR 500 mL OB infusion  2 g/hr Intravenous Continuous Pincus Large, DO 25 mL/hr at 09/10/17 0528 2 g/hr at 09/10/17 0528  . misoprostol (CYTOTEC) tablet 50 mcg  50 mcg Buccal Q4H PRN Pincus Large, DO   50 mcg at 09/09/17 1800  . ondansetron (ZOFRAN) injection 4 mg  4 mg Intravenous Q6H PRN Caryl Ada Y, DO   4 mg at 09/09/17 1310  . oxyCODONE-acetaminophen (PERCOCET/ROXICET) 5-325 MG per tablet 1 tablet  1 tablet Oral Q4H PRN Pincus Large, DO      . oxyCODONE-acetaminophen (PERCOCET/ROXICET) 5-325 MG per tablet 2 tablet  2 tablet Oral Q4H PRN Pincus Large, DO      . oxytocin (PITOCIN) IV BOLUS FROM BAG  500 mL Intravenous Once Doroteo Glassman, Jazma Y, DO      . oxytocin (PITOCIN) IV infusion 40 units in LR 1000 mL - Premix  2.5 Units/hr Intravenous Continuous Caryl Ada Y, DO      . oxytocin (PITOCIN) IV infusion 40 units in LR 1000 mL - Premix  1-40 milli-units/min Intravenous Titrated Oralia Manis, DO 15 mL/hr at 09/10/17 1625 10 milli-units/min at 09/10/17 1625  . penicillin G potassium 3 Million Units in dextrose 50mL IVPB  3 Million Units Intravenous Q4H Pincus Large, DO   Stopped at 09/10/17 1433  . PHENYLephrine 40 mcg/ml in normal saline Adult IV Push Syringe  80 mcg Intravenous PRN Cecile Hearing, MD      . PHENYLephrine 40 mcg/ml in normal saline Adult IV Push Syringe  80 mcg Intravenous PRN Cecile Hearing, MD   80 mcg at 09/10/17 0409  . sodium citrate-citric acid (ORACIT) solution 30 mL  30 mL Oral Q2H PRN Pincus Large, DO      . sodium phosphate (FLEET) 7-19 GM/118ML enema 1 enema  1 enema Rectal PRN Pincus Large, DO      . terbutaline (BRETHINE) injection 0.25 mg  0.25 mg Subcutaneous Once PRN Doroteo Glassman, Jazma Y, DO      . terbutaline (BRETHINE) injection 0.25 mg  0.25 mg Subcutaneous Once PRN Oralia Manis, DO      . zolpidem (AMBIEN) tablet 5 mg  5 mg Oral  QHS PRN Arabella Merles, CNM   5  mg at 09/09/17 [id number]   Facility-Administered Medications Ordered in Other Encounters  Medication Dose Route Frequency Provider Last Rate Last Dose  . lidocaine (PF) (XYLOCAINE) 1 % injection    Anesthesia Intra-op Cecile Hearingurk, Stephen Edward, MD   3 mL at 09/09/17 2320    Assessment & Plan:  Pt stable *IUP: category I tracing currently *IOL: pitocin turned off about 5328m ago due to variable decels but looks fine now. Continue aminoinfusion. Will restart pit at 10 and titrate per protocol. D/w pt that if fetus doesn't tolerate pitocin and cx doesn't continue to change, likely will need to proceed toward c-section. Pt was 6cm starting at 1500 *Pre-eclampsia with severe features: *Anemia: stable. Two units are crossmatched *GBS: continue PCN. S/p 2 doses already *Analgesia: comfortable with epidural  Cornelia Copaharlie Tiamarie Furnari, Jr. MD Attending Center for [facility](Faculty Practice)

## 2017-09-10 NOTE — Progress Notes (Signed)
CMP shows calcium of 6.2, corrected calcium is 7.3. Will replete with oral calcium. Patient has also had a couple late decelerations that resolved with position changes. Will continue pitocin at this time since EFM shows no decelerations at this time. Discussed calcium dosing with pharmacy. Discussed plan with Dr. Vergie LivingPickens and he agrees. Continue to monitor patient closely.  Rolm BookbinderAmber Vue Pavon, DO

## 2017-09-11 ENCOUNTER — Other Ambulatory Visit: Payer: Self-pay

## 2017-09-11 LAB — CBC
HCT: 20.5 % — ABNORMAL LOW (ref 36.0–46.0)
Hemoglobin: 6.5 g/dL — CL (ref 12.0–15.0)
MCH: 25.2 pg — ABNORMAL LOW (ref 26.0–34.0)
MCHC: 31.7 g/dL (ref 30.0–36.0)
MCV: 79.5 fL (ref 78.0–100.0)
Platelets: 252 10*3/uL (ref 150–400)
RBC: 2.58 MIL/uL — ABNORMAL LOW (ref 3.87–5.11)
RDW: 14.8 % (ref 11.5–15.5)
WBC: 20.9 10*3/uL — ABNORMAL HIGH (ref 4.0–10.5)

## 2017-09-11 MED ORDER — LACTATED RINGERS IV SOLN: INTRAVENOUS | Status: AC

## 2017-09-11 NOTE — Progress Notes (Signed)
Subjective: Postpartum Day 1: Cesarean Delivery/ pre eclampsia Patient reports sleepy from magnesium.    Objective: Vital signs in last 24 hours: Temp:  [97.6 F (36.4 C)-99.1 F (37.3 C)] 98.4 F (36.9 C) (02/09 0805) Pulse Rate:  [31-127] 68 (02/09 0805) Resp:  [13-27] 20 (02/09 0805) BP: (92-149)/(60-97) 113/61 (02/09 0805) SpO2:  [78 %-100 %] 96 % (02/09 0805) Weight:  [153 lb 0.8 oz (69.4 kg)] 153 lb 0.8 oz (69.4 kg) (02/09 0530)  Physical Exam:  General: alert, cooperative and no distress Lochia: appropriate Uterine Fundus: firm Incision: healing well DVT Evaluation: No evidence of DVT seen on physical exam.  Recent Labs    09/10/17 1912 09/11/17 0514  HGB 7.5* 6.5*  HCT 23.1* 20.5*    Assessment/Plan: Status post Cesarean section. Doing well postoperatively.  Continue current care, stop magnesium this evening. Anemia, expected with pre op hemoglobin 8.2  Lazaro ArmsLuther H Eure 09/11/2017, 8:36 AM

## 2017-09-11 NOTE — Anesthesia Postprocedure Evaluation (Signed)
Anesthesia Post Note  Patient: Sonya Frazier  Procedure(s) Performed: CESAREAN SECTION (N/A Abdomen)     Patient location during evaluation: [facility] Anesthesia Type: Epidural Level of consciousness: awake and alert Pain management: pain level controlled Vital Signs Assessment: post-procedure vital signs reviewed and stable Respiratory status: spontaneous breathing Cardiovascular status: blood pressure returned to baseline Postop Assessment: no headache, patient able to bend at knees, no backache, no apparent nausea or vomiting, epidural receding and adequate PO intake Anesthetic complications: no    Last Vitals:  Vitals:   09/11/17 0545 09/11/17 0805  BP: 113/62 113/61  Pulse: 72 68  Resp: (!) 21 20  Temp: 36.8 C 36.9 C  SpO2: 96% 96%    Last Pain:  Vitals:   09/11/17 0805  TempSrc: Oral  PainSc:    Pain Goal: Patients Stated Pain Goal: 2 (09/10/17 2330)               Salome ArntSterling, Vansh Reckart Marie

## 2017-09-12 LAB — TYPE AND SCREEN
ABO/RH(D): A POS
Antibody Screen: NEGATIVE
UNIT DIVISION: 0
Unit division: 0
Unit division: 0
Unit division: 0

## 2017-09-12 LAB — BPAM RBC
BLOOD PRODUCT EXPIRATION DATE: 201903042359
BLOOD PRODUCT EXPIRATION DATE: 201903072359
Blood Product Expiration Date: 201903042359
Blood Product Expiration Date: 201903052359
ISSUE DATE / TIME: 201902081742
ISSUE DATE / TIME: 201902081742
UNIT TYPE AND RH: 6200
UNIT TYPE AND RH: 6200
UNIT TYPE AND RH: 6200
Unit Type and Rh: 6200

## 2017-09-12 LAB — PREPARE RBC (CROSSMATCH)

## 2017-09-12 MED ORDER — SODIUM CHLORIDE 0.9 % IV SOLN: Freq: Once | INTRAVENOUS | Status: DC

## 2017-09-12 MED ORDER — OXYCODONE-ACETAMINOPHEN 5-325 MG PO TABS
1.0000 | ORAL_TABLET | Freq: Four times a day (QID) | ORAL | Status: DC | PRN
  Administered 2017-09-12 (×3): 1 via ORAL
  Filled 2017-09-12 (×3): qty 1

## 2017-09-12 NOTE — Progress Notes (Addendum)
Inserted peripheral IV in left forearm. Re-started blood at 120 mL/hr and took new set of vitals.

## 2017-09-12 NOTE — Progress Notes (Signed)
Subjective: Postpartum Day 2: Cesarean Delivery Patient reports feeling tried and weak. She denies headaches or visual changes. Tolerating diet. Pain controlled. Bottle feeding.  Objective: Vital signs in last 24 hours: Temp:  [98.1 F (36.7 C)-99.1 F (37.3 C)] 99.1 F (37.3 C) (02/10 0400) Pulse Rate:  [60-82] 77 (02/10 0400) Resp:  [15-21] 15 (02/10 0400) BP: (96-124)/(48-71) 124/71 (02/10 0400) SpO2:  [95 %-100 %] 100 % (02/10 0400) Weight:  [68.9 kg (152 lb 0.1 oz)] 68.9 kg (152 lb 0.1 oz) (02/10 0500)  Physical Exam:  General: alert Lochia: appropriate Uterine Fundus: firm Incision: healing well DVT Evaluation: No evidence of DVT seen on physical exam.  Recent Labs    09/10/17 1912 09/11/17 0514  HGB 7.5* 6.5*  HCT 23.1* 20.5*    Assessment/Plan: Status post Cesarean section.  PEC s/p magnesium. BP stable without meds Anemia, will transfuse with PRBC's. R/B reviewed with pt. Pt agreed to transfusion. Hermina Staggers.  Mazel Villela L Kaegan Stigler 09/12/2017, 7:09 AM

## 2017-09-12 NOTE — Progress Notes (Signed)
Blood transfusion started at 11:05. IV infiltrated at 11:15. IV removed and blood on hold. Will re-start once another IV is inserted.

## 2017-09-13 ENCOUNTER — Encounter (HOSPITAL_COMMUNITY): Payer: Self-pay | Admitting: Obstetrics & Gynecology

## 2017-09-13 LAB — BPAM RBC
Blood Product Expiration Date: 201902212359
Blood Product Expiration Date: 201903042359
Blood Product Expiration Date: 201903042359
ISSUE DATE / TIME: 201902101050
ISSUE DATE / TIME: 201902101434
ISSUE DATE / TIME: 201902101741
Unit Type and Rh: 600
Unit Type and Rh: 6200
Unit Type and Rh: 6200

## 2017-09-13 LAB — TYPE AND SCREEN
ABO/RH(D): A POS
Antibody Screen: NEGATIVE
UNIT DIVISION: 0
Unit division: 0
Unit division: 0

## 2017-09-13 LAB — CBC
HCT: 29.3 % — ABNORMAL LOW (ref 36.0–46.0)
Hemoglobin: 9.7 g/dL — ABNORMAL LOW (ref 12.0–15.0)
MCH: 26.9 pg (ref 26.0–34.0)
MCHC: 33.1 g/dL (ref 30.0–36.0)
MCV: 81.2 fL (ref 78.0–100.0)
Platelets: 312 10*3/uL (ref 150–400)
RBC: 3.61 MIL/uL — ABNORMAL LOW (ref 3.87–5.11)
RDW: 15 % (ref 11.5–15.5)
WBC: 12.7 10*3/uL — ABNORMAL HIGH (ref 4.0–10.5)

## 2017-09-13 MED ORDER — IBUPROFEN 600 MG PO TABS: 600.0000 mg | ORAL_TABLET | Freq: Four times a day (QID) | ORAL | 0 refills | Status: DC

## 2017-09-13 MED ORDER — HYDROCHLOROTHIAZIDE 25 MG PO TABS: 25.0000 mg | ORAL_TABLET | Freq: Every day | ORAL | 0 refills | Status: DC

## 2017-09-13 MED ORDER — OXYCODONE-ACETAMINOPHEN 5-325 MG PO TABS: 1.0000 | ORAL_TABLET | Freq: Four times a day (QID) | ORAL | 0 refills | Status: DC | PRN

## 2017-09-13 NOTE — Discharge Summary (Signed)
Physician Discharge Summary  Patient ID: Deniece ReeJennell K Bonzo MRN: [id number] DOB/AGE: [birth year] 21 y.o.  Admit date: 09/08/2017 Discharge date: 09/13/2017  Admission Diagnoses: Severe pre ecalmpsia at 6754w5d  Discharge Diagnoses:  Active Problems:   Severe preeclampsia, third trimester   Post term pregnancy   S/P C-section   Discharged Condition: good  Hospital Course: Patient was admitted on 09/08/2017 for cervical ripening induction of labor due to preeclampsia with headache and blurry vision.  As a result she was begun on magnesium sulfate prophylaxis and underwent induction of labor.  Unfortunately there was deterioration of the fetal heart rate tracing and she underwent a primary cesarean section for nonreassuring fetal heart rate tracing Her postoperative course was unremarkable She received 24 hours of magnesium sulfate and required only 1 as needed dose of blood pressure medicine postop Charged home on hydrochlorothiazide and pain medicine only  Consults: None  Significant Diagnostic Studies: labs:   Treatments: labor induction, primary C section  Discharge Exam: Blood pressure 140/86, pulse 69, temperature 98.4 F (36.9 C), temperature source Oral, resp. rate 18, height 5' (1.524 m), weight 146 lb 0.3 oz (66.2 kg), last menstrual period 11/27/2016, SpO2 100 %, unknown if currently breastfeeding. General appearance: alert, cooperative and no distress GI: soft, non-tender; bowel sounds normal; no masses,  no organomegaly Incision/Wound:clean dry intact  Disposition: 01-Home or Self Care  Discharge Instructions    Call MD for:  persistant nausea and vomiting   Complete by:  As directed    Call MD for:  severe uncontrolled pain   Complete by:  As directed    Call MD for:  temperature >100.4   Complete by:  As directed    Diet - low sodium heart healthy   Complete by:  As directed    Driving Restrictions   Complete by:  As directed    No driving for 1` week   Increase  activity slowly   Complete by:  As directed    Leave dressing on - Keep it clean, dry, and intact until clinic visit   Complete by:  As directed    Lifting restrictions   Complete by:  As directed    No lifting more than the baby   Sexual Activity Restrictions   Complete by:  As directed    No sex for 6 weeks     Allergies as of 09/13/2017   No Known Allergies     Medication List    STOP taking these medications   acetaminophen 500 MG tablet Commonly known as:  TYLENOL     TAKE these medications   hydrochlorothiazide 25 MG tablet Commonly known as:  HYDRODIURIL Take 1 tablet (25 mg total) by mouth daily.   ibuprofen 600 MG tablet Commonly known as:  ADVIL,MOTRIN Take 1 tablet (600 mg total) by mouth every 6 (six) hours.   OB COMPLETE PETITE 35-5-1-200 MG Caps Take 1 tablet by mouth daily.   oxyCODONE-acetaminophen 5-325 MG tablet Commonly known as:  PERCOCET/ROXICET Take 1-2 tablets by mouth every 6 (six) hours as needed for moderate pain.      Follow-up Information    [facility] Follow up in 1 week(s).   Specialty:  Obstetrics and Gynecology Why:  post op Contact information: [address], [address] [place] [id number] [phone]       [facility] .   Specialty:  Obstetrics and Gynecology Contact information: [address], [address] [place]  [address] [phone]          Signed: Lazaro Arms 09/13/2017, 8:20 AM

## 2017-09-13 NOTE — Progress Notes (Signed)
Ambulated out with family 

## 2017-09-13 NOTE — Discharge Instructions (Signed)

## 2017-09-16 ENCOUNTER — Telehealth: Payer: Self-pay | Admitting: *Deleted

## 2017-09-16 NOTE — Telephone Encounter (Signed)
Lft vmail fpr patient to call back and schedule PP visit and a Circumcision for her baby.Marland Kitchen..Marland Kitchen

## 2017-09-17 ENCOUNTER — Ambulatory Visit (INDEPENDENT_AMBULATORY_CARE_PROVIDER_SITE_OTHER): Payer: Medicaid Other

## 2017-09-17 VITALS — BP 144/99 | HR 96 | Wt 143.7 lb

## 2017-09-17 DIAGNOSIS — Z98891 History of uterine scar from previous surgery: Secondary | ICD-10-CM

## 2017-09-17 DIAGNOSIS — Z0130 Encounter for examination of blood pressure without abnormal findings: Secondary | ICD-10-CM | POA: Diagnosis not present

## 2017-09-17 NOTE — Progress Notes (Signed)
History:  [name] is a 22 y.o. G2P1011 who presents to clinic today for a blood pressure check and incision check. She is 1 week post op from a primary cesarean section after a failed induction for preeclampsia. She was started on HCTZ on 2/11 and discharged from the hospital.   The following portions of the patient's history were reviewed and updated as appropriate: allergies, current medications, family history, past medical history, social history, past surgical history and problem list.  Review of Systems:  Review of Systems  Constitutional: Negative.  Negative for chills and fever.  Respiratory: Negative.   Cardiovascular: Negative.  Negative for chest pain.  Genitourinary: Negative.   Neurological: Negative.  Negative for dizziness and headaches.      Objective:  Physical Exam BP (!) 144/99 (BP Location: Left Arm, Patient Position: Sitting, Cuff Size: Normal)   Pulse 96   Wt 143 lb 11.2 oz (65.2 kg)   LMP 11/27/2016   BMI 28.06 kg/m  Physical Exam  Constitutional: She is oriented to person, place, and time. She appears well-developed and well-nourished. No distress.  HENT:  Head: Normocephalic.  Eyes: Pupils are equal, round, and reactive to light.  Neck: Normal range of motion.  Cardiovascular: Normal rate and regular rhythm.  Pulmonary/Chest: Effort normal and breath sounds normal. No respiratory distress.  Abdominal: Soft. There is no tenderness.  Musculoskeletal: Normal range of motion.  Neurological: She is alert and oriented to person, place, and time.  Skin: Skin is warm and dry.  Incision clean, dry and intact. Honeycomb still in place.  Psychiatric: She has a normal mood and affect. Her behavior is normal. Judgment and thought content normal.  Nursing note and vitals reviewed.  Assessment & Plan:  1. Status post cesarean section -Incision appears to be healing well. Patient desires to remove honeycomb at home. Reviewed instructions and signs and  symptoms of infection.  2. Blood pressure check -140s/90s today but only started HCTZ 4 days ago. Denies any HA, visual changes or epigastric pain. Will have patient return in 1 week for repeat BP and consider adjusting medication at that time.   Rolm Bookbindereill, Nasri Boakye M, [place] 09/17/2017 9:42 AM

## 2017-09-22 ENCOUNTER — Ambulatory Visit: Payer: Self-pay | Admitting: Hematology

## 2017-09-22 ENCOUNTER — Other Ambulatory Visit: Payer: Self-pay

## 2017-10-07 ENCOUNTER — Ambulatory Visit (INDEPENDENT_AMBULATORY_CARE_PROVIDER_SITE_OTHER): Payer: Medicaid Other | Admitting: Certified Nurse Midwife

## 2017-10-07 ENCOUNTER — Encounter: Payer: Self-pay | Admitting: *Deleted

## 2017-10-07 VITALS — BP 116/79 | HR 97 | Wt 139.0 lb

## 2017-10-07 DIAGNOSIS — Z3483 Encounter for supervision of other normal pregnancy, third trimester: Secondary | ICD-10-CM

## 2017-10-07 DIAGNOSIS — Z1389 Encounter for screening for other disorder: Secondary | ICD-10-CM | POA: Diagnosis not present

## 2017-10-07 DIAGNOSIS — Z3042 Encounter for surveillance of injectable contraceptive: Secondary | ICD-10-CM

## 2017-10-07 DIAGNOSIS — Z30013 Encounter for initial prescription of injectable contraceptive: Secondary | ICD-10-CM

## 2017-10-07 MED ORDER — MEDROXYPROGESTERONE ACETATE 150 MG/ML IM SUSP
150.0000 mg | Freq: Once | INTRAMUSCULAR | Status: AC
  Administered 2017-10-07: 150 mg via INTRAMUSCULAR

## 2017-10-07 NOTE — Progress Notes (Signed)
Post Partum Exam  Sonya Frazier is a 22 y.o. 512P1011 female who presents for a postpartum visit. She is 5 weeks postpartum following a low cervical transverse Cesarean section. I have fully reviewed the prenatal and intrapartum course. The delivery was at 41 gestational weeks.  Anesthesia: epidural. Postpartum course has been doing well. Baby's course has been doing well. Baby is feeding by bottle - Gerber Gentle. Bleeding no bleeding. Bowel function is normal. Bladder function is normal. Patient is not sexually active. Contraception method is abstinence. Pt is interested in Depo. Postpartum depression screening:neg, score 9.  Feeling overwhelmed.  Denies homicidal/suicidal thoughts.   The following portions of the patient's history were reviewed and updated as appropriate: allergies, current medications, past family history, past medical history, past social history, past surgical history and problem list. Last pap smear done 01/2017 and was Abnormal- ASCUS with HPV  Review of Systems Pertinent items noted in HPI and remainder of comprehensive ROS otherwise negative.    Objective:  Last menstrual period 11/27/2016, unknown if currently breastfeeding.  General:  alert, cooperative and no distress   Breasts:  inspection negative, no nipple discharge or bleeding, no masses or nodularity palpable  Lungs: clear to auscultation bilaterally  Heart:  regular rate and rhythm, S1, S2 normal, no murmur, click, rub or gallop  Abdomen: soft, non-tender; bowel sounds normal; no masses,  no organomegaly, obese.  C/S wound healed: no s/s infection, no draining, non tender to palpation.    Pelvic/Rectal Exam: Not performed.        Assessment:    Normal 5 week postpartum exam. Pap smear not done at today's visit.   Plan:   1. Contraception: Depo-Provera injections 2. Depo provera given today in office. Monitor mild postpartum depression.  3. Follow up in: 6 months for pap smear, 3 months for depo  injection or as needed.

## 2017-10-11 ENCOUNTER — Encounter: Payer: Self-pay | Admitting: Certified Nurse Midwife

## 2017-10-11 NOTE — Patient Instructions (Addendum)
Depression Screening Depression screening is a tool that your health care provider can use to learn if you have symptoms of depression. Depression is a common condition with many symptoms that are also often found in other conditions. Depression is treatable, but it must first be diagnosed. You may not know that certain feelings, thoughts, and behaviors that you are having can be symptoms of depression. Taking a depression screening test can help you and your health care provider decide if you need more assessment, or if you should be referred to a mental health care provider. What are the screening tests?  You may have a physical exam to see if another condition is affecting your mental health. You may have a blood or urine sample taken during the physical exam.  You may be interviewed using a screening tool that was developed from research, such as one of these: ? Patient Health Questionnaire (PHQ). This is a set of either 2 or 9 questions. A health care provider who has been trained to score this screening test uses a guide to assess if your symptoms suggest that you may have depression. ? Hamilton Depression Rating Scale (HAM-D). This is a set of either 17 or 24 questions. You may be asked to take it again during or after your treatment, to see if your depression has gotten better. ? Beck Depression Inventory (BDI). This is a set of 21 multiple choice questions. Your health care provider scores your answers to assess:  Your level of depression, ranging from mild to severe.  Your response to treatment.  Your health care provider may talk with you about your daily activities, such as eating, sleeping, work, and recreation, and ask if you have had any changes in activity.  Your health care provider may ask you to see a mental health specialist, such as a psychiatrist or psychologist, for more evaluation. Who should be screened for depression?  All adults, including adults with a family history  of a mental health disorder.  Adolescents who are 12-18 years old.  People who are recovering from a myocardial infarction (MI).  Pregnant women, or women who have given birth.  People who have a long-term (chronic) illness.  Anyone who has been diagnosed with another type of a mental health disorder.  Anyone who has symptoms that could show depression. What do my results mean? Your health care provider will review the results of your depression screening, physical exam, and lab tests. Positive screens suggest that you may have depression. Screening is the first step in getting the care that you may need. It is up to you to get your screening results. Ask your health care provider, or the department that is doing your screening tests, when your results will be ready. Talk with your health care provider about your results and diagnosis. A diagnosis of depression is made using the Diagnostic and Statistical Manual of Mental Disorders (DSM-V). This is a book that lists the number and type of symptoms that must be present for a health care provider to give a specific diagnosis.  Your health care provider may work with you to treat your symptoms of depression, or your health care provider may help you find a mental health provider who can assess, diagnose, and treat your depression. Get help right away if:  You have thoughts about hurting yourself or others. If you ever feel like you may hurt yourself or others, or have thoughts about taking your own life, get help right away. You can   go to your nearest emergency department or call:  Your local emergency services (911 in the [place]  A suicide crisis helpline, such as the National Suicide Prevention Lifeline at 1-800-273-8255. This is open 24 hours a day.  Summary  Depression screening is the first step in getting the help that you may need.  If your screening test shows symptoms of depression (is positive), your health care provider may ask  you to see a mental health provider.  Anyone who is age 12 or older should be screened for depression. This information is not intended to replace advice given to you by your health care provider. Make sure you discuss any questions you have with your health care provider. Document Released: 12/04/2016 Document Revised: 12/04/2016 Document Reviewed: 12/04/2016 Elsevier Interactive Patient Education  2018 Elsevier Inc.  

## 2017-10-12 ENCOUNTER — Telehealth: Payer: Self-pay | Admitting: *Deleted

## 2017-10-12 NOTE — Telephone Encounter (Signed)
Pt called to office stating she needs refill on her BP meds and would like something for pain since returning to work. Spoke with pt, pt states that provider wanted her to continue on BP meds until next appt. Pt made aware provider may refill Ibuprofen on med list but may not send anything stronger at this point.  Pt made aware message to provider for refills. Pt uses [facility] on [address]  Please send if approved.

## 2017-10-13 ENCOUNTER — Other Ambulatory Visit: Payer: Self-pay | Admitting: Certified Nurse Midwife

## 2017-10-13 MED ORDER — IBUPROFEN 800 MG PO TABS: 800.0000 mg | ORAL_TABLET | Freq: Three times a day (TID) | ORAL | 1 refills | Status: DC | PRN

## 2017-10-13 NOTE — Telephone Encounter (Signed)
Motrin 800 mg sent to the pharmacy with 1 refill.  She needs to seek out primary care for blood pressure management. Thank you. Voncile Schwarz

## 2017-12-28 ENCOUNTER — Ambulatory Visit: Payer: Self-pay

## 2017-12-29 ENCOUNTER — Telehealth: Payer: Self-pay | Admitting: *Deleted

## 2017-12-29 NOTE — Telephone Encounter (Signed)
Attempted to call patient to reschedule missed DEPO injection on 12/28/2017 no answer recording stated person unable to receive calls at this time.Sonya KitchenMarland Frazier

## 2018-01-20 ENCOUNTER — Ambulatory Visit (INDEPENDENT_AMBULATORY_CARE_PROVIDER_SITE_OTHER): Payer: Self-pay

## 2018-01-20 DIAGNOSIS — Z3042 Encounter for surveillance of injectable contraceptive: Secondary | ICD-10-CM

## 2018-01-20 DIAGNOSIS — Z3202 Encounter for pregnancy test, result negative: Secondary | ICD-10-CM

## 2018-01-20 LAB — POCT URINE PREGNANCY: Preg Test, Ur: NEGATIVE

## 2018-01-20 MED ORDER — MEDROXYPROGESTERONE ACETATE 150 MG/ML IM SUSP: 150.0000 mg | INTRAMUSCULAR | 3 refills | Status: DC

## 2018-01-20 NOTE — Progress Notes (Signed)
Pt didn't know she needed to be seen after leaving urine sample so she checked out w/o being seen. Attempted to call pt but her phone is disconnected. UPT neg. Pt made aware during checkout to rto in 2 wks for 2nd UPT and bring Depo.

## 2018-02-02 ENCOUNTER — Ambulatory Visit (INDEPENDENT_AMBULATORY_CARE_PROVIDER_SITE_OTHER): Payer: Medicaid Other

## 2018-02-02 VITALS — BP 134/88 | HR 91 | Wt 156.4 lb

## 2018-02-02 DIAGNOSIS — Z3202 Encounter for pregnancy test, result negative: Secondary | ICD-10-CM | POA: Diagnosis not present

## 2018-02-02 DIAGNOSIS — Z3042 Encounter for surveillance of injectable contraceptive: Secondary | ICD-10-CM

## 2018-02-02 LAB — POCT URINE PREGNANCY: PREG TEST UR: NEGATIVE

## 2018-02-02 MED ORDER — MEDROXYPROGESTERONE ACETATE 150 MG/ML IM SUSP
150.0000 mg | Freq: Once | INTRAMUSCULAR | Status: AC
Start: 2018-02-02 — End: 2018-02-02
  Administered 2018-02-02: 150 mg via INTRAMUSCULAR

## 2018-02-02 NOTE — Progress Notes (Signed)
Pt is here for depo, missed last injection. UPT done today is negative. Depo inj given in L arm. Pt tolerated well.

## 2018-04-26 ENCOUNTER — Ambulatory Visit: Payer: Medicaid Other

## 2018-09-04 IMAGING — US US OB COMP LESS 14 WK
1 series · 15 of 28 positions shown · non-contrast
Comparison: 08/21/2016

CLINICAL DATA: 20-year-old female with 1 day of lower abdominal,
pelvic pain. Quantitative beta HCG pending. Gestational age by LMP 4
weeks 6 days.

EXAM:
OBSTETRIC <14 WK US AND TRANSVAGINAL OB US
TECHNIQUE: Both transabdominal and transvaginal ultrasound examinations were
performed for complete evaluation of the gestation as well as the
maternal uterus, adnexal regions, and pelvic cul-de-sac.
Transvaginal technique was performed to assess early pregnancy.

[Series 1: us ob comp less 14 wk · 15 of 46 slices shown]
[im 1/46]
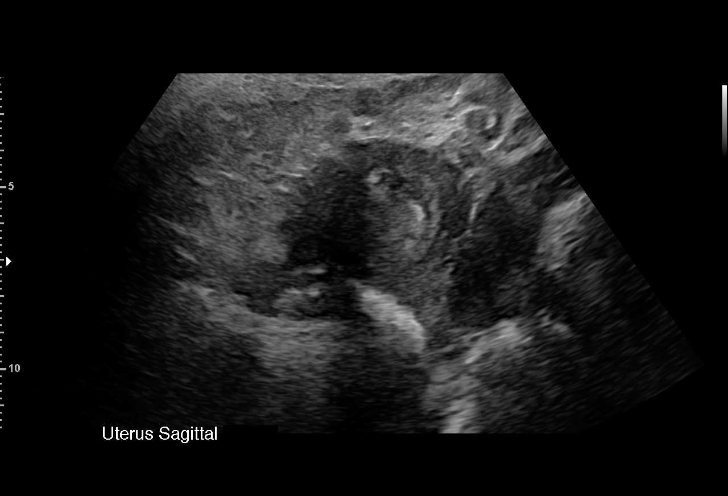
[im 4/46]
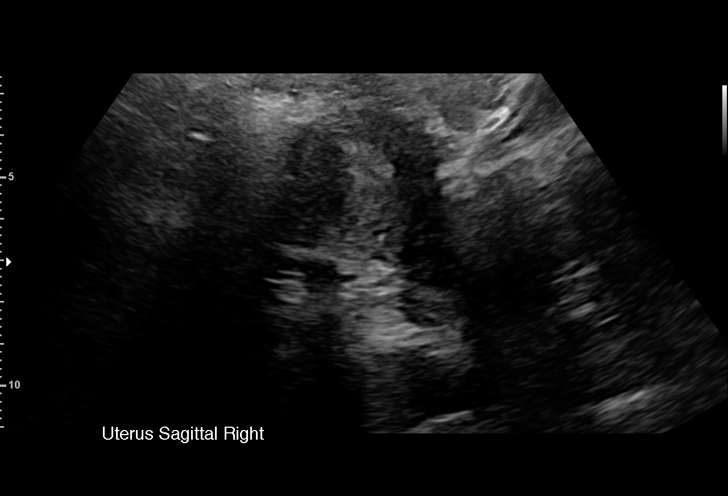
[im 7/46]
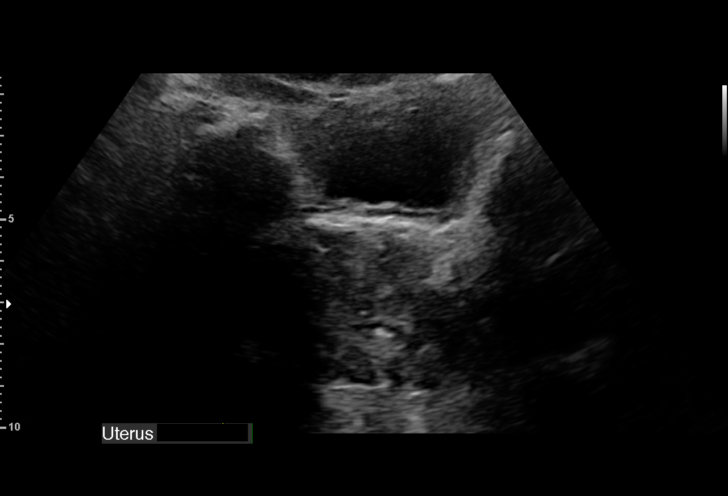
[im 11/46]
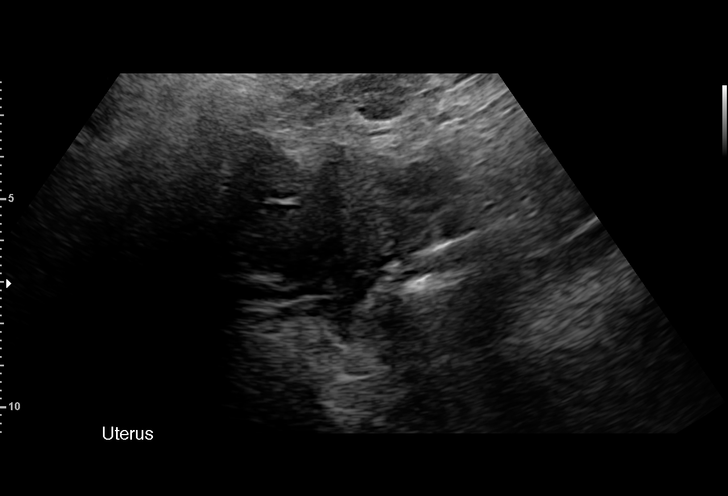
[im 14/46]
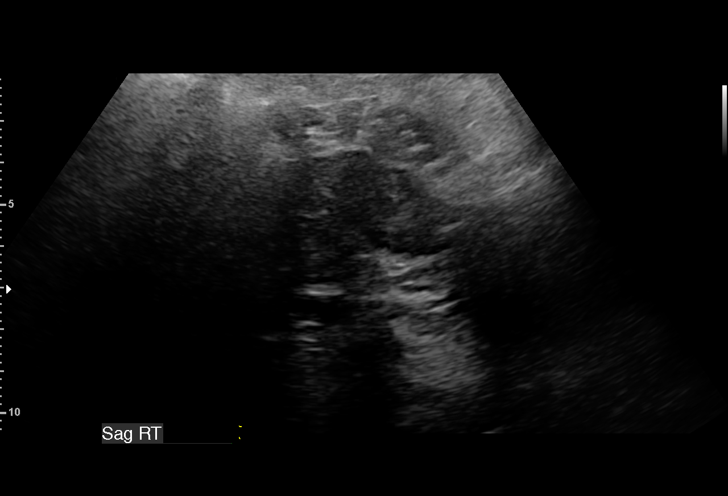
[im 17/46]
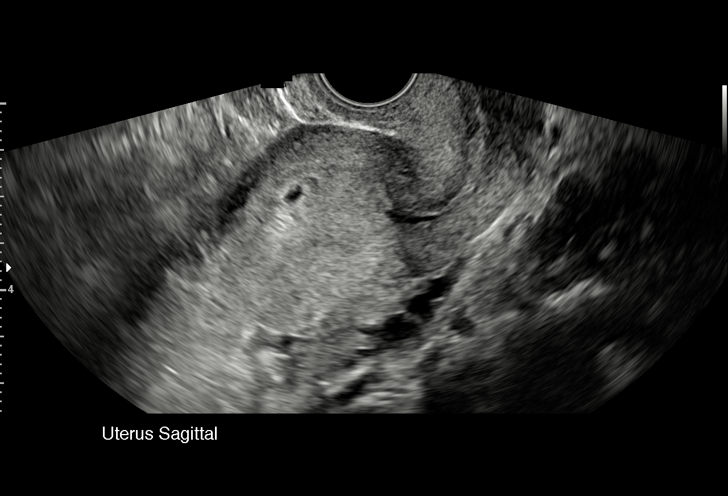
[im 21/46]
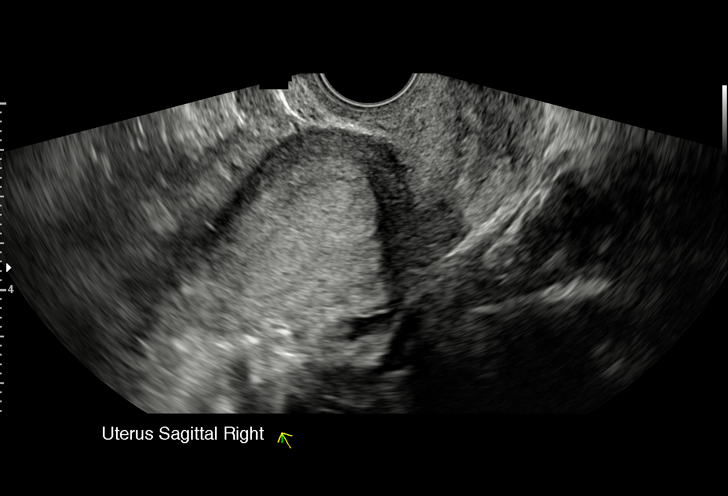
[im 24/46]
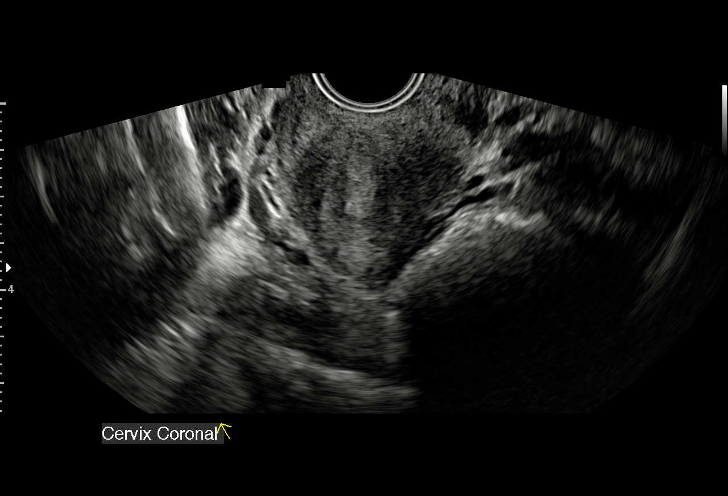
[im 26/46]
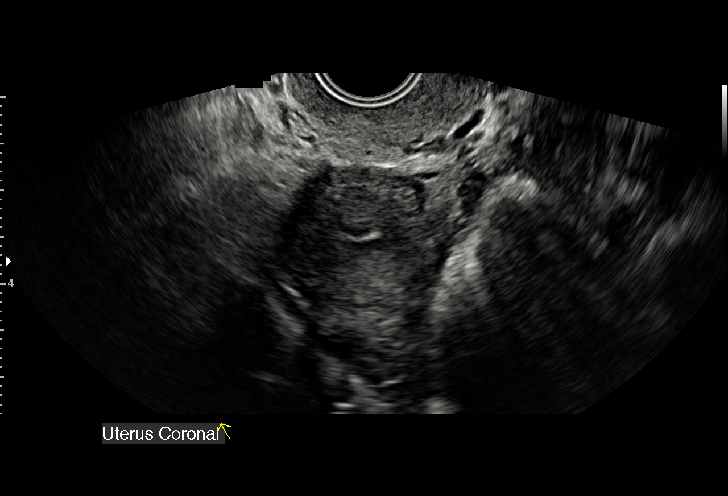
[im 29/46]
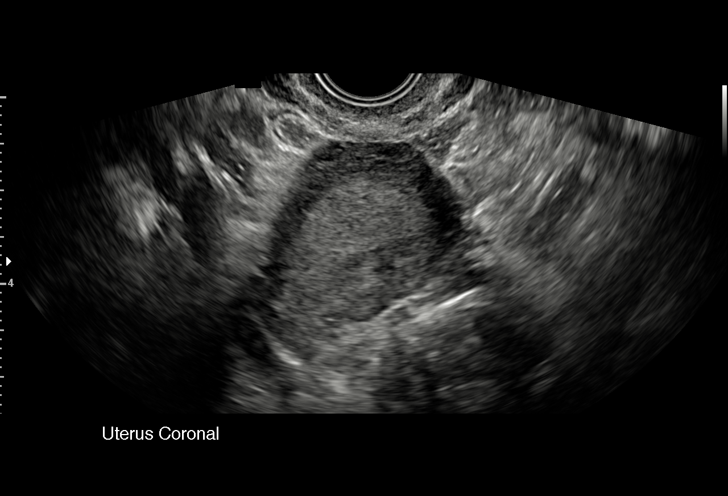
[im 32/46]
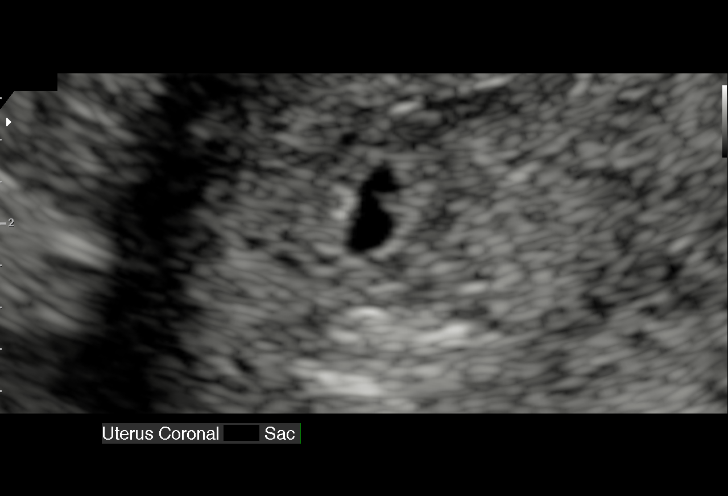
[im 36/46]
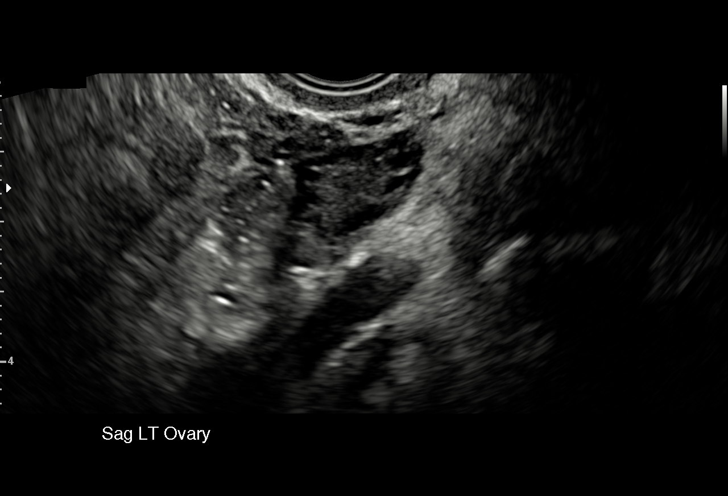
[im 39/46]
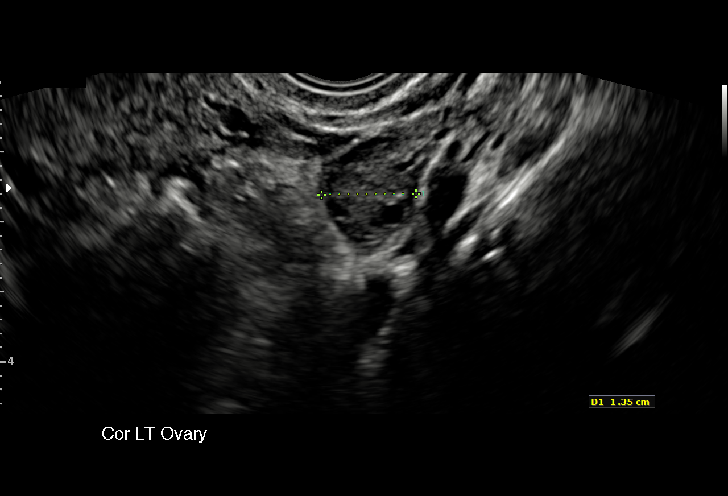
[im 42/46]
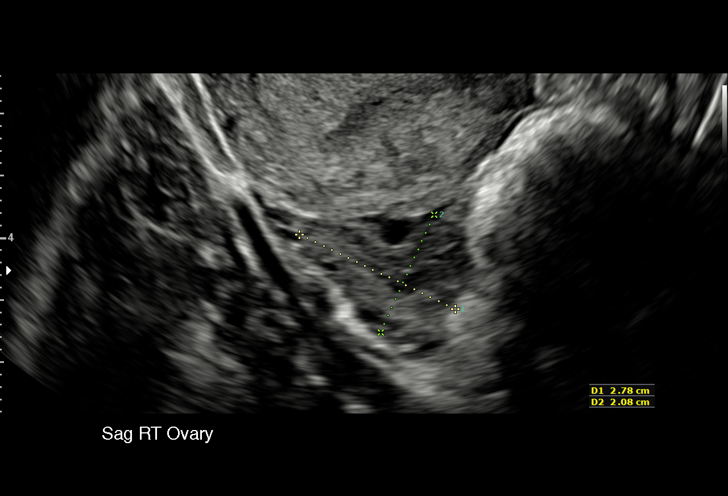
[im 46/46]
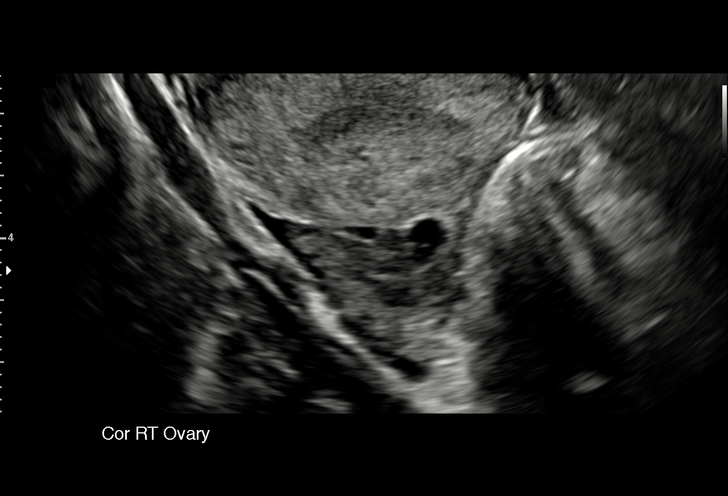

[15 of 28 positions shown; findings below may reference images not displayed]

FINDINGS: Intrauterine gestational sac: Small probable gestational sac at the
uterine fundus (image 20).

Yolk sac:  Questionably identified on image 35.

Embryo:  Not evident

Cardiac Activity: Not applicable

MSD: 3.5  mm   5 w   0  d

Subchorionic hemorrhage:  None visualized.

Maternal uterus/adnexae: The left ovary is normal measuring 1.4 x
2.3 x 1.6 cm. The right ovary is normal measuring 2.8 x 2.1 x
cm. No pelvic free fluid.
IMPRESSION: Probable early intrauterine gestational sac, but no definite yolk
sac, and no fetal pole, or cardiac activity yet visualized.

Recommend follow-up quantitative B-HCG levels and follow-up US in 14
days to assess viability.

This recommendation follows SRU consensus guidelines: Diagnostic
Criteria for Nonviable Pregnancy Early in the First Trimester. N
Engl J Med 7090; [DATE].

Ovaries appear normal.  No pelvic free fluid.

## 2018-10-18 ENCOUNTER — Other Ambulatory Visit: Payer: Self-pay

## 2018-10-18 ENCOUNTER — Encounter (HOSPITAL_COMMUNITY): Payer: Self-pay | Admitting: *Deleted

## 2018-10-18 ENCOUNTER — Emergency Department (HOSPITAL_COMMUNITY): Payer: Medicaid Other

## 2018-10-18 ENCOUNTER — Emergency Department (HOSPITAL_COMMUNITY)
Admission: EM | Admit: 2018-10-18 | Discharge: 2018-10-19 | Disposition: A | Discharge status: Home / Self Care | Payer: Medicaid Other | Attending: Emergency Medicine | Admitting: Emergency Medicine

## 2018-10-18 DIAGNOSIS — R072 Precordial pain: Secondary | ICD-10-CM | POA: Diagnosis not present

## 2018-10-18 DIAGNOSIS — Z87891 Personal history of nicotine dependence: Secondary | ICD-10-CM | POA: Insufficient documentation

## 2018-10-18 LAB — CBC
HCT: 39 % (ref 36.0–46.0)
Hemoglobin: 12.4 g/dL (ref 12.0–15.0)
MCH: 28.6 pg (ref 26.0–34.0)
MCHC: 31.8 g/dL (ref 30.0–36.0)
MCV: 89.9 fL (ref 80.0–100.0)
NRBC: 0 % (ref 0.0–0.2)
Platelets: 329 10*3/uL (ref 150–400)
RBC: 4.34 MIL/uL (ref 3.87–5.11)
RDW: 12.2 % (ref 11.5–15.5)
WBC: 12.9 10*3/uL — ABNORMAL HIGH (ref 4.0–10.5)

## 2018-10-18 MED ORDER — ALUM & MAG HYDROXIDE-SIMETH 200-200-20 MG/5ML PO SUSP
30.0000 mL | Freq: Once | ORAL | Status: AC
  Administered 2018-10-19: 30 mL via ORAL
  Filled 2018-10-18: qty 30

## 2018-10-18 MED ORDER — LIDOCAINE VISCOUS HCL 2 % MT SOLN
15.0000 mL | Freq: Once | OROMUCOSAL | Status: AC
  Administered 2018-10-19: 15 mL via ORAL
  Filled 2018-10-18: qty 15

## 2018-10-18 MED ORDER — SODIUM CHLORIDE 0.9% FLUSH: 3.0000 mL | Freq: Once | INTRAVENOUS | Status: DC

## 2018-10-18 NOTE — ED Triage Notes (Signed)
Pt reports mid-cp with sob and L arm and leg weakness.  Sxs onset was 4 days ago.  She reports pain is aggravated with inspiration and with movement.  Nothing alleviates the pain, she has taken OTC pain meds without relief.  She describes the pain as tight and sharp.  Pain is non-radiating.

## 2018-10-19 ENCOUNTER — Encounter (HOSPITAL_COMMUNITY): Payer: Self-pay | Admitting: Emergency Medicine

## 2018-10-19 LAB — BASIC METABOLIC PANEL
Anion gap: 9 (ref 5–15)
BUN: 18 mg/dL (ref 6–20)
CO2: 21 mmol/L — ABNORMAL LOW (ref 22–32)
CREATININE: 0.82 mg/dL (ref 0.44–1.00)
Calcium: 9.3 mg/dL (ref 8.9–10.3)
Chloride: 107 mmol/L (ref 98–111)
GFR calc Af Amer: >60 mL/min (ref >60)
GFR calc non Af Amer: >60 mL/min (ref >60)
Glucose, Bld: 97 mg/dL (ref 70–99)
Potassium: 3.6 mmol/L (ref 3.5–5.1)
Sodium: 137 mmol/L (ref 135–145)

## 2018-10-19 LAB — POCT I-STAT TROPONIN I: Troponin i, poc: 0 ng/mL (ref 0.00–0.08)

## 2018-10-19 LAB — I-STAT BETA HCG BLOOD, ED (NOT ORDERABLE): I-stat hCG, quantitative: <5 m[IU]/mL (ref <5)

## 2018-10-19 LAB — D-DIMER, QUANTITATIVE: D-Dimer, Quant: 0.43 ug/mL-FEU (ref 0.00–0.50)

## 2018-10-19 MED ORDER — OMEPRAZOLE 20 MG PO CPDR: 20.0000 mg | DELAYED_RELEASE_CAPSULE | Freq: Every day | ORAL | 0 refills | Status: DC

## 2018-10-19 NOTE — ED Provider Notes (Signed)
[facility]-EMERGENCY DEPT Provider Note   CSN: [id number] Arrival date & time: 10/18/18  2252    History   Chief Complaint Chief Complaint  Patient presents with   Chest Pain   arm numbness    HPI Sonya Frazier is a 23 y.o. female.     The history is provided by the patient.  Chest Pain  Pain location:  Substernal area Pain quality: sharp   Pain radiates to:  Does not radiate Pain severity:  Moderate Onset quality:  Gradual Timing:  Constant Progression:  Unchanged Chronicity:  New Context: not breathing, not drug use, not eating, not intercourse, not lifting, not movement, not raising an arm, not at rest, not stress and not trauma   Relieved by:  Nothing Worsened by:  Nothing Ineffective treatments:  None tried Associated symptoms: no abdominal pain, no AICD problem, no altered mental status, no anorexia, no anxiety, no back pain, no claudication, no cough, no diaphoresis, no dizziness, no dysphagia, no fatigue, no fever, no headache, no heartburn, no lower extremity edema, no nausea, no near-syncope, no numbness, no orthopnea, no palpitations, no PND, no shortness of breath, no syncope, no vomiting and no weakness   Risk factors: no aortic disease, no Ehlers-Danlos syndrome and not obese   No DOE, no exertional symptoms.  No cough.  No f/c/r.  No travel.  No long car trips or plane trips.  Patient has been eating a lot of greasy spicy food.    Past Medical History:  Diagnosis Date   Anemia    Medical history non-contributory     Patient Active Problem List   Diagnosis Date Noted   S/P C-section 09/10/2017    Past Surgical History:  Procedure Laterality Date   CESAREAN SECTION N/A 09/10/2017   Procedure: CESAREAN SECTION;  Surgeon: Lazaro Arms, MD;  Location: [facility] BIRTHING SUITES;  Service: Obstetrics;  Laterality: N/A;   NO PAST SURGERIES     WISDOM TOOTH EXTRACTION       OB History    Gravida  2   Para  1   Term  1   Preterm  0   AB  1   Living  1     SAB  1   TAB  0   Ectopic  0   Multiple  0   Live Births  1            Home Medications    Prior to Admission medications   Medication Sig Start Date End Date Taking? Authorizing Provider  hydrochlorothiazide (HYDRODIURIL) 25 MG tablet Take 1 tablet (25 mg total) by mouth daily. Patient not taking: Reported on 10/18/2018 09/13/17   Lazaro Arms, MD  ibuprofen (ADVIL,MOTRIN) 800 MG tablet Take 1 tablet (800 mg total) by mouth every 8 (eight) hours as needed. Patient not taking: Reported on 10/18/2018 10/13/17   Orvilla Cornwall A, CNM  medroxyPROGESTERone (DEPO-PROVERA) 150 MG/ML injection Inject 1 mL (150 mg total) into the muscle every 3 (three) months. Patient not taking: Reported on 10/18/2018 01/20/18   Adam Phenix, MD  Prenat-FeCbn-FeAspGl-FA-Omega (OB COMPLETE PETITE) 35-5-1-200 MG CAPS Take 1 tablet by mouth daily. Patient not taking: Reported on 10/07/2017 02/09/17   Roe Coombs, CNM    Family History Family History  Problem Relation Age of Onset   Asthma Mother    Cancer Maternal Aunt     Social History Social History   Tobacco Use   Smoking status: Former Smoker  Smokeless tobacco: Never Used  Substance Use Topics   Alcohol use: No    Comment: occ   Drug use: No    Types: Marijuana    Comment: Quit approx 1 year ago     Allergies   Patient has no known allergies.   Review of Systems Review of Systems  Constitutional: Negative for diaphoresis, fatigue and fever.  HENT: Negative for congestion and trouble swallowing.   Respiratory: Negative for cough and shortness of breath.   Cardiovascular: Positive for chest pain. Negative for palpitations, orthopnea, claudication, leg swelling, syncope, PND and near-syncope.  Gastrointestinal: Negative for abdominal pain, anorexia, heartburn, nausea and vomiting.  Musculoskeletal: Negative for arthralgias and back pain.  Neurological: Negative for  dizziness, weakness, numbness and headaches.  All other systems reviewed and are negative.    Physical Exam Updated Vital Signs BP (!) 133/104 (BP Location: Left Arm)    Pulse 90    Temp 98.5 F (36.9 C) (Oral)    Resp 16    LMP 09/24/2018    SpO2 100%   Physical Exam Vitals signs and nursing note reviewed.  Constitutional:      General: She is not in acute distress.    Appearance: Normal appearance.  HENT:     Head: Normocephalic and atraumatic.     Nose: Nose normal.     Mouth/Throat:     Mouth: Mucous membranes are moist.     Pharynx: Oropharynx is clear.  Eyes:     Conjunctiva/sclera: Conjunctivae normal.     Pupils: Pupils are equal, round, and reactive to light.  Neck:     Musculoskeletal: Normal range of motion and neck supple.  Cardiovascular:     Rate and Rhythm: Normal rate and regular rhythm.     Pulses: Normal pulses.     Heart sounds: Normal heart sounds.  Pulmonary:     Effort: Pulmonary effort is normal. No respiratory distress.     Breath sounds: Normal breath sounds. No stridor. No wheezing, rhonchi or rales.  Chest:     Chest wall: No tenderness.  Abdominal:     General: Abdomen is flat. Bowel sounds are normal.     Tenderness: There is no abdominal tenderness. There is no guarding or rebound.  Musculoskeletal:        General: No swelling or tenderness.     Right lower leg: No edema.     Left lower leg: No edema.  Skin:    General: Skin is warm and dry.     Capillary Refill: Capillary refill takes less than 2 seconds.  Neurological:     General: No focal deficit present.     Mental Status: She is alert and oriented to person, place, and time.  Psychiatric:        Mood and Affect: Mood normal.        Behavior: Behavior normal.      ED Treatments / Results  Labs (all labs ordered are listed, but only abnormal results are displayed) Results for orders placed or performed during the hospital encounter of 10/18/18  Basic metabolic panel  Result  Value Ref Range   Sodium 137 135 - 145 mmol/L   Potassium 3.6 3.5 - 5.1 mmol/L   Chloride 107 98 - 111 mmol/L   CO2 21 (L) 22 - 32 mmol/L   Glucose, Bld 97 70 - 99 mg/dL   BUN 18 6 - 20 mg/dL   Creatinine, Ser 2.95 0.44 - 1.00 mg/dL   Calcium  9.3 8.9 - 10.3 mg/dL   GFR calc non Af Amer >60 >60 mL/min   GFR calc Af Amer >60 >60 mL/min   Anion gap 9 5 - 15  CBC  Result Value Ref Range   WBC 12.9 (H) 4.0 - 10.5 K/uL   RBC 4.34 3.87 - 5.11 MIL/uL   Hemoglobin 12.4 12.0 - 15.0 g/dL   HCT 38.8 87.5 - 79.7 %   MCV 89.9 80.0 - 100.0 fL   MCH 28.6 26.0 - 34.0 pg   MCHC 31.8 30.0 - 36.0 g/dL   RDW 28.2 06.0 - 15.6 %   Platelets 329 150 - 400 K/uL   nRBC 0.0 0.0 - 0.2 %  D-dimer, quantitative (not at [facility])  Result Value Ref Range   D-Dimer, Quant 0.43 0.00 - 0.50 ug/mL-FEU  I-Stat beta hCG blood, ED  Result Value Ref Range   I-stat hCG, quantitative <5.0 <5 mIU/mL   Comment 3          POCT i-Stat troponin I  Result Value Ref Range   Troponin i, poc 0.00 0.00 - 0.08 ng/mL   Comment 3           Dg Chest 2 View  Result Date: 10/18/2018 CLINICAL DATA:  Chest pain EXAM: CHEST - 2 VIEW COMPARISON:  None. FINDINGS: The heart size and mediastinal contours are within normal limits. Both lungs are clear. The visualized skeletal structures are unremarkable. IMPRESSION: No active cardiopulmonary disease. Electronically Signed   By: Jasmine Pang M.D.   On: 10/18/2018 23:17    EKG EKG Interpretation  Date/Time:  Tuesday October 18 2018 23:08:11 EDT Ventricular Rate:  84 PR Interval:    QRS Duration: 85 QT Interval:  375 QTC Calculation: 444 R Axis:   82 Text Interpretation:  Sinus rhythm Confirmed by Leslyn Monda ([id number]) on 10/18/2018 11:54:59 PM   Radiology Dg Chest 2 View  Result Date: 10/18/2018 CLINICAL DATA:  Chest pain EXAM: CHEST - 2 VIEW COMPARISON:  None. FINDINGS: The heart size and mediastinal contours are within normal limits. Both lungs are clear. The visualized skeletal  structures are unremarkable. IMPRESSION: No active cardiopulmonary disease. Electronically Signed   By: Jasmine Pang M.D.   On: 10/18/2018 23:17    Procedures Procedures (including critical care time)  Medications Ordered in ED Medications  sodium chloride flush (NS) 0.9 % injection 3 mL (has no administration in time range)  alum & mag hydroxide-simeth (MAALOX/MYLANTA) 200-200-20 MG/5ML suspension 30 mL (30 mLs Oral Given 10/19/18 0015)    And  lidocaine (XYLOCAINE) 2 % viscous mouth solution 15 mL (15 mLs Oral Given 10/19/18 0015)     Heart score is 0 very low risk for MACE.  I suspect GERD.  Ruled out for PE in the ED with negative ddimer in a very low risk patient.  Will start gerd friendly diet and omeprazole.    Final Clinical Impressions(s) / ED Diagnoses   Return for intractable cough, coughing up blood,fevers >100.4 unrelieved by medication, shortness of breath, intractable vomiting, chest pain, shortness of breath, weakness,numbness, changes in speech, facial asymmetry,abdominal pain, passing out,Inability to tolerate liquids or food, cough, altered mental status or any concerns. No signs of systemic illness or infection. The patient is nontoxic-appearing on exam and vital signs are within normal limits.   I have reviewed the triage vital signs and the nursing notes. Pertinent labs &imaging results that were available during my care of the patient were reviewed by me and considered in  my medical decision making (see chart for details).  After history, exam, and medical workup I feel the patient has been appropriately medically screened and is safe for discharge home. Pertinent diagnoses were discussed with the patient. Patient was given return precautions.      Marcea Rojek, MD 10/19/18 [id number]

## 2018-12-26 ENCOUNTER — Other Ambulatory Visit: Payer: Self-pay

## 2018-12-26 ENCOUNTER — Emergency Department (HOSPITAL_COMMUNITY)
Admission: EM | Admit: 2018-12-26 | Discharge: 2018-12-26 | Disposition: A | Discharge status: Home / Self Care | Payer: Medicaid Other | Attending: Emergency Medicine | Admitting: Emergency Medicine

## 2018-12-26 ENCOUNTER — Encounter (HOSPITAL_COMMUNITY): Payer: Self-pay

## 2018-12-26 DIAGNOSIS — Z79899 Other long term (current) drug therapy: Secondary | ICD-10-CM | POA: Insufficient documentation

## 2018-12-26 DIAGNOSIS — N644 Mastodynia: Secondary | ICD-10-CM | POA: Diagnosis not present

## 2018-12-26 DIAGNOSIS — R079 Chest pain, unspecified: Secondary | ICD-10-CM | POA: Diagnosis not present

## 2018-12-26 DIAGNOSIS — Z87891 Personal history of nicotine dependence: Secondary | ICD-10-CM | POA: Diagnosis not present

## 2018-12-26 MED ORDER — DICLOFENAC SODIUM 1 % TD GEL
2.0000 g | Freq: Once | TRANSDERMAL | Status: AC
  Administered 2018-12-26: 2 g via TOPICAL
  Filled 2018-12-26: qty 100

## 2018-12-26 NOTE — ED Notes (Signed)
Pharmacy notified of need for voltaren gel

## 2018-12-26 NOTE — Discharge Instructions (Addendum)
Use voltaren gel to help with pain. It is important to follow-up with a primary care doctor.  There is information for a clinic below.  You may also contact the [phone] number in the back of the paperwork to help set up primary care Return to the emergency room if you develop fevers, difficulty breathing, severe worsening chest pain, any new, worsening, concerning symptoms.

## 2018-12-26 NOTE — ED Notes (Signed)
Pt given voltaren gel to take home per EDP, instructions reviewed.

## 2018-12-26 NOTE — ED Triage Notes (Addendum)
Pt arrives from home via [facility] pt reports sudden right sided CP while laying down. Upon EMS arrival, pt was CP free. Pt did not receive any ASA or NTG. Pt alert and oriented. Pt denies fevers/cough/chills.   Dewain Penning (Mom): [phone]

## 2018-12-26 NOTE — ED Notes (Signed)
Pt endorses family has called her a lyft (taxi) home.

## 2018-12-26 NOTE — ED Notes (Signed)
Patient verbalizes understanding of discharge instructions. Opportunity for questioning and answers were provided. Armband removed by staff, pt discharged from ED. Follow up care reviewed. Pt awaiting arrival of voltaren, then pt to be discharged. Pt to call mother and notify of disposition.

## 2018-12-26 NOTE — ED Provider Notes (Signed)
[facility] EMERGENCY DEPARTMENT Provider Note   CSN: [id number] Arrival date & time: 12/26/18  1216    History   Chief Complaint Chief Complaint  Patient presents with   Chest Pain    HPI Sonya Frazier is a 23 y.o. female presenting for evaluation of right-sided breast pain.  Patient states she has been having intermittent pain in her chest for the past year.  She was seen in the ED 2 months ago, had a reassuring work-up at that time.  Patient states the pain occurs about 3 times a week.  Today, pain was sharp and worse than normal, located in her right breast.  It is often in that area.  Patient reports a family history of breast cancer, but has never had this evaluated.  She was given a liquid to swallow for pain, states she has been taking this without improvement of her symptoms.  She denies fevers, chills, sore throat, cough, shortness of breath, nausea, vomiting, abdominal pain, urinary symptoms, normal bowel movements.  Patient denies family history of heart problems.  She denies tobacco, alcohol, or drug use.  She denies erythema, warmth, redness of the breast.  She denies nipple discharge. Pt states sh has no medical problems, takes no medications daily.  She denies recent travel, surgeries, immobilization, history of cancer, history of previous DVT/PE, or hormone use.  Additional history obtained from chart review.  Reviewed lab work-up from ED visit 2 months ago.  At that time, patient had a negative dimer, negative x-ray, and a negative troponin.     HPI  Past Medical History:  Diagnosis Date   Anemia    Medical history non-contributory     Patient Active Problem List   Diagnosis Date Noted   S/P C-section 09/10/2017    Past Surgical History:  Procedure Laterality Date   CESAREAN SECTION N/A 09/10/2017   Procedure: CESAREAN SECTION;  Surgeon: Lazaro Arms, MD;  Location: [facility] BIRTHING SUITES;  Service: Obstetrics;  Laterality: N/A;   NO  PAST SURGERIES     WISDOM TOOTH EXTRACTION       OB History    Gravida  2   Para  1   Term  1   Preterm  0   AB  1   Living  1     SAB  1   TAB  0   Ectopic  0   Multiple  0   Live Births  1            Home Medications    Prior to Admission medications   Medication Sig Start Date End Date Taking? Authorizing Provider  hydrochlorothiazide (HYDRODIURIL) 25 MG tablet Take 1 tablet (25 mg total) by mouth daily. Patient not taking: Reported on 10/18/2018 09/13/17   Lazaro Arms, MD  ibuprofen (ADVIL,MOTRIN) 800 MG tablet Take 1 tablet (800 mg total) by mouth every 8 (eight) hours as needed. Patient not taking: Reported on 10/18/2018 10/13/17   Orvilla Cornwall A, CNM  medroxyPROGESTERone (DEPO-PROVERA) 150 MG/ML injection Inject 1 mL (150 mg total) into the muscle every 3 (three) months. Patient not taking: Reported on 10/18/2018 01/20/18   Adam Phenix, MD  omeprazole (PRILOSEC) 20 MG capsule Take 1 capsule (20 mg total) by mouth daily. 10/19/18   Palumbo, April, MD  Prenat-FeCbn-FeAspGl-FA-Omega (OB COMPLETE PETITE) 35-5-1-200 MG CAPS Take 1 tablet by mouth daily. Patient not taking: Reported on 10/07/2017 02/09/17   Roe Coombs, CNM    Family  History Family History  Problem Relation Age of Onset   Asthma Mother    Cancer Maternal Aunt     Social History Social History   Tobacco Use   Smoking status: Former Smoker   Smokeless tobacco: Never Used  Substance Use Topics   Alcohol use: No    Comment: occ   Drug use: No    Types: Marijuana    Comment: Quit approx 1 year ago     Allergies   Patient has no known allergies.   Review of Systems Review of Systems  Cardiovascular:       R sided breast pain  All other systems reviewed and are negative.    Physical Exam Updated Vital Signs BP 115/70    Pulse 83    Temp 98.4 F (36.9 C) (Oral)    Resp 15    Ht 5' (1.524 m)    Wt 72.6 kg    SpO2 100%    BMI 31.25 kg/m   Physical  Exam Vitals signs and nursing note reviewed.  Constitutional:      General: She is not in acute distress.    Appearance: She is well-developed.     Comments: Sitting comfortably in the bed in no acute distress  HENT:     Head: Normocephalic and atraumatic.  Eyes:     Extraocular Movements: Extraocular movements intact.     Conjunctiva/sclera: Conjunctivae normal.     Pupils: Pupils are equal, round, and reactive to light.  Neck:     Musculoskeletal: Normal range of motion and neck supple.  Cardiovascular:     Rate and Rhythm: Normal rate and regular rhythm.     Pulses: Normal pulses.  Pulmonary:     Effort: Pulmonary effort is normal. No respiratory distress.     Breath sounds: Normal breath sounds. No wheezing.     Comments: Speaking in full sentences.  Clear lung sounds in all fields. Chest:       Comments: Tenderness to palpation of the right breast at the 2 o'clock position.  No nodules, erythema, warmth, induration, or signs of infection.  No tenderness palpation elsewhere on the chest wall or around the breast. Abdominal:     General: There is no distension.     Palpations: Abdomen is soft. There is no mass.     Tenderness: There is no abdominal tenderness. There is no guarding or rebound.  Musculoskeletal: Normal range of motion.  Skin:    General: Skin is warm and dry.     Capillary Refill: Capillary refill takes less than 2 seconds.  Neurological:     Mental Status: She is alert and oriented to person, place, and time.      ED Treatments / Results  Labs (all labs ordered are listed, but only abnormal results are displayed) Labs Reviewed - No data to display  EKG EKG Interpretation  Date/Time:  Monday Dec 26 2018 12:27:19 EDT Ventricular Rate:  84 PR Interval:    QRS Duration: 101 QT Interval:  344 QTC Calculation: 407 R Axis:   65 Text Interpretation:  Sinus rhythm Nonspecific T wave abnormality Confirmed by Cathren Laine ([id number]) on 12/26/2018 12:43:10  PM   Radiology No results found.  Procedures Procedures (including critical care time)  Medications Ordered in ED Medications  diclofenac sodium (VOLTAREN) 1 % transdermal gel 2 g (has no administration in time range)     Initial Impression / Assessment and Plan / ED Course  I have reviewed the triage  vital signs and the nursing notes.  Pertinent labs & imaging results that were available during my care of the patient were reviewed by me and considered in my medical decision making (see chart for details).        Patient resenting for evaluation of intermittent pain for the past year.  Pain was worse today, located in her right breast.  Physical exam reassuring, pain is reproducible with palpation of the breast tissue.  No tenderness palpation elsewhere.  No signs of respiratory distress.  Low suspicion for ACS at this time.  Low suspicion for PE, especially considering patient's recent work-up with a negative dimer.  Doubt infection, as patient is without fever or infectious symptoms.  Discussed findings with patient.  Discussed possible breast tenderness due to fibrocystic changes versus hormones per [facility] pain.  Encouraged follow-up with a primary care doctor, resources given.  Discussed that at this time, we cannot test for breast cancer in the ED, but I did recommend follow-up.  EKG reviewed by myself and Dr. Denton LankSteinl.  Discussed with Dr. Denton LankSteinl, who is agreeable to discharge.  EKG showed T wave inversion , which were present at her previous visit 2 months ago.  In the setting of an atypical story with reproducible tenderness of the breast tissue, doubt ACS.  At this time, patient appears safe for discharge.  Return precautions given.  Patient states she understands agrees plan.   Final Clinical Impressions(s) / ED Diagnoses   Final diagnoses:  Breast pain, right    ED Discharge Orders    None       Alveria ApleyCaccavale, Chihiro Frey, PA-C 12/26/18 1341    Cathren LaineSteinl, Kevin, MD 12/27/18  1421

## 2018-12-26 NOTE — ED Notes (Signed)
ED Provider at bedside. 

## 2019-01-13 ENCOUNTER — Inpatient Hospital Stay (HOSPITAL_COMMUNITY): Payer: Medicaid Other

## 2019-01-13 ENCOUNTER — Other Ambulatory Visit: Payer: Self-pay

## 2019-01-13 ENCOUNTER — Ambulatory Visit (HOSPITAL_COMMUNITY)
Admission: AD | Admit: 2019-01-13 | Discharge: 2019-01-14 | Disposition: A | Discharge status: Home / Self Care | Payer: Medicaid Other | Attending: Obstetrics & Gynecology | Admitting: Obstetrics & Gynecology

## 2019-01-13 ENCOUNTER — Encounter (HOSPITAL_COMMUNITY): Payer: Self-pay | Admitting: *Deleted

## 2019-01-13 ENCOUNTER — Encounter (HOSPITAL_COMMUNITY)
Admission: AD | Disposition: A | Discharge status: Home / Self Care | Payer: Self-pay | Source: Home / Self Care | Attending: Obstetrics & Gynecology

## 2019-01-13 DIAGNOSIS — O161 Unspecified maternal hypertension, first trimester: Secondary | ICD-10-CM | POA: Insufficient documentation

## 2019-01-13 DIAGNOSIS — O98511 Other viral diseases complicating pregnancy, first trimester: Secondary | ICD-10-CM | POA: Insufficient documentation

## 2019-01-13 DIAGNOSIS — O00101 Right tubal pregnancy without intrauterine pregnancy: Secondary | ICD-10-CM | POA: Diagnosis not present

## 2019-01-13 DIAGNOSIS — O00109 Unspecified tubal pregnancy without intrauterine pregnancy: Secondary | ICD-10-CM | POA: Diagnosis present

## 2019-01-13 DIAGNOSIS — O26891 Other specified pregnancy related conditions, first trimester: Secondary | ICD-10-CM | POA: Diagnosis not present

## 2019-01-13 DIAGNOSIS — Z3A08 8 weeks gestation of pregnancy: Secondary | ICD-10-CM | POA: Diagnosis not present

## 2019-01-13 DIAGNOSIS — U071 COVID-19: Secondary | ICD-10-CM | POA: Diagnosis not present

## 2019-01-13 DIAGNOSIS — R1030 Lower abdominal pain, unspecified: Secondary | ICD-10-CM | POA: Diagnosis not present

## 2019-01-13 DIAGNOSIS — Z87891 Personal history of nicotine dependence: Secondary | ICD-10-CM | POA: Insufficient documentation

## 2019-01-13 DIAGNOSIS — O26899 Other specified pregnancy related conditions, unspecified trimester: Secondary | ICD-10-CM

## 2019-01-13 HISTORY — DX: Unspecified tubal pregnancy without intrauterine pregnancy: O00.109

## 2019-01-13 HISTORY — PX: LAPAROSCOPIC UNILATERAL SALPINGECTOMY: SHX5934

## 2019-01-13 HISTORY — DX: Unspecified pre-eclampsia, unspecified trimester: O14.90

## 2019-01-13 LAB — CBC
HCT: 36.6 % (ref 36.0–46.0)
Hemoglobin: 12.4 g/dL (ref 12.0–15.0)
MCH: 28.8 pg (ref 26.0–34.0)
MCHC: 33.9 g/dL (ref 30.0–36.0)
MCV: 85.1 fL (ref 80.0–100.0)
Platelets: 313 10*3/uL (ref 150–400)
RBC: 4.3 MIL/uL (ref 3.87–5.11)
RDW: 11.9 % (ref 11.5–15.5)
WBC: 9.5 10*3/uL (ref 4.0–10.5)
nRBC: 0 % (ref 0.0–0.2)

## 2019-01-13 LAB — URINALYSIS, ROUTINE W REFLEX MICROSCOPIC
Bilirubin Urine: NEGATIVE
Glucose, UA: NEGATIVE mg/dL
Hgb urine dipstick: NEGATIVE
Ketones, ur: NEGATIVE mg/dL
Nitrite: POSITIVE — AB
Protein, ur: NEGATIVE mg/dL
Specific Gravity, Urine: 1.021 (ref 1.005–1.030)
pH: 6 (ref 5.0–8.0)

## 2019-01-13 LAB — WET PREP, GENITAL
Sperm: NONE SEEN
Trich, Wet Prep: NONE SEEN
Yeast Wet Prep HPF POC: NONE SEEN

## 2019-01-13 LAB — SARS CORONAVIRUS 2: SARS Coronavirus 2: DETECTED — AB

## 2019-01-13 LAB — HCG, QUANTITATIVE, PREGNANCY: hCG, Beta Chain, Quant, S: 108995 m[IU]/mL — ABNORMAL HIGH (ref <5)

## 2019-01-13 LAB — TYPE AND SCREEN
ABO/RH(D): A POS
Antibody Screen: NEGATIVE

## 2019-01-13 LAB — POCT PREGNANCY, URINE: Preg Test, Ur: POSITIVE — AB

## 2019-01-13 SURGERY — SALPINGECTOMY, UNILATERAL, LAPAROSCOPIC
Anesthesia: General | Laterality: Right

## 2019-01-13 MED ORDER — ACETAMINOPHEN 500 MG PO TABS
1000.0000 mg | ORAL_TABLET | Freq: Once | ORAL | Status: AC
  Administered 2019-01-13: 1000 mg via ORAL
  Filled 2019-01-13: qty 2

## 2019-01-13 MED ORDER — PROPOFOL 10 MG/ML IV BOLUS
INTRAVENOUS | Status: AC
  Filled 2019-01-13: qty 20

## 2019-01-13 MED ORDER — MIDAZOLAM HCL 2 MG/2ML IJ SOLN
INTRAMUSCULAR | Status: AC
  Filled 2019-01-13: qty 2

## 2019-01-13 MED ORDER — FENTANYL CITRATE (PF) 250 MCG/5ML IJ SOLN
INTRAMUSCULAR | Status: AC
  Filled 2019-01-13: qty 5

## 2019-01-13 SURGICAL SUPPLY — 42 items
ADH SKN CLS APL DERMABOND .7 (GAUZE/BANDAGES/DRESSINGS)
BAG SPEC RTRVL LRG 6X4 10 (ENDOMECHANICALS) ×1
BARRIER ADHS 3X4 INTERCEED (GAUZE/BANDAGES/DRESSINGS) IMPLANT
BRR ADH 4X3 ABS CNTRL BYND (GAUZE/BANDAGES/DRESSINGS)
COVER MAYO STAND STRL (DRAPES) ×3 IMPLANT
COVER WAND RF STERILE (DRAPES) ×3 IMPLANT
DECANTER SPIKE VIAL GLASS SM (MISCELLANEOUS) ×3 IMPLANT
DERMABOND ADVANCED (GAUZE/BANDAGES/DRESSINGS)
DERMABOND ADVANCED .7 DNX12 (GAUZE/BANDAGES/DRESSINGS) IMPLANT
DRSG OPSITE POSTOP 3X4 (GAUZE/BANDAGES/DRESSINGS) ×2 IMPLANT
ELECT REM PT RETURN 9FT ADLT (ELECTROSURGICAL) ×3
ELECTRODE REM PT RTRN 9FT ADLT (ELECTROSURGICAL) ×1 IMPLANT
FILTER SMOKE EVAC LAPAROSHD (FILTER) IMPLANT
GLOVE BIOGEL PI IND STRL 7.0 (GLOVE) ×1 IMPLANT
GLOVE BIOGEL PI IND STRL 8 (GLOVE) ×1 IMPLANT
GLOVE BIOGEL PI INDICATOR 7.0 (GLOVE) ×2
GLOVE BIOGEL PI INDICATOR 8 (GLOVE) ×2
GLOVE ECLIPSE 8.0 STRL XLNG CF (GLOVE) ×3 IMPLANT
GOWN STRL REUS W/ TWL LRG LVL3 (GOWN DISPOSABLE) ×2 IMPLANT
GOWN STRL REUS W/TWL LRG LVL3 (GOWN DISPOSABLE) ×6
HIBICLENS CHG 4% 4OZ BTL (MISCELLANEOUS) ×3 IMPLANT
NDL INSUFFLATION 14GA 120MM (NEEDLE) ×1 IMPLANT
NEEDLE INSUFFLATION 14GA 120MM (NEEDLE) ×3 IMPLANT
PACK LAPAROSCOPY BASIN (CUSTOM PROCEDURE TRAY) ×3 IMPLANT
PACK TRENDGUARD 450 HYBRID PRO (MISCELLANEOUS) IMPLANT
POUCH SPECIMEN RETRIEVAL 10MM (ENDOMECHANICALS) ×2 IMPLANT
PROTECTOR NERVE ULNAR (MISCELLANEOUS) ×6 IMPLANT
SET IRRIG TUBING LAPAROSCOPIC (IRRIGATION / IRRIGATOR) ×2 IMPLANT
SET TUBE SMOKE EVAC HIGH FLOW (TUBING) ×3 IMPLANT
SHEARS HARMONIC ACE PLUS 36CM (ENDOMECHANICALS) ×2 IMPLANT
SLEEVE ENDOPATH XCEL 5M (ENDOMECHANICALS) ×3 IMPLANT
STAPLER VISISTAT 35W (STAPLE) ×2 IMPLANT
SUT VIC AB 3-0 FS2 27 (SUTURE) ×2 IMPLANT
SUT VIC AB 3-0 SH 27 (SUTURE)
SUT VIC AB 3-0 SH 27X BRD (SUTURE) IMPLANT
SUT VICRYL 0 UR6 27IN ABS (SUTURE) IMPLANT
TOWEL GREEN STERILE FF (TOWEL DISPOSABLE) ×6 IMPLANT
TRAY FOLEY W/BAG SLVR 14FR (SET/KITS/TRAYS/PACK) ×3 IMPLANT
TRENDGUARD 450 HYBRID PRO PACK (MISCELLANEOUS) ×3
TROCAR BALLN 12MMX100 BLUNT (TROCAR) IMPLANT
TROCAR XCEL NON-BLD 11X100MML (ENDOMECHANICALS) ×3 IMPLANT
TROCAR XCEL NON-BLD 5MMX100MML (ENDOMECHANICALS) ×3 IMPLANT

## 2019-01-13 NOTE — MAU Provider Note (Addendum)
History     CSN: [id number]  Arrival date and time: 01/13/19 1925   First Provider Initiated Contact with Patient 01/13/19 2106      Chief Complaint  Patient presents with  . Possible Pregnancy  . Abdominal Pain  . Headache  . Dizziness   HPI  Ms.  Sonya Frazier is a 23 y.o. year old [id number] female at 774w3d weeks gestation who presents to MAU reporting (+) HPT, had spotting 1 time on 01/06/19, lower abdominal pain, H/A, dizziness, lightheaded, and some nausea. She denies any VB today. She state the abdominal pain is worse on the RT side. Last ate & drank at 1900. She had small sip of water for medication.  Past Medical History:  Diagnosis Date  . Anemia   . Medical history non-contributory   . Preeclampsia     Past Surgical History:  Procedure Laterality Date  . CESAREAN SECTION N/A 09/10/2017   Procedure: CESAREAN SECTION;  Surgeon: Lazaro ArmsEure, Luther H, MD;  [facility];  Service: Obstetrics;  Laterality: N/A;  . NO PAST SURGERIES    . WISDOM TOOTH EXTRACTION      Family History  Problem Relation Age of Onset  . Asthma Mother   . Cancer Maternal Aunt     Social History   Tobacco Use  . Smoking status: Former Games developermoker  . Smokeless tobacco: Never Used  Substance Use Topics  . Alcohol use: No    Comment: occ  . Drug use: No    Types: Marijuana    Comment: Quit approx 1 year ago    Allergies: No Known Allergies  Medications Prior to Admission  Medication Sig Dispense Refill Last Dose  . hydrochlorothiazide (HYDRODIURIL) 25 MG tablet Take 1 tablet (25 mg total) by mouth daily. (Patient not taking: Reported on 10/18/2018) 14 tablet 0   . ibuprofen (ADVIL,MOTRIN) 800 MG tablet Take 1 tablet (800 mg total) by mouth every 8 (eight) hours as needed. (Patient not taking: Reported on 10/18/2018) 60 tablet 1   . medroxyPROGESTERone (DEPO-PROVERA) 150 MG/ML injection Inject 1 mL (150 mg total) into the muscle every 3 (three) months. (Patient not taking:  Reported on 10/18/2018) 1 mL 3   . omeprazole (PRILOSEC) 20 MG capsule Take 1 capsule (20 mg total) by mouth daily. 30 capsule 0   . Prenat-FeCbn-FeAspGl-FA-Omega (OB COMPLETE PETITE) 35-5-1-200 MG CAPS Take 1 tablet by mouth daily. (Patient not taking: Reported on 10/07/2017) 30 capsule 12     Review of Systems  Constitutional: Negative.   HENT: Negative.   Eyes: Negative.   Respiratory: Negative.   Cardiovascular: Negative.   Gastrointestinal: Positive for abdominal pain and nausea.  Endocrine: Negative.   Genitourinary: Positive for pelvic pain (lower, R>L).  Musculoskeletal: Negative.   Skin: Negative.   Allergic/Immunologic: Negative.   Neurological: Positive for dizziness, weakness, light-headedness and headaches (rated 9/10).  Hematological: Negative.   Psychiatric/Behavioral: Negative.    Physical Exam   Blood pressure 127/70, pulse 91, temperature 98.5 F (36.9 C), resp. rate 18, height 5' (1.524 m), weight 73.9 kg, last menstrual period 11/22/2018.  Physical Exam  Nursing note and vitals reviewed. Constitutional: She is oriented to person, place, and time. She appears well-developed and well-nourished.  HENT:  Head: Normocephalic and atraumatic.  Eyes: Pupils are equal, round, and reactive to light.  Neck: Normal range of motion.  Cardiovascular: Normal rate.  Respiratory: Effort normal.  GI: Soft.  Genitourinary:    Genitourinary Comments: Uterus: non-tender, SE: cervix is smooth,  pink, no lesions, small amt of thin, white vaginal d/c -- WP, GC/CT done, closed/long/firm, no CMT or friability, moderate RT adnexal tenderness, mildly tender LT adnexa   Musculoskeletal: Normal range of motion.  Neurological: She is alert and oriented to person, place, and time.  Skin: Skin is warm and dry.  Psychiatric: She has a normal mood and affect. Her behavior is normal. Judgment and thought content normal.    MAU Course  Procedures  MDM CCUA UPT CBC ABO/Rh HCG Wet  Prep GC/CT -- pending HIV -- pending OB < 14 wks [place] with TV  *Consult with Dr. Elonda Husky @ 2147 - notified of patient's complaints, assessments, lab & U/S results -- prep for OR & get COVID-19 results prior to surgery  Results for orders placed or performed during the hospital encounter of 01/13/19 (from the past 24 hour(s))  Urinalysis, Routine w reflex microscopic     Status: Abnormal   Collection Time: 01/13/19  7:51 PM  Result Value Ref Range   Color, Urine YELLOW YELLOW   APPearance CLOUDY (A) CLEAR   Specific Gravity, Urine 1.021 1.005 - 1.030   pH 6.0 5.0 - 8.0   Glucose, UA NEGATIVE NEGATIVE mg/dL   Hgb urine dipstick NEGATIVE NEGATIVE   Bilirubin Urine NEGATIVE NEGATIVE   Ketones, ur NEGATIVE NEGATIVE mg/dL   Protein, ur NEGATIVE NEGATIVE mg/dL   Nitrite POSITIVE (A) NEGATIVE   Leukocytes,Ua TRACE (A) NEGATIVE   RBC / HPF 0-5 0 - 5 RBC/hpf   WBC, UA 11-20 0 - 5 WBC/hpf   Bacteria, UA MANY (A) NONE SEEN   Squamous Epithelial / LPF 0-5 0 - 5   Mucus PRESENT   Pregnancy, urine POC     Status: Abnormal   Collection Time: 01/13/19  7:52 PM  Result Value Ref Range   Preg Test, Ur POSITIVE (A) NEGATIVE  Wet prep, genital     Status: Abnormal   Collection Time: 01/13/19  8:48 PM  Result Value Ref Range   Yeast Wet Prep HPF POC NONE SEEN NONE SEEN   Trich, Wet Prep NONE SEEN NONE SEEN   Clue Cells Wet Prep HPF POC PRESENT (A) NONE SEEN   WBC, Wet Prep HPF POC MANY (A) NONE SEEN   Sperm NONE SEEN   CBC     Status: None   Collection Time: 01/13/19  9:03 PM  Result Value Ref Range   WBC 9.5 4.0 - 10.5 K/uL   RBC 4.30 3.87 - 5.11 MIL/uL   Hemoglobin 12.4 12.0 - 15.0 g/dL   HCT 36.6 36.0 - 46.0 %   MCV 85.1 80.0 - 100.0 fL   MCH 28.8 26.0 - 34.0 pg   MCHC 33.9 30.0 - 36.0 g/dL   RDW 11.9 11.5 - 15.5 %   Platelets 313 150 - 400 K/uL   nRBC 0.0 0.0 - 0.2 %    US Ob Comp Less 14 Wks  Result Date: 01/13/2019 CLINICAL DATA:  Lower abdominal pain EXAM: OBSTETRIC <14 WK  ULTRASOUND TECHNIQUE: Transabdominal ultrasound was performed for evaluation of the gestation as well as the maternal uterus and adnexal regions. COMPARISON:  07/19/2017 FINDINGS: Intrauterine gestational sac: None Yolk sac:  Visualized. Embryo:  Visualized. Cardiac Activity: Visualized. Heart Rate: 177 bpm CRL:   16.1 mm   8 w 0 d                  [place] EDC: Subchorionic hemorrhage:  None visualized. Maternal uterus/adnexae: The left ovary was not  visualized. There is an ectopic pregnancy in the right ovary as detailed above. The right ovary measures 4.5 x 2.9 x 3.1 cm. There is a trace amount of free fluid in the pelvis. IMPRESSION: 1. Findings consistent with the live ectopic pregnancy within the right ovary at 8 weeks and 0 days. 2. No intrauterine gestational sac identified on this exam. 3. Trace amount of free fluid in the pelvis. 4. Left ovary not visualized on this exam. Electronically Signed   By: Katherine Mantlehristopher  Green M.D.   On: 01/13/2019 21:40     Assessment and Plan  Right tubal pregnancy without intrauterine pregnancy   - Plan: Prep for OR for surgical removal of ectopic pregnancy - Dr. Despina HiddenEure will assume care of patient upon admission to OR  - See his documentation for consent and H&P   Raelyn Moraolitta Riese Hellard, MSN, CNM 01/13/2019, 9:06 PM

## 2019-01-13 NOTE — Anesthesia Preprocedure Evaluation (Signed)
Anesthesia Evaluation  Patient identified by MRN, date of birth, ID band Patient awake    Reviewed: Allergy & Precautions, H&P , NPO status , Patient's Chart, lab work & pertinent test results  Airway Mallampati: II  TM Distance: >3 FB Neck ROM: Full    Dental no notable dental hx. (+) Teeth Intact, Dental Advisory Given   Pulmonary neg pulmonary ROS, former smoker,    Pulmonary exam normal breath sounds clear to auscultation       Cardiovascular hypertension, Pt. on medications  Rhythm:Regular Rate:Normal     Neuro/Psych negative neurological ROS  negative psych ROS   GI/Hepatic negative GI ROS, Neg liver ROS,   Endo/Other  negative endocrine ROS  Renal/GU negative Renal ROS  negative genitourinary   Musculoskeletal   Abdominal   Peds  Hematology negative hematology ROS (+)   Anesthesia Other Findings   Reproductive/Obstetrics negative OB ROS                             Anesthesia Physical Anesthesia Plan  ASA: II and emergent  Anesthesia Plan: General   Post-op Pain Management:    Induction: Intravenous, Rapid sequence and Cricoid pressure planned  PONV Risk Score and Plan: 4 or greater and Ondansetron, Dexamethasone and Midazolam  Airway Management Planned: Oral ETT  Additional Equipment:   Intra-op Plan:   Post-operative Plan: Extubation in OR  Informed Consent: I have reviewed the patients History and Physical, chart, labs and discussed the procedure including the risks, benefits and alternatives for the proposed anesthesia with the patient or authorized representative who has indicated his/her understanding and acceptance.     Dental advisory given  Plan Discussed with: CRNA  Anesthesia Plan Comments:         Anesthesia Quick Evaluation

## 2019-01-13 NOTE — H&P (Addendum)
Preoperative History and Physical  Sonya Frazier is a 23 y.o. G3P1011 with Patient's last menstrual period was 11/22/2018. admitted for a laparoscopic right salpingectomy for the operative management of a large viable right ectopic pregnancy.  Pt had a positive pregnancy test earlier this week and began cramping today with some light spotting.  I have reviewed sonogram and there is a [redacted]w[redacted]d viable ectopic FHR 177 in the right adnexa, Fallopian tube  PMH:    Past Medical History:  Diagnosis Date  . Anemia   . Medical history non-contributory   . Preeclampsia     PSH:     Past Surgical History:  Procedure Laterality Date  . CESAREAN SECTION N/A 09/10/2017   Procedure: CESAREAN SECTION;  Surgeon: Florian Buff, MD;  Location: [facility];  Service: Obstetrics;  Laterality: N/A;  . NO PAST SURGERIES    . WISDOM TOOTH EXTRACTION      POb/GynH:      OB History    Gravida  3   Para  1   Term  1   Preterm  0   AB  1   Living  1     SAB  1   TAB  0   Ectopic  0   Multiple  0   Live Births  1           SH:   Social History   Tobacco Use  . Smoking status: Former Research scientist (life sciences)  . Smokeless tobacco: Never Used  Substance Use Topics  . Alcohol use: No    Comment: occ  . Drug use: No    Types: Marijuana    Comment: Quit approx 1 year ago    FH:    Family History  Problem Relation Age of Onset  . Asthma Mother   . Cancer Maternal Aunt      Allergies: No Known Allergies  Medications:      No current facility-administered medications for this encounter.   Review of Systems:   Review of Systems  Constitutional: Negative for fever, chills, weight loss, malaise/fatigue and diaphoresis.  HENT: Negative for hearing loss, ear pain, nosebleeds, congestion, sore throat, neck pain, tinnitus and ear discharge.   Eyes: Negative for blurred vision, double vision, photophobia, pain, discharge and redness.  Respiratory: Negative for cough, hemoptysis, sputum  production, shortness of breath, wheezing and stridor.   Cardiovascular: Negative for chest pain, palpitations, orthopnea, claudication, leg swelling and PND.  Gastrointestinal: Positive for abdominal pain. Negative for heartburn, nausea, vomiting, diarrhea, constipation, blood in stool and melena.  Genitourinary: Negative for dysuria, urgency, frequency, hematuria and flank pain.  Musculoskeletal: Negative for myalgias, back pain, joint pain and falls.  Skin: Negative for itching and rash.  Neurological: Negative for dizziness, tingling, tremors, sensory change, speech change, focal weakness, seizures, loss of consciousness, weakness and headaches.  Endo/Heme/Allergies: Negative for environmental allergies and polydipsia. Does not bruise/bleed easily.  Psychiatric/Behavioral: Negative for depression, suicidal ideas, hallucinations, memory loss and substance abuse. The patient is not nervous/anxious and does not have insomnia.      PHYSICAL EXAM:  Blood pressure 127/70, pulse 91, temperature 98.5 F (36.9 C), resp. rate 18, height 5' (1.524 m), weight 73.9 kg, last menstrual period 11/22/2018, unknown if currently breastfeeding.    Vitals reviewed. Constitutional: She is oriented to person, place, and time. She appears well-developed and well-nourished.  HENT:  Head: Normocephalic and atraumatic.  Right Ear: External ear normal.  Left Ear: External ear normal.  Nose: Nose  normal.  Mouth/Throat: Oropharynx is clear and moist.  Eyes: Conjunctivae and EOM are normal. Pupils are equal, round, and reactive to light. Right eye exhibits no discharge. Left eye exhibits no discharge. No scleral icterus.  Neck: Normal range of motion. Neck supple. No tracheal deviation present. No thyromegaly present.  Cardiovascular: Normal rate, regular rhythm, normal heart sounds and intact distal pulses.  Exam reveals no gallop and no friction rub.   No murmur heard. Respiratory: Effort normal and breath  sounds normal. No respiratory distress. She has no wheezes. She has no rales. She exhibits no tenderness.  GI: Soft. Bowel sounds are normal. She exhibits no distension and no mass. There is tenderness. There is no rebound and no guarding.  Genitourinary:       Vulva is normal without lesions Vagina is pink moist without discharge Cervix normal in appearance and pap is normal Uterus is normal size, contour, position, consistency, mobility, non-tender Adnexa is per sonogram Musculoskeletal: Normal range of motion. She exhibits no edema and no tenderness.  Neurological: She is alert and oriented to person, place, and time. She has normal reflexes. She displays normal reflexes. No cranial nerve deficit. She exhibits normal muscle tone. Coordination normal.  Skin: Skin is warm and dry. No rash noted. No erythema. No pallor.  Psychiatric: She has a normal mood and affect. Her behavior is normal. Judgment and thought content normal.    Labs: Results for orders placed or performed during the hospital encounter of 01/13/19 (from the past 336 hour(s))  Urinalysis, Routine w reflex microscopic   Collection Time: 01/13/19  7:51 PM  Result Value Ref Range   Color, Urine YELLOW YELLOW   APPearance CLOUDY (A) CLEAR   Specific Gravity, Urine 1.021 1.005 - 1.030   pH 6.0 5.0 - 8.0   Glucose, UA NEGATIVE NEGATIVE mg/dL   Hgb urine dipstick NEGATIVE NEGATIVE   Bilirubin Urine NEGATIVE NEGATIVE   Ketones, ur NEGATIVE NEGATIVE mg/dL   Protein, ur NEGATIVE NEGATIVE mg/dL   Nitrite POSITIVE (A) NEGATIVE   Leukocytes,Ua TRACE (A) NEGATIVE   RBC / HPF 0-5 0 - 5 RBC/hpf   WBC, UA 11-20 0 - 5 WBC/hpf   Bacteria, UA MANY (A) NONE SEEN   Squamous Epithelial / LPF 0-5 0 - 5   Mucus PRESENT   Pregnancy, urine POC   Collection Time: 01/13/19  7:52 PM  Result Value Ref Range   Preg Test, Ur POSITIVE (A) NEGATIVE  Wet prep, genital   Collection Time: 01/13/19  8:48 PM  Result Value Ref Range   Yeast Wet  Prep HPF POC NONE SEEN NONE SEEN   Trich, Wet Prep NONE SEEN NONE SEEN   Clue Cells Wet Prep HPF POC PRESENT (A) NONE SEEN   WBC, Wet Prep HPF POC MANY (A) NONE SEEN   Sperm NONE SEEN   CBC   Collection Time: 01/13/19  9:03 PM  Result Value Ref Range   WBC 9.5 4.0 - 10.5 K/uL   RBC 4.30 3.87 - 5.11 MIL/uL   Hemoglobin 12.4 12.0 - 15.0 g/dL   HCT 78.236.6 95.636.0 - 21.346.0 %   MCV 85.1 80.0 - 100.0 fL   MCH 28.8 26.0 - 34.0 pg   MCHC 33.9 30.0 - 36.0 g/dL   RDW 08.611.9 57.811.5 - 46.915.5 %   Platelets 313 150 - 400 K/uL   nRBC 0.0 0.0 - 0.2 %  hCG, quantitative, pregnancy   Collection Time: 01/13/19  9:03 PM  Result Value Ref Range  hCG, Beta Chain, Quant, S 108,995 (H) <5 mIU/mL    EKG: Orders placed or performed during the hospital encounter of 12/26/18  . EKG 12-Lead  . EKG 12-Lead  . EKG    Imaging Studies: [place] Ob Comp Less 14 Wks  Result Date: 01/13/2019 CLINICAL DATA:  Lower abdominal pain EXAM: OBSTETRIC <14 WK ULTRASOUND TECHNIQUE: Transabdominal ultrasound was performed for evaluation of the gestation as well as the maternal uterus and adnexal regions. COMPARISON:  07/19/2017 FINDINGS: Intrauterine gestational sac: None Yolk sac:  Visualized. Embryo:  Visualized. Cardiac Activity: Visualized. Heart Rate: 177 bpm CRL:   16.1 mm   8 w 0 d                  US EDC: Subchorionic hemorrhage:  None visualized. Maternal uterus/adnexae: The left ovary was not visualized. There is an ectopic pregnancy in the right ovary as detailed above. The right ovary measures 4.5 x 2.9 x 3.1 cm. There is a trace amount of free fluid in the pelvis. IMPRESSION: 1. Findings consistent with the live ectopic pregnancy within the right ovary at 8 weeks and 0 days. 2. No intrauterine gestational sac identified on this exam. 3. Trace amount of free fluid in the pelvis. 4. Left ovary not visualized on this exam. Electronically Signed   By: Katherine Mantlehristopher  Green M.D.   On: 01/13/2019 21:40      Assessment: Viable pregnancy  in the right Fallopian tube  Plan: Laparoscopic right salpingectomy for the management of a large viable ectopic pregnancy  Lazaro ArmsLuther H Nobie Alleyne 01/13/2019 10:37 PM

## 2019-01-13 NOTE — Progress Notes (Signed)
Spoke with maternity administrative coordinator Janalyn Rouse regarding patient's Covid-19 status.

## 2019-01-13 NOTE — MAU Note (Signed)
Positive home upt. Last Friday had some spotting but it stopped. This wk having lower abd pain, headache, dizziness and lightheaded. Some nausea.

## 2019-01-13 NOTE — MAU Note (Signed)
Covid swab obtained without difficulty. Pt tol well. No s/s covid

## 2019-01-14 ENCOUNTER — Other Ambulatory Visit: Payer: Self-pay | Admitting: Obstetrics & Gynecology

## 2019-01-14 ENCOUNTER — Inpatient Hospital Stay (HOSPITAL_COMMUNITY): Payer: Medicaid Other | Admitting: Anesthesiology

## 2019-01-14 DIAGNOSIS — O161 Unspecified maternal hypertension, first trimester: Secondary | ICD-10-CM | POA: Diagnosis not present

## 2019-01-14 DIAGNOSIS — O98511 Other viral diseases complicating pregnancy, first trimester: Secondary | ICD-10-CM | POA: Diagnosis not present

## 2019-01-14 DIAGNOSIS — Z3A08 8 weeks gestation of pregnancy: Secondary | ICD-10-CM | POA: Diagnosis not present

## 2019-01-14 DIAGNOSIS — U071 COVID-19: Secondary | ICD-10-CM | POA: Diagnosis not present

## 2019-01-14 DIAGNOSIS — I10 Essential (primary) hypertension: Secondary | ICD-10-CM | POA: Diagnosis not present

## 2019-01-14 DIAGNOSIS — O009 Unspecified ectopic pregnancy without intrauterine pregnancy: Secondary | ICD-10-CM | POA: Diagnosis not present

## 2019-01-14 DIAGNOSIS — O00101 Right tubal pregnancy without intrauterine pregnancy: Secondary | ICD-10-CM | POA: Diagnosis not present

## 2019-01-14 LAB — HIV ANTIBODY (ROUTINE TESTING W REFLEX): HIV Screen 4th Generation wRfx: NONREACTIVE

## 2019-01-14 LAB — ABO/RH: ABO/RH(D): A POS

## 2019-01-14 MED ORDER — SODIUM CHLORIDE 0.9 % IR SOLN
Status: DC | PRN
  Administered 2019-01-14: 1000 mL

## 2019-01-14 MED ORDER — PROPOFOL 10 MG/ML IV BOLUS
INTRAVENOUS | Status: DC | PRN
  Administered 2019-01-14: 30 mg via INTRAVENOUS
  Administered 2019-01-14: 130 mg via INTRAVENOUS
  Administered 2019-01-14: 40 mg via INTRAVENOUS

## 2019-01-14 MED ORDER — KETOROLAC TROMETHAMINE 30 MG/ML IJ SOLN
INTRAMUSCULAR | Status: DC | PRN
  Administered 2019-01-14: 30 mg via INTRAVENOUS

## 2019-01-14 MED ORDER — HYDROCODONE-ACETAMINOPHEN 5-325 MG PO TABS: 1.0000 | ORAL_TABLET | Freq: Four times a day (QID) | ORAL | 0 refills | Status: DC | PRN

## 2019-01-14 MED ORDER — OXYCODONE HCL 5 MG PO TABS
ORAL_TABLET | ORAL | Status: AC
  Filled 2019-01-14: qty 2

## 2019-01-14 MED ORDER — LIDOCAINE 2% (20 MG/ML) 5 ML SYRINGE
INTRAMUSCULAR | Status: DC | PRN
  Administered 2019-01-14: 60 mg via INTRAVENOUS

## 2019-01-14 MED ORDER — SUGAMMADEX SODIUM 200 MG/2ML IV SOLN
INTRAVENOUS | Status: DC | PRN
  Administered 2019-01-14: 200 mg via INTRAVENOUS

## 2019-01-14 MED ORDER — OXYCODONE HCL 5 MG PO TABS
5.0000 mg | ORAL_TABLET | Freq: Once | ORAL | Status: AC
  Administered 2019-01-14: 5 mg via ORAL

## 2019-01-14 MED ORDER — DEXAMETHASONE SODIUM PHOSPHATE 10 MG/ML IJ SOLN
INTRAMUSCULAR | Status: DC | PRN
  Administered 2019-01-14: 10 mg via INTRAVENOUS

## 2019-01-14 MED ORDER — HYDROMORPHONE HCL 1 MG/ML IJ SOLN
INTRAMUSCULAR | Status: AC
  Filled 2019-01-14: qty 0.5

## 2019-01-14 MED ORDER — ONDANSETRON HCL 4 MG/2ML IJ SOLN
INTRAMUSCULAR | Status: DC | PRN
  Administered 2019-01-14: 4 mg via INTRAVENOUS

## 2019-01-14 MED ORDER — ACETAMINOPHEN 10 MG/ML IV SOLN
INTRAVENOUS | Status: AC
  Filled 2019-01-14: qty 100

## 2019-01-14 MED ORDER — HYDROMORPHONE HCL 1 MG/ML IJ SOLN
INTRAMUSCULAR | Status: DC | PRN
  Administered 2019-01-14 (×2): 0.5 mg via INTRAVENOUS

## 2019-01-14 MED ORDER — LACTATED RINGERS IV SOLN
INTRAVENOUS | Status: DC | PRN
  Administered 2019-01-14 (×2): via INTRAVENOUS

## 2019-01-14 MED ORDER — ROCURONIUM BROMIDE 50 MG/5ML IV SOSY
PREFILLED_SYRINGE | INTRAVENOUS | Status: DC | PRN
  Administered 2019-01-14: 40 mg via INTRAVENOUS

## 2019-01-14 MED ORDER — ONDANSETRON 8 MG PO TBDP: 8.0000 mg | ORAL_TABLET | Freq: Three times a day (TID) | ORAL | 0 refills | Status: DC | PRN

## 2019-01-14 MED ORDER — ACETAMINOPHEN 10 MG/ML IV SOLN
INTRAVENOUS | Status: DC | PRN
  Administered 2019-01-14: 1000 mg via INTRAVENOUS

## 2019-01-14 MED ORDER — SUCCINYLCHOLINE CHLORIDE 200 MG/10ML IV SOSY
PREFILLED_SYRINGE | INTRAVENOUS | Status: DC | PRN
  Administered 2019-01-14: 100 mg via INTRAVENOUS

## 2019-01-14 MED ORDER — CEFAZOLIN SODIUM-DEXTROSE 2-3 GM-%(50ML) IV SOLR
INTRAVENOUS | Status: DC | PRN
  Administered 2019-01-14: 2 g via INTRAVENOUS

## 2019-01-14 MED ORDER — FENTANYL CITRATE (PF) 100 MCG/2ML IJ SOLN
INTRAMUSCULAR | Status: DC | PRN
  Administered 2019-01-14: 100 ug via INTRAVENOUS
  Administered 2019-01-14 (×3): 50 ug via INTRAVENOUS

## 2019-01-14 MED ORDER — KETOROLAC TROMETHAMINE 10 MG PO TABS: 10.0000 mg | ORAL_TABLET | Freq: Three times a day (TID) | ORAL | 0 refills | Status: DC | PRN

## 2019-01-14 NOTE — OR Nursing (Signed)
Discharge instructions given to patient, patient verbalized understanding. Assisted patient with dressing, patient able to stand with assistance. Patient wheeled out of OR by PACU staff.

## 2019-01-14 NOTE — OR Nursing (Signed)
Assisted CRNA in moving patient to wheelchair. Patient able to ambulate with assistance. Continuing to monitor.

## 2019-01-14 NOTE — Anesthesia Postprocedure Evaluation (Signed)
Anesthesia Post Note  Patient: Sonya Frazier  Procedure(s) Performed: LAPAROSCOPIC UNILATERAL SALPINGECTOMY (Right )     Patient location during evaluation: PACU Anesthesia Type: General Level of consciousness: awake and alert Pain management: pain level controlled Vital Signs Assessment: post-procedure vital signs reviewed and stable Respiratory status: spontaneous breathing, nonlabored ventilation and respiratory function stable Cardiovascular status: blood pressure returned to baseline and stable Postop Assessment: no apparent nausea or vomiting Anesthetic complications: no    Last Vitals:  Vitals:   01/13/19 1933 01/13/19 1935  BP:  127/70  Pulse:  91  Resp: 18   Temp: 36.9 C     Last Pain:  Vitals:   01/13/19 2104  PainSc: 9                  Isiaah Cuervo,W. EDMOND

## 2019-01-14 NOTE — Discharge Summary (Signed)
  Discharged from recovery room

## 2019-01-14 NOTE — Anesthesia Procedure Notes (Signed)
Procedure Name: Intubation Date/Time: 01/14/2019 12:52 AM Performed by: Babs Bertin, CRNA Pre-anesthesia Checklist: Patient identified, Emergency Drugs available, Suction available and Patient being monitored Patient Re-evaluated:Patient Re-evaluated prior to induction Oxygen Delivery Method: Circle System Utilized Preoxygenation: Pre-oxygenation with 100% oxygen Induction Type: IV induction and Rapid sequence Laryngoscope Size: Mac and 3 Grade View: Grade I Tube type: Oral Tube size: 7.0 mm Number of attempts: 1 Airway Equipment and Method: Stylet and Oral airway Placement Confirmation: ETT inserted through vocal cords under direct vision,  positive ETCO2 and breath sounds checked- equal and bilateral Secured at: 21 cm Tube secured with: Tape Dental Injury: Teeth and Oropharynx as per pre-operative assessment

## 2019-01-14 NOTE — Transfer of Care (Signed)
Immediate Anesthesia Transfer of Care Note  Patient: Sonya Frazier  Procedure(s) Performed: LAPAROSCOPIC UNILATERAL SALPINGECTOMY (Right )  Patient Location: PACU and PACU RN at bedside in operating room  Anesthesia Type:General  Level of Consciousness: awake, alert  and oriented  Airway & Oxygen Therapy: Patient Spontanous Breathing  Post-op Assessment: Report given to RN, Post -op Vital signs reviewed and stable and pain 7/10  Post vital signs: Reviewed and stable  Last Vitals:  Vitals Value Taken Time  BP    Temp    Pulse    Resp    SpO2      Last Pain:  Vitals:   01/13/19 2104  PainSc: 9       Patients Stated Pain Goal: 0 (84/13/24 4010)  Complications: No apparent anesthesia complications

## 2019-01-14 NOTE — Op Note (Signed)
Preoperative diagnosis: 1.  Viable ectopic pregnancy right fallopian tube, 8                                                     weeks and 3 days                                         2.  COVID positive asymptomatic  Postoperative diagnosis: Same as above plus intact ectopic pregnancy  Procedure: Laparoscopic right salpingectomy for the operative management of a right ectopic pregnancy  Surgeon: Chriss Driver, MD  Anesthesia: General endotracheal  Findings:Unruptured right ectopic pregnancy which involved the entire tube.  It was a viable [redacted]w[redacted]d pregnancy with FHR 177  Description of operation: Patient was taken to the operating room where she was placed in supine position underwent general tracheal anesthesia  She was placed in low lithotomy position and prepped and draped in usual sterile fashion for laparoscopic procedure Incision was made in the umbilicus and the umbilical fascia was grasped with a Coker clamp A varies needle was then placed into the peritoneal cavity with 1 pass that difficulty The peritoneal cavity was insufflated An 11 mm non-bladed video laparoscope trocar was used and placed into the peritoneal cavity 1 past light difficulty Incisions were then made in the right and left lower quadrants bilaterally Under direct visualization 5 mm trochars were placed bilaterally The large ectopic pregnancy was identified and was unruptured there was no blood in the belly at all Harmonic scalpel was used and a salpingectomy was performed to remove the ectopic pregnancy as it involve the entire length of the fallopian tube The left tube and both ovaries were otherwise found to be normal and there were no other peritoneal abnormalities noted The ectopic pregnancy was removed from the peritoneal cavity The umbilical fascia was closed with 0 Vicryl All 3 incisions were closed using 3-0 Vicryl subcuticular fashion However there was some oozing from all 3 sites and so staples were also  used The patient was awakened from anesthesia and taken recovery in good stable condition all counts correct x3 She received Ancef and Toradol preoperatively prophylactically There was 0 blood loss for the procedure She will be seen in the office in 1 week for postoperative visit  Florian Buff, MD 01/14/2019 2:17 AM

## 2019-01-14 NOTE — Progress Notes (Addendum)
Patient rating pain 7/10 despite numerous pain medication administrations (see MAR).  VSS:  HR 77, BP 112/78, sO2 100% on RA, RR 12-15.  Dr. Elonda Husky made aware.  Patient neuro intact, A&O x 4.  Outpatient pain medication ordered and prescription sent to [facility] pharmacy.  Dr. Ola Spurr also notified and order received to administer 5mg  oxycodone before discharge.  Dr. Elonda Husky okay to discharge patient.

## 2019-01-16 ENCOUNTER — Encounter (HOSPITAL_COMMUNITY): Payer: Self-pay | Admitting: Obstetrics & Gynecology

## 2019-01-16 LAB — CULTURE, OB URINE
Culture: >=100000 — AB
Special Requests: NORMAL

## 2019-01-16 LAB — GC/CHLAMYDIA PROBE AMP (~~LOC~~) NOT AT ARMC
Chlamydia: NEGATIVE
Neisseria Gonorrhea: NEGATIVE

## 2019-01-17 ENCOUNTER — Other Ambulatory Visit: Payer: Self-pay | Admitting: Advanced Practice Midwife

## 2019-01-17 ENCOUNTER — Telehealth: Payer: Self-pay | Admitting: Obstetrics & Gynecology

## 2019-01-17 MED ORDER — SULFAMETHOXAZOLE-TRIMETHOPRIM 800-160 MG PO TABS: 1.0000 | ORAL_TABLET | Freq: Two times a day (BID) | ORAL | 0 refills | Status: DC

## 2019-01-17 NOTE — Progress Notes (Signed)
Patient with UTI on urine culture. RX sent to pharmacy on file. Patient called with results.   Marcille Buffy DNP, CNM  01/17/19  8:07 AM

## 2019-01-19 ENCOUNTER — Encounter: Payer: Self-pay | Admitting: Medical

## 2019-01-19 DIAGNOSIS — U071 COVID-19: Secondary | ICD-10-CM | POA: Insufficient documentation

## 2019-01-19 HISTORY — DX: COVID-19: U07.1

## 2019-01-19 IMAGING — US US MFM OB FOLLOW-UP
1 series · 13 of 28 positions shown · non-contrast
Comparison: none

[Series 1: us mfm ob follow-up · 57 acquisitions, 13 frames shown]
[im 3/57]
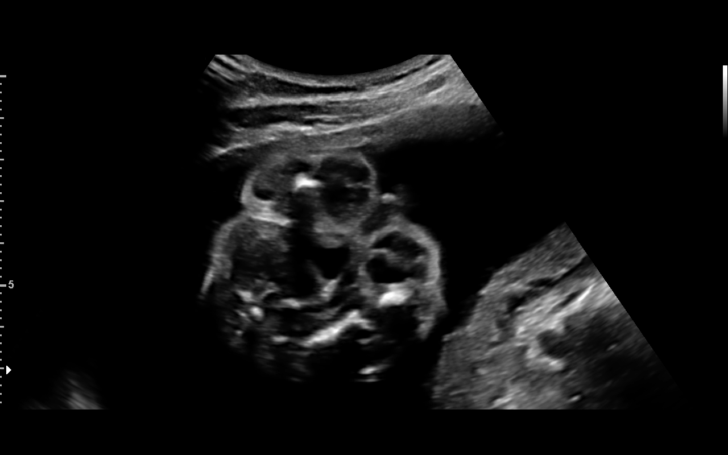
[im 7/57]
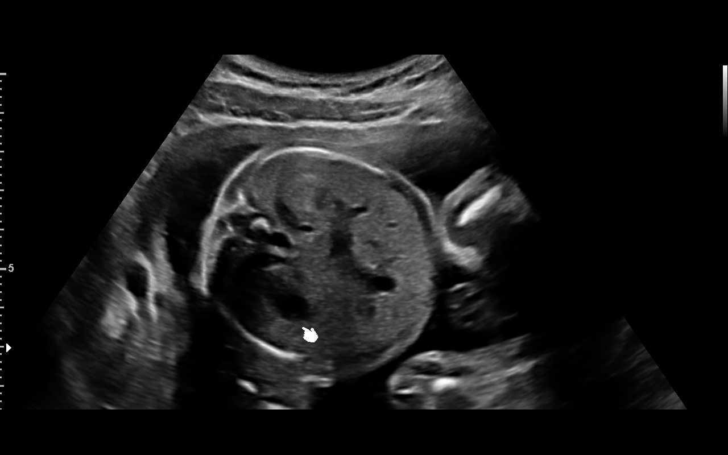
[im 11/57]
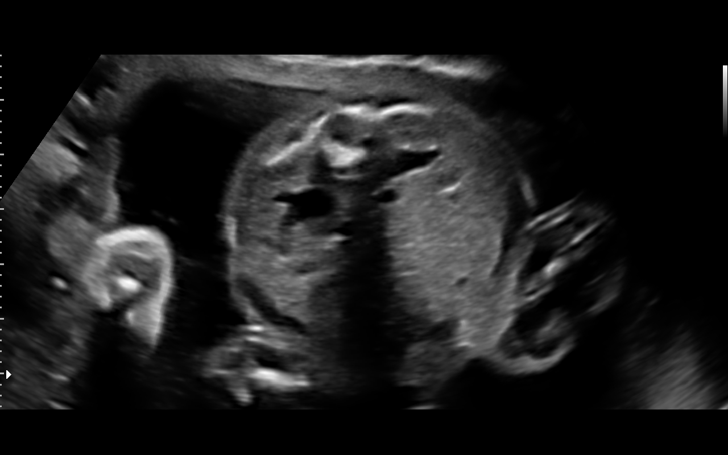
[im 15/57]
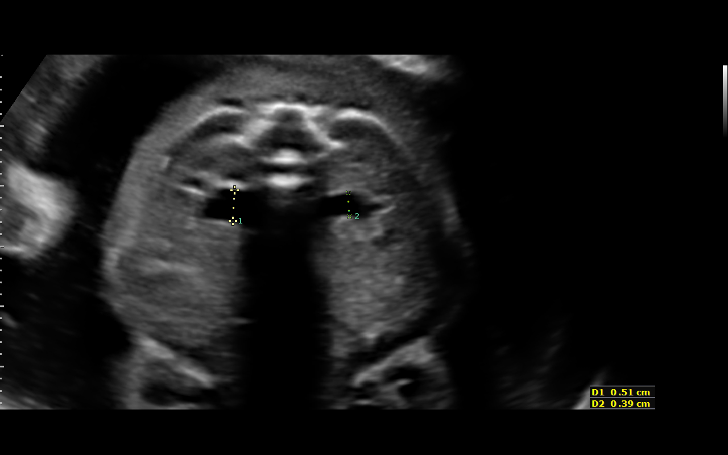
[im 19/57]
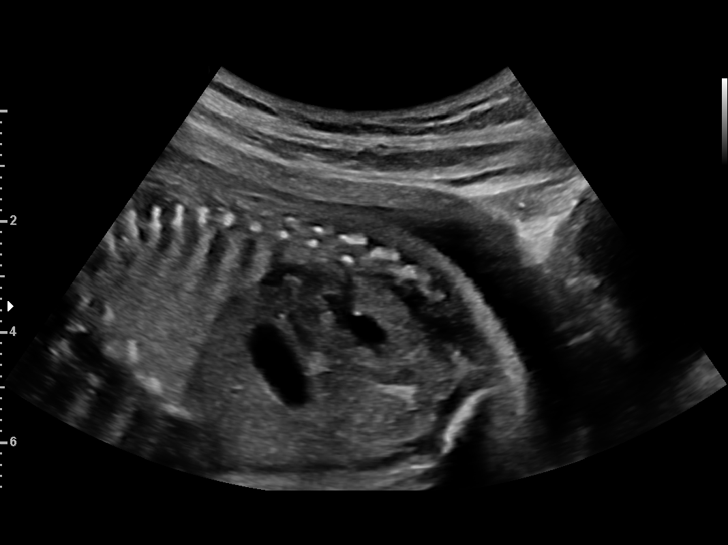
[im 23/57]
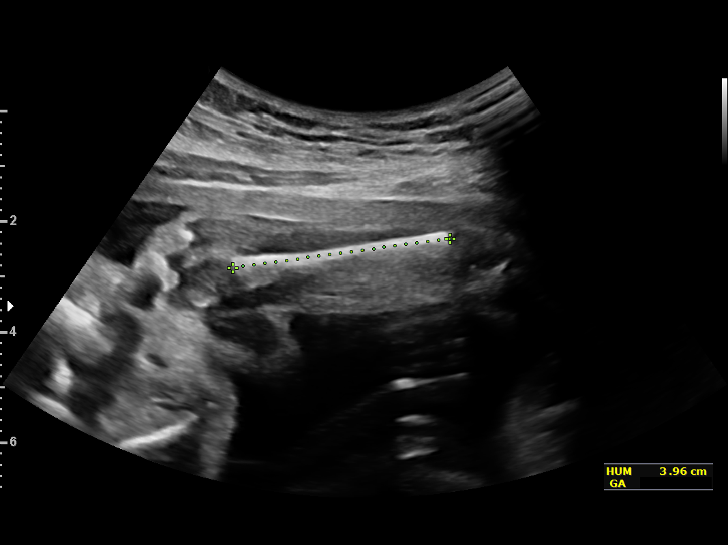
[im 30/57]
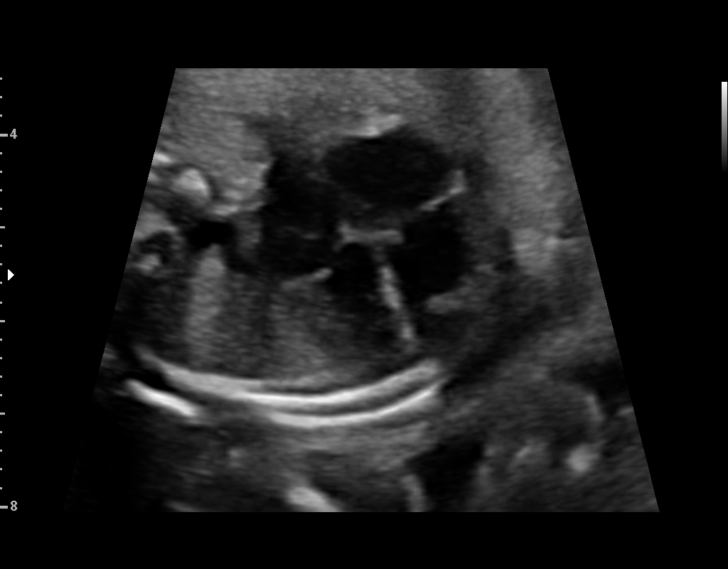
[im 34/57]
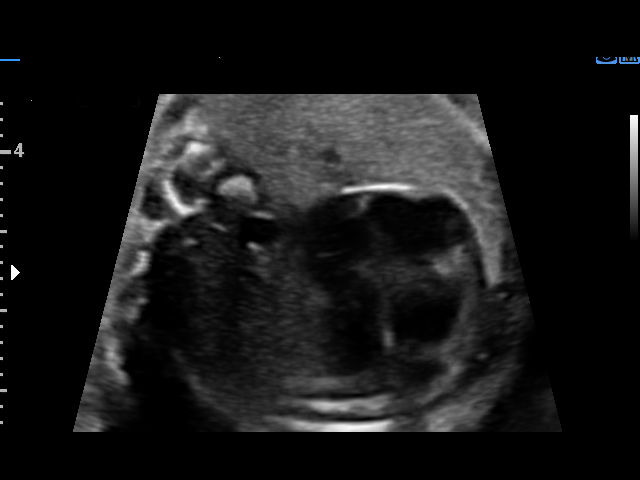
[im 38/57]
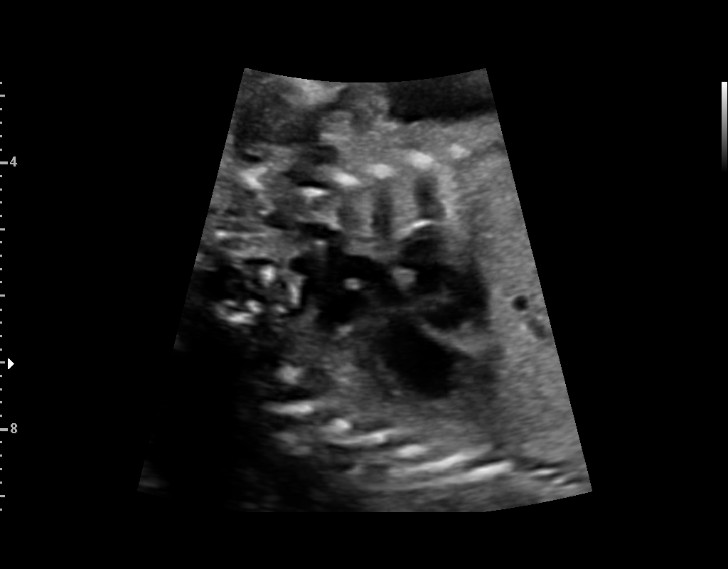
[im 42/57]
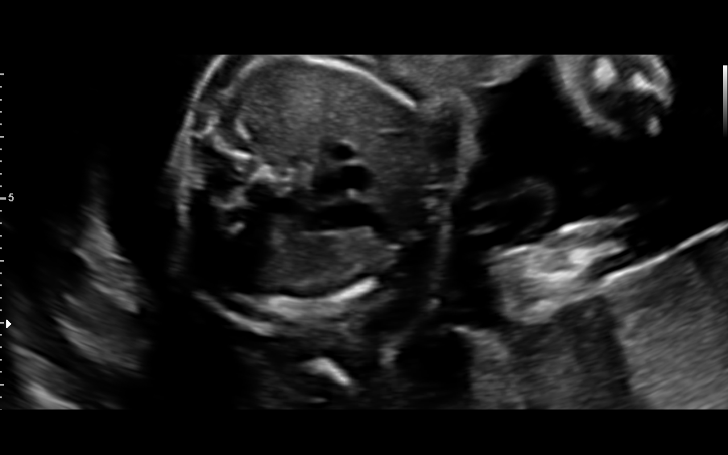
[im 46/57]
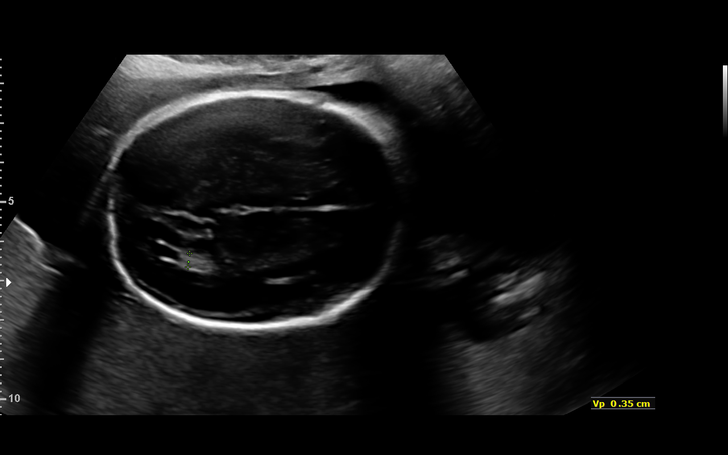
[im 50/57]
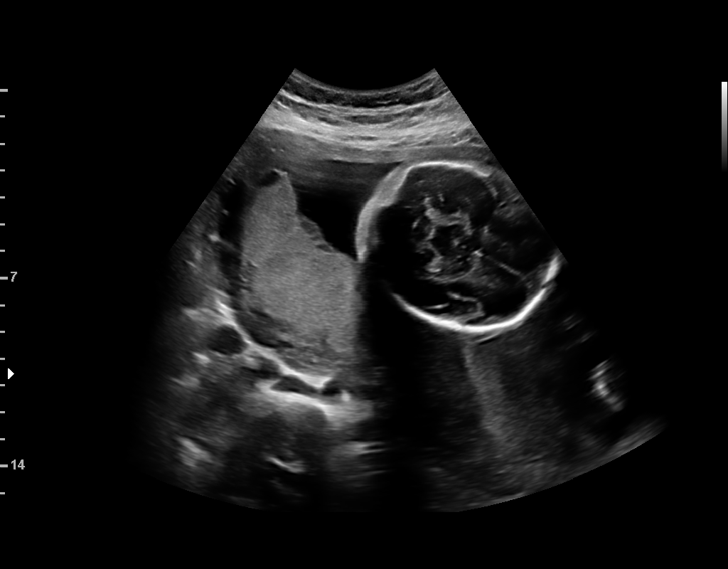
[im 54/57]
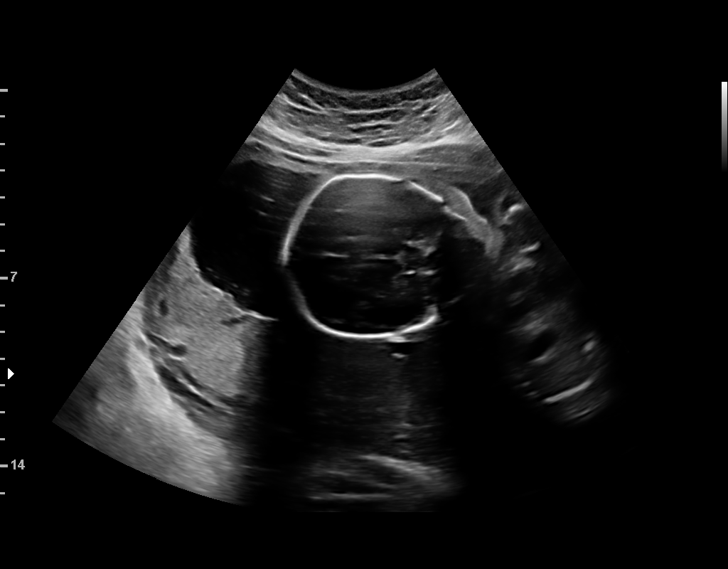

[13 of 28 positions shown; findings below may reference images not displayed]

Road [HOSPITAL]

Indications

24 weeks gestation of pregnancy
Antenatal follow-up for nonvisualized fetal
anatomy
Fetal abnormality - bilateral UTD A1
OB History

Blood Type:            Height:  5'1"   Weight (lb):  142       BMI:
Gravidity:    2         Term:   0        Prem:   0        SAB:   1
TOP:          0       Ectopic:  0        Living: 0
Fetal Evaluation

Num Of Fetuses:     1
Cardiac Activity:   Observed
Presentation:       Breech
Placenta:           Posterior, above cervical os
P. Cord Insertion:  Previously Visualized

Amniotic Fluid
AFI FV:      Subjectively within normal limits

Largest Pocket(cm)
4.41
Biometry

BPD:      58.5  mm     G. Age:  24w 0d         26  %    CI:        71.72   %    70 - 86
FL/HC:      19.7   %    18.7 -
HC:      219.9  mm     G. Age:  24w 0d         19  %    HC/AC:      1.13        1.05 -
AC:      195.1  mm     G. Age:  24w 1d         34  %    FL/BPD:     74.0   %    71 - 87
FL:       43.3  mm     G. Age:  24w 2d         30  %    FL/AC:      22.2   %    20 - 24
HUM:      40.8  mm     G. Age:  24w 5d         49  %
Est. FW:     668  gm      1 lb 8 oz     47  %
Gestational Age

LMP:           24w 3d        Date:  11/27/16                 EDD:   09/03/17
U/S Today:     24w 1d                                        EDD:   09/05/17
Best:          24w 3d     Det. By:  LMP  (11/27/16)          EDD:   09/03/17
Anatomy

Cranium:               Appears normal         Aortic Arch:            Previously seen
Cavum:                 Appears normal         Ductal Arch:            Not well visualized
Ventricles:            Appears normal         Diaphragm:              Appears normal
Choroid Plexus:        Previously seen        Stomach:                Appears normal, left
sided
Cerebellum:            Previously seen        Abdomen:                Appears normal
Posterior Fossa:       Previously seen        Abdominal Wall:         Previously seen
Nuchal Fold:           Previously seen        Cord Vessels:           Previously seen
Face:                  Orbits and profile     Kidneys:                Left UTD; 5 mms
previously seen
Lips:                  Previously seen        Bladder:                Appears normal
Thoracic:              Appears normal         Spine:                  Previously seen
Heart:                 Appears normal         Upper Extremities:      Previously seen
(4CH, axis, and situs
RVOT:                  Previously seen        Lower Extremities:      Previously seen
LVOT:                  Appears normal

Other:  Fetus appears to be a male. Heels previoulsy visualized.
Cervix Uterus Adnexa

Cervix
Length:           3.56  cm.
Impression

SIUP at 24+3 weeks
Left UTD A1; renal pelvis measured 5 mms
All other interval fetal anatomy was seen and appeared
normal; anatomic survey complete
Normal amniotic fluid volume
Appropriate interval growth with EFW at the 47th %tile
Recommendations

Follow-up ultrasound at 
 32 weeks to reassess kidneys

## 2019-01-26 ENCOUNTER — Encounter: Payer: Self-pay | Admitting: Obstetrics & Gynecology

## 2019-01-26 ENCOUNTER — Ambulatory Visit (INDEPENDENT_AMBULATORY_CARE_PROVIDER_SITE_OTHER): Payer: Medicaid Other | Admitting: Obstetrics & Gynecology

## 2019-01-26 ENCOUNTER — Emergency Department (HOSPITAL_COMMUNITY)
Admission: EM | Admit: 2019-01-26 | Discharge: 2019-01-26 | Disposition: A | Discharge status: Home / Self Care | Payer: Medicaid Other | Attending: Emergency Medicine | Admitting: Emergency Medicine

## 2019-01-26 ENCOUNTER — Encounter (HOSPITAL_COMMUNITY): Payer: Self-pay | Admitting: Emergency Medicine

## 2019-01-26 ENCOUNTER — Other Ambulatory Visit: Payer: Self-pay

## 2019-01-26 VITALS — Temp 97.2°F

## 2019-01-26 DIAGNOSIS — Z3042 Encounter for surveillance of injectable contraceptive: Secondary | ICD-10-CM

## 2019-01-26 DIAGNOSIS — Z4889 Encounter for other specified surgical aftercare: Secondary | ICD-10-CM | POA: Insufficient documentation

## 2019-01-26 DIAGNOSIS — Z79899 Other long term (current) drug therapy: Secondary | ICD-10-CM | POA: Insufficient documentation

## 2019-01-26 DIAGNOSIS — Z87891 Personal history of nicotine dependence: Secondary | ICD-10-CM | POA: Insufficient documentation

## 2019-01-26 DIAGNOSIS — Z3A00 Weeks of gestation of pregnancy not specified: Secondary | ICD-10-CM | POA: Insufficient documentation

## 2019-01-26 DIAGNOSIS — Z8759 Personal history of other complications of pregnancy, childbirth and the puerperium: Secondary | ICD-10-CM

## 2019-01-26 DIAGNOSIS — O089 Unspecified complication following an ectopic and molar pregnancy: Secondary | ICD-10-CM | POA: Diagnosis not present

## 2019-01-26 DIAGNOSIS — Z9889 Other specified postprocedural states: Secondary | ICD-10-CM

## 2019-01-26 DIAGNOSIS — O0091 Unspecified ectopic pregnancy with intrauterine pregnancy: Secondary | ICD-10-CM | POA: Diagnosis not present

## 2019-01-26 DIAGNOSIS — Z09 Encounter for follow-up examination after completed treatment for conditions other than malignant neoplasm: Secondary | ICD-10-CM

## 2019-01-26 DIAGNOSIS — U071 COVID-19: Secondary | ICD-10-CM

## 2019-01-26 MED ORDER — MEDROXYPROGESTERONE ACETATE 150 MG/ML IM SUSP
150.0000 mg | Freq: Once | INTRAMUSCULAR | Status: AC
  Administered 2019-01-26: 16:00:00 150 mg via INTRAMUSCULAR

## 2019-01-26 NOTE — Discharge Instructions (Signed)
Please go to the women's outpatient clinic for a 2:35 PM appointment today.  Please arrive 15 minutes early.

## 2019-01-26 NOTE — ED Provider Notes (Signed)
[facility] Provider Note   CSN: [id number] Arrival date & time: 01/26/19  1123     History   Chief Complaint Chief Complaint  Patient presents with  . Suture / Staple Removal    HPI Sonya Frazier is a 23 y.o. female.     HPI   Patient is a 23 year old female past medical history of anemia, preeclampsia, and recent right salpingectomy for ectopic pregnancy presenting for staple removal.  Patient reports that she had her procedure on 01-14-2019, and was told to call in a week to follow-up for staple removal.  She reports that she has been unable to get through to the clinic.  She denies any purulent drainage, erythema, or significant pain around the wound sites.  She does continue to have some vaginal bleeding that she has had since 6-19 however it is improving.  Past Medical History:  Diagnosis Date  . Anemia   . Medical history non-contributory   . Preeclampsia     Patient Active Problem List   Diagnosis Date Noted  . COVID-19 virus infection 01/19/2019  . Ectopic pregnancy, tubal 01/13/2019  . S/P C-section 09/10/2017    Past Surgical History:  Procedure Laterality Date  . CESAREAN SECTION N/A 09/10/2017   Procedure: CESAREAN SECTION;  Surgeon: Lazaro ArmsEure, Luther H, MD;  [facility];  Service: Obstetrics;  Laterality: N/A;  . LAPAROSCOPIC UNILATERAL SALPINGECTOMY Right 01/13/2019   Procedure: LAPAROSCOPIC UNILATERAL SALPINGECTOMY;  Surgeon: Lazaro ArmsEure, Luther H, MD;  [facility];  Service: Gynecology;  Laterality: Right;  . NO PAST SURGERIES    . WISDOM TOOTH EXTRACTION       OB History    Gravida  3   Para  1   Term  1   Preterm  0   AB  1   Living  1     SAB  1   TAB  0   Ectopic  0   Multiple  0   Live Births  1            Home Medications    Prior to Admission medications   Medication Sig Start Date End Date Taking? Authorizing Provider  hydrochlorothiazide (HYDRODIURIL) 25 MG  tablet Take 1 tablet (25 mg total) by mouth daily. Patient not taking: Reported on 10/18/2018 09/13/17   Lazaro ArmsEure, Luther H, MD  HYDROcodone-acetaminophen (NORCO/VICODIN) 5-325 MG tablet Take 1 tablet by mouth every 6 (six) hours as needed. 01/14/19   Lazaro ArmsEure, Luther H, MD  ibuprofen (ADVIL,MOTRIN) 800 MG tablet Take 1 tablet (800 mg total) by mouth every 8 (eight) hours as needed. Patient not taking: Reported on 10/18/2018 10/13/17   Orvilla Cornwallenney, Rachelle A, CNM  ketorolac (TORADOL) 10 MG tablet Take 1 tablet (10 mg total) by mouth every 8 (eight) hours as needed. 01/14/19   Lazaro ArmsEure, Luther H, MD  medroxyPROGESTERone (DEPO-PROVERA) 150 MG/ML injection Inject 1 mL (150 mg total) into the muscle every 3 (three) months. Patient not taking: Reported on 10/18/2018 01/20/18   Adam PhenixArnold, James G, MD  omeprazole (PRILOSEC) 20 MG capsule Take 1 capsule (20 mg total) by mouth daily. 10/19/18   Palumbo, April, MD  ondansetron (ZOFRAN ODT) 8 MG disintegrating tablet Take 1 tablet (8 mg total) by mouth every 8 (eight) hours as needed for nausea or vomiting. 01/14/19   Lazaro ArmsEure, Luther H, MD  Prenat-FeCbn-FeAspGl-FA-Omega (OB COMPLETE PETITE) 35-5-1-200 MG CAPS Take 1 tablet by mouth daily. Patient not taking: Reported on 10/07/2017 02/09/17  Denney, Rachelle A, CNM  sulfamethoxazole-trimethoprim (BACTRIM DS) 800-160 MG tablet Take 1 tablet by mouth 2 (two) times daily. 01/17/19   Tresea Mall, CNM  sulfamethoxazole-trimethoprim (BACTRIM DS) 800-160 MG tablet Take 1 tablet by mouth 2 (two) times daily. 01/17/19   Florian Buff, MD    Family History Family History  Problem Relation Age of Onset  . Asthma Mother   . Cancer Maternal Aunt     Social History Social History   Tobacco Use  . Smoking status: Former Research scientist (life sciences)  . Smokeless tobacco: Never Used  Substance Use Topics  . Alcohol use: No    Comment: occ  . Drug use: No    Types: Marijuana    Comment: Quit approx 1 year ago     Allergies   Patient has no known allergies.    Review of Systems Review of Systems  Constitutional: Negative for chills and fever.  Skin: Positive for wound. Negative for color change.     Physical Exam Updated Vital Signs BP 128/72 (BP Location: Right Arm)   Pulse 85   Temp 99.1 F (37.3 C) (Oral)   Resp 16   LMP 11/22/2018   SpO2 100%   Physical Exam Vitals signs and nursing note reviewed.  Constitutional:      General: She is not in acute distress.    Appearance: She is well-developed. She is not diaphoretic.     Comments: Sitting comfortably in bed.  HENT:     Head: Normocephalic and atraumatic.  Eyes:     General:        Right eye: No discharge.        Left eye: No discharge.     Conjunctiva/sclera: Conjunctivae normal.     Comments: EOMs normal to gross examination.  Neck:     Musculoskeletal: Normal range of motion.  Cardiovascular:     Rate and Rhythm: Normal rate and regular rhythm.     Comments: Intact, 2+ radial pulse. Pulmonary:     Comments: Converses comfortably.  No audible wheeze or stridor. Abdominal:     General: There is no distension.     Tenderness: There is no abdominal tenderness.  Musculoskeletal: Normal range of motion.  Skin:    General: Skin is warm and dry.     Comments: Patient has 3 well-healing postsurgical wounds of the right lower quadrant, middle abdomen and left lower quadrant.  No drainage.  No erythema.  Staples in place.  Neurological:     Mental Status: She is alert.     Comments: Cranial nerves intact to gross observation. Patient moves extremities without difficulty.  Psychiatric:        Behavior: Behavior normal.        Thought Content: Thought content normal.        Judgment: Judgment normal.      ED Treatments / Results  Labs (all labs ordered are listed, but only abnormal results are displayed) Labs Reviewed - No data to display  EKG    Radiology No results found.  Procedures Procedures (including critical care time)  Medications Ordered in ED  Medications - No data to display   Initial Impression / Assessment and Plan / ED Course  I have reviewed the triage vital signs and the nursing notes.  Pertinent labs & imaging results that were available during my care of the patient were reviewed by me and considered in my medical decision making (see chart for details).  Clinical Course as of Jan 26 1227  Thu Jan 26, 2019  1225 Spoke with Shanda BumpsJessica, CNM who states [facility] [address] at 2:35 pm for postop appointment and staple removal.  She is referred here, will not remove staples in the ER.  Plan was reviewed per MAU provider with Dr. Jolayne Pantherconstant.   [AM]    Clinical Course User Index [AM] Elisha PonderMurray, Kalee Broxton B, PA-C       This is a well-appearing [age] year old female status post right salpingectomy for ectopic pregnancy on 01-14-2019 presenting for staple removal.  Explained with the patient that we would like her to have a postoperative visit with her surgical team.  Case was reviewed with MAU provider, Shanda BumpsJessica, who states [facility] or we can take out staples in the ER and have her follow-up next week.  Patient elected to go today.  Staples were left in place.  She was given instructions on how to get to the women's outpatient clinic.  Patient is in understanding and agrees with the plan of care.  Final Clinical Impressions(s) / ED Diagnoses   Final diagnoses:  Encounter for post surgical wound check    ED Discharge Orders    None       Delia ChimesMurray, Clydia Nieves B, PA-C 01/26/19 1234    Sabas SousBero, Michael M, MD 02/01/19 [phone]

## 2019-01-26 NOTE — Patient Instructions (Signed)
Return to clinic for any scheduled appointments or for any gynecologic concerns as needed.   

## 2019-01-26 NOTE — ED Notes (Signed)
Patient verbalizes understanding of discharge instructions. Opportunity for questioning and answers were provided. Armband removed by staff, pt discharged from ED.    Pt provided with an appointment for staple removal.

## 2019-01-26 NOTE — ED Triage Notes (Signed)
Pt arrives to ED from home with complaints of needing staple removal of her stomach from her LAPAROSCOPIC UNILATERAL SALPINGECTOMY surgery last week.

## 2019-01-26 NOTE — Progress Notes (Signed)
Subjective:     Sonya Frazier is a 23 y.o. G65P1011 female who presents to the clinic 2 weeks status post laparoscopic right salpingectomy for the operative management of a right ectopic pregnancy. Was incidentally found to be COVID positive on preoperative testing, no symptoms. Still has no symptoms, has been quarantined as per her report.  Eating a regular diet without difficulty. Bowel movements are normal. The patient is not having any pain.  The following portions of the patient's history were reviewed and updated as appropriate: allergies, current medications, past family history, past medical history, past social history, past surgical history and problem list. Normal pap in 2018.   Review of Systems Pertinent items noted in HPI and remainder of comprehensive ROS otherwise negative.    Objective:    Temp (!) 97.2 F (36.2 C)   LMP 11/22/2018  General:  alert and no distress  Abdomen: soft, bowel sounds active, non-tender  Incision:   healing well, no drainage, no erythema, no hernia, no seroma, no swelling, no dehiscence, incision well approximated. Incisional staples removed   01/14/2019 Surgical Pathology  Fallopian tube, ectopic pregnancy, right, [redacted]w[redacted]d - SEGMENT OF DILATED FALLOPIAN TUBE WITH PRODUCTS OF CONCEPTION (CHORIONIC VILLI AND FETUS, 1.7 CM)    Assessment:    Doing well postoperatively. Operative findings again reviewed. Pathology report discussed.    Plan:     1. Postop check Continue any current medications.  Wound care discussed.  Activity restrictions: none  2. History of ectopic pregnancy Positive pathology. No further intervention needed.  3. Encounter for Depo-Provera contraception Depo Provera given today. Safe sex recommended, also encouraged use of condoms as back up for first two weeks.  4. COVID-19 virus infection Asymptomatic. Told to quarantine as recommended, go to ED for worsening symptoms.   Verita Schneiders, MD, [place] for [employer], [place]

## 2019-01-27 ENCOUNTER — Encounter (INDEPENDENT_AMBULATORY_CARE_PROVIDER_SITE_OTHER): Payer: Medicaid Other | Admitting: Emergency Medicine

## 2019-03-22 ENCOUNTER — Encounter (HOSPITAL_COMMUNITY): Payer: Self-pay | Admitting: *Deleted

## 2019-03-22 ENCOUNTER — Inpatient Hospital Stay (HOSPITAL_COMMUNITY)
Admission: AD | Admit: 2019-03-22 | Discharge: 2019-03-22 | Disposition: A | Discharge status: Home / Self Care | Payer: Medicaid Other | Attending: Obstetrics and Gynecology | Admitting: Obstetrics and Gynecology

## 2019-03-22 ENCOUNTER — Other Ambulatory Visit: Payer: Self-pay

## 2019-03-22 DIAGNOSIS — K5900 Constipation, unspecified: Secondary | ICD-10-CM | POA: Insufficient documentation

## 2019-03-22 DIAGNOSIS — R109 Unspecified abdominal pain: Secondary | ICD-10-CM | POA: Diagnosis not present

## 2019-03-22 DIAGNOSIS — R1030 Lower abdominal pain, unspecified: Secondary | ICD-10-CM

## 2019-03-22 DIAGNOSIS — Z3202 Encounter for pregnancy test, result negative: Secondary | ICD-10-CM | POA: Diagnosis not present

## 2019-03-22 DIAGNOSIS — K5909 Other constipation: Secondary | ICD-10-CM

## 2019-03-22 LAB — URINALYSIS, ROUTINE W REFLEX MICROSCOPIC
Bilirubin Urine: NEGATIVE
Glucose, UA: NEGATIVE mg/dL
Hgb urine dipstick: NEGATIVE
Ketones, ur: NEGATIVE mg/dL
Leukocytes,Ua: NEGATIVE
Nitrite: NEGATIVE
Protein, ur: NEGATIVE mg/dL
Specific Gravity, Urine: 1.02 (ref 1.005–1.030)
pH: 6 (ref 5.0–8.0)

## 2019-03-22 LAB — POCT PREGNANCY, URINE: Preg Test, Ur: NEGATIVE

## 2019-03-22 NOTE — MAU Provider Note (Addendum)
First Provider Initiated Contact with Patient 03/22/19 1403     S [name] is a 23 y.o. G2P1011 non-pregnant female who presents to MAU today with complaint of lower abdominal pain.  History of ectopic pregnancy with tube removal on 01-14-19.  Was seen in the office on 01-26-19 for a follow up and received Depo then.  Reports she waited to have intercourse but that a family member got pregnant on Depo and she is thinking she is pregnant.  Has been constipated.  Had a stool today and the pain is continuing all across the lower abdomen.  Urine pregnancy test is negative.  History of positive asymptomatic covid in June 2020  O BP 129/82   Pulse 86   Temp 98 F (36.7 C)   Resp 16   Wt 77.1 kg   LMP 01/22/2019   Breastfeeding Unknown   BMI 33.20 kg/m    Physical Exam GENERAL: Well-developed, well-nourished female in no acute distress.  HEENT: Normocephalic, atraumatic.  LUNGS: Effort normal  HEART: Regular rate  SKIN: Warm, dry and without erythema  PSYCH: Normal mood and affect   A Non pregnant female Medical screening exam complete Lower abdominal pain Constipation   P Discharge from MAU in stable condition Patient given the option of transfer to [facility] for further evaluation or seek care in outpatient facility of choice - will call [facility] to be seen there. List of options for follow-up given  Warning signs for worsening condition that would warrant emergency follow-up discussed Patient may return to MAU as needed for pregnancy related complaints Reviewed continuing to use ibuprofen PO as needed. Drink at least 8 8-oz glasses of water every day. Get an OTC stool softener to use to have a soft stool daily.  Virginia Rochester, NP 03/22/2019 2:03 PM

## 2019-03-22 NOTE — MAU Note (Signed)
.   Sonya Frazier is a 23 y.o. at [redacted]w[redacted]d here in MAU reporting: lower abdominal pain for a couple of weeks 9/10, states it feels like a stabbing pain. Pt had surgery for ectopic pregnancy 01/22/19. Depo shot a week after the surgery LMP: unsure Onset of complaint:  Couple of weeks Pain score: 9 Vitals:   03/22/19 1339  BP: 129/82  Pulse: 86  Resp: 16  Temp: 98 F (36.7 C)     FHT: Lab orders placed from triage: UA/UPT

## 2019-04-27 ENCOUNTER — Telehealth: Payer: Self-pay | Admitting: Family Medicine

## 2019-04-27 NOTE — Telephone Encounter (Signed)
Spoke to patient about her appointment on 9/25 @ 9:20. Patient instructed to wear a face mask for the entire appointment and no visitors are allowed with her during the visit. Patient screened for covid symptoms and denied having any

## 2019-04-28 ENCOUNTER — Ambulatory Visit (INDEPENDENT_AMBULATORY_CARE_PROVIDER_SITE_OTHER): Payer: Medicaid Other | Admitting: *Deleted

## 2019-04-28 ENCOUNTER — Other Ambulatory Visit: Payer: Self-pay

## 2019-04-28 ENCOUNTER — Encounter: Payer: Self-pay | Admitting: *Deleted

## 2019-04-28 VITALS — BP 112/68 | HR 74 | Temp 98.3°F | Ht 60.0 in | Wt 167.6 lb

## 2019-04-28 DIAGNOSIS — Z3042 Encounter for surveillance of injectable contraceptive: Secondary | ICD-10-CM

## 2019-04-28 DIAGNOSIS — Z3202 Encounter for pregnancy test, result negative: Secondary | ICD-10-CM

## 2019-04-28 LAB — POCT PREGNANCY, URINE: Preg Test, Ur: NEGATIVE

## 2019-04-28 MED ORDER — MEDROXYPROGESTERONE ACETATE 150 MG/ML IM SUSP
150.0000 mg | Freq: Once | INTRAMUSCULAR | Status: AC
  Administered 2019-04-28: 10:00:00 150 mg via INTRAMUSCULAR

## 2019-04-28 NOTE — Progress Notes (Signed)
Pt here for Depo Provera injection which was due 9/10-9/24. UPT performed and was negative. Pt states she had some nausea last week and change in mood. Her mother told her she needed a blood pregnancy test. Per consult with Dr. Myer Peer is not needed due to negative UPT today. This was explained to pt. Depo Provera 150 IM administered and pt tolerated well. Pt was informed that she will need Annual Gyn exam @ the time of her next Depo injection 12/11-12/25. She voiced understanding.

## 2019-04-29 NOTE — Progress Notes (Signed)
Patient seen and assessed by nursing staff during this encounter. I have reviewed the chart and agree with the documentation and plan.  Delvis Kau, MD 04/29/2019 10:18 AM    

## 2019-05-14 ENCOUNTER — Other Ambulatory Visit: Payer: Self-pay

## 2019-05-14 ENCOUNTER — Encounter (HOSPITAL_COMMUNITY): Payer: Self-pay | Admitting: Emergency Medicine

## 2019-05-14 ENCOUNTER — Emergency Department (HOSPITAL_COMMUNITY)
Admission: EM | Admit: 2019-05-14 | Discharge: 2019-05-14 | Disposition: A | Discharge status: Home / Self Care | Payer: Medicaid Other | Attending: Emergency Medicine | Admitting: Emergency Medicine

## 2019-05-14 ENCOUNTER — Emergency Department (HOSPITAL_COMMUNITY): Payer: Medicaid Other

## 2019-05-14 DIAGNOSIS — B9689 Other specified bacterial agents as the cause of diseases classified elsewhere: Secondary | ICD-10-CM | POA: Diagnosis not present

## 2019-05-14 DIAGNOSIS — N739 Female pelvic inflammatory disease, unspecified: Secondary | ICD-10-CM | POA: Insufficient documentation

## 2019-05-14 DIAGNOSIS — R102 Pelvic and perineal pain: Secondary | ICD-10-CM | POA: Diagnosis not present

## 2019-05-14 DIAGNOSIS — N760 Acute vaginitis: Secondary | ICD-10-CM | POA: Insufficient documentation

## 2019-05-14 DIAGNOSIS — N730 Acute parametritis and pelvic cellulitis: Secondary | ICD-10-CM

## 2019-05-14 LAB — COMPREHENSIVE METABOLIC PANEL
ALT: 24 U/L (ref 0–44)
AST: 20 U/L (ref 15–41)
Albumin: 4.8 g/dL (ref 3.5–5.0)
Alkaline Phosphatase: 82 U/L (ref 38–126)
Anion gap: 9 (ref 5–15)
BUN: 14 mg/dL (ref 6–20)
CO2: 22 mmol/L (ref 22–32)
Calcium: 10.2 mg/dL (ref 8.9–10.3)
Chloride: 107 mmol/L (ref 98–111)
Creatinine, Ser: 0.91 mg/dL (ref 0.44–1.00)
GFR calc Af Amer: >60 mL/min (ref >60)
GFR calc non Af Amer: >60 mL/min (ref >60)
Glucose, Bld: 90 mg/dL (ref 70–99)
Potassium: 3.6 mmol/L (ref 3.5–5.1)
Sodium: 138 mmol/L (ref 135–145)
Total Bilirubin: 0.4 mg/dL (ref 0.3–1.2)
Total Protein: 9.4 g/dL — ABNORMAL HIGH (ref 6.5–8.1)

## 2019-05-14 LAB — CBC
HCT: 45.9 % (ref 36.0–46.0)
Hemoglobin: 14.8 g/dL (ref 12.0–15.0)
MCH: 29.1 pg (ref 26.0–34.0)
MCHC: 32.2 g/dL (ref 30.0–36.0)
MCV: 90.2 fL (ref 80.0–100.0)
Platelets: 332 10*3/uL (ref 150–400)
RBC: 5.09 MIL/uL (ref 3.87–5.11)
RDW: 11.9 % (ref 11.5–15.5)
WBC: 9.2 10*3/uL (ref 4.0–10.5)
nRBC: 0 % (ref 0.0–0.2)

## 2019-05-14 LAB — URINALYSIS, ROUTINE W REFLEX MICROSCOPIC
Bilirubin Urine: NEGATIVE
Glucose, UA: NEGATIVE mg/dL
Hgb urine dipstick: NEGATIVE
Ketones, ur: NEGATIVE mg/dL
Leukocytes,Ua: NEGATIVE
Nitrite: NEGATIVE
Protein, ur: NEGATIVE mg/dL
Specific Gravity, Urine: 1.026 (ref 1.005–1.030)
pH: 5 (ref 5.0–8.0)

## 2019-05-14 LAB — RAPID HIV SCREEN (HIV 1/2 AB+AG)
HIV 1/2 Antibodies: NONREACTIVE
HIV-1 P24 Antigen - HIV24: NONREACTIVE

## 2019-05-14 LAB — WET PREP, GENITAL
Sperm: NONE SEEN
Trich, Wet Prep: NONE SEEN
Yeast Wet Prep HPF POC: NONE SEEN

## 2019-05-14 LAB — PREGNANCY, URINE: Preg Test, Ur: NEGATIVE

## 2019-05-14 LAB — LIPASE, BLOOD: Lipase: 33 U/L (ref 11–51)

## 2019-05-14 MED ORDER — METRONIDAZOLE 500 MG PO TABS: 500.0000 mg | ORAL_TABLET | Freq: Two times a day (BID) | ORAL | 0 refills | Status: AC

## 2019-05-14 MED ORDER — METRONIDAZOLE 500 MG PO TABS: 500.0000 mg | ORAL_TABLET | Freq: Two times a day (BID) | ORAL | 0 refills | Status: DC

## 2019-05-14 MED ORDER — AZITHROMYCIN 250 MG PO TABS
1000.0000 mg | ORAL_TABLET | Freq: Once | ORAL | Status: AC
  Administered 2019-05-14: 1000 mg via ORAL
  Filled 2019-05-14: qty 4

## 2019-05-14 MED ORDER — SODIUM CHLORIDE 0.9% FLUSH
3.0000 mL | Freq: Once | INTRAVENOUS | Status: AC
  Administered 2019-05-14: 13:00:00 3 mL via INTRAVENOUS

## 2019-05-14 MED ORDER — DOXYCYCLINE HYCLATE 100 MG PO CAPS: 100.0000 mg | ORAL_CAPSULE | Freq: Two times a day (BID) | ORAL | 0 refills | Status: AC

## 2019-05-14 MED ORDER — CEFTRIAXONE SODIUM 250 MG IJ SOLR
250.0000 mg | Freq: Once | INTRAMUSCULAR | Status: AC
  Administered 2019-05-14: 250 mg via INTRAMUSCULAR
  Filled 2019-05-14: qty 250

## 2019-05-14 NOTE — ED Provider Notes (Signed)
[facility]-EMERGENCY DEPT Provider Note   CSN: [id number] Arrival date & time: 05/14/19  1026     History   Chief Complaint Chief Complaint  Patient presents with  . Abdominal Pain  . Nausea  . moody    HPI Sonya Frazier is a 23 y.o. female.     HPI  Pt is a 23 y/o female who presents to the ED today for eval of pelvic pain that has been intermittent for the last month but became constant 1 week ago.  Pain is sharp and stabbing. Rates pain 4-5/10. She has been taking tylenol with intermittent relief.  She also reports nausea, lightheadedness, headaches, urinary frequency. Denies vomiting, diarrhea, constipation, dysuria, hematuria, vaginal bleeding, abnormal vaginal discharge, or fevers.  She is currently sexually active with one sexual partner that she does not use protection with. She is monogamous with this person and denies concern for STD.   Past Medical History:  Diagnosis Date  . Anemia   . Medical history non-contributory   . Preeclampsia     Patient Active Problem List   Diagnosis Date Noted  . COVID-19 virus infection 01/19/2019  . Ectopic pregnancy, tubal 01/13/2019  . S/P C-section 09/10/2017    Past Surgical History:  Procedure Laterality Date  . CESAREAN SECTION N/A 09/10/2017   Procedure: CESAREAN SECTION;  Surgeon: Lazaro Arms, MD;  Location: [facility] BIRTHING SUITES;  Service: Obstetrics;  Laterality: N/A;  . LAPAROSCOPIC UNILATERAL SALPINGECTOMY Right 01/13/2019   Procedure: LAPAROSCOPIC UNILATERAL SALPINGECTOMY;  Surgeon: Lazaro Arms, MD;  Location: [facility] OR;  Service: Gynecology;  Laterality: Right;  . NO PAST SURGERIES    . WISDOM TOOTH EXTRACTION       OB History    Gravida  3   Para  1   Term  1   Preterm  0   AB  1   Living  1     SAB  1   TAB  0   Ectopic  0   Multiple  0   Live Births  1            Home Medications    Prior to Admission medications   Medication Sig Start Date End Date  Taking? Authorizing Provider  acetaminophen (TYLENOL) 325 MG tablet Take 650 mg by mouth every 6 (six) hours as needed for moderate pain.   Yes [provider]  medroxyPROGESTERone (DEPO-PROVERA) 150 MG/ML injection Inject 1 mL (150 mg total) into the muscle every 3 (three) months. 01/20/18  Yes Adam Phenix, MD  doxycycline (VIBRAMYCIN) 100 MG capsule Take 1 capsule (100 mg total) by mouth 2 (two) times daily for 14 days. 05/14/19 05/28/19  Kaliel Bolds S, PA-C  metroNIDAZOLE (FLAGYL) 500 MG tablet Take 1 tablet (500 mg total) by mouth 2 (two) times daily for 7 days. 05/14/19 05/21/19  Brigitta Pricer S, PA-C    Family History Family History  Problem Relation Age of Onset  . Asthma Mother   . Cancer Maternal Aunt     Social History Social History   Tobacco Use  . Smoking status: Never Smoker  . Smokeless tobacco: Never Used  Substance Use Topics  . Alcohol use: No    Comment: occ  . Drug use: No    Types: Marijuana    Comment: Quit approx 1 year ago     Allergies   Patient has no known allergies.   Review of Systems Review of Systems  Constitutional: Negative for  fever.  HENT: Negative for ear pain and sore throat.   Eyes: Negative for visual disturbance.  Respiratory: Negative for cough and shortness of breath.   Cardiovascular: Negative for chest pain.  Gastrointestinal: Positive for nausea. Negative for abdominal pain, constipation, diarrhea and vomiting.  Genitourinary: Positive for frequency and pelvic pain. Negative for dysuria, hematuria and vaginal bleeding.  Musculoskeletal: Negative for back pain.  Skin: Negative for rash.  Neurological: Positive for light-headedness and headaches.  All other systems reviewed and are negative.    Physical Exam Updated Vital Signs BP 118/78   Pulse 72   Temp 98.7 F (37.1 C) (Oral)   Resp 16   SpO2 100%   Physical Exam Vitals signs and nursing note reviewed.  Constitutional:      General: She is not  in acute distress.    Appearance: She is well-developed.  HENT:     Head: Normocephalic and atraumatic.  Eyes:     Conjunctiva/sclera: Conjunctivae normal.  Neck:     Musculoskeletal: Neck supple.  Cardiovascular:     Rate and Rhythm: Normal rate and regular rhythm.     Heart sounds: No murmur.  Pulmonary:     Effort: Pulmonary effort is normal. No respiratory distress.     Breath sounds: Normal breath sounds.  Abdominal:     Palpations: Abdomen is soft.     Tenderness: There is no abdominal tenderness.  Genitourinary:    Comments: Exam performed by Karrie Meresortni S Savina Olshefski,  exam chaperoned Date: 05/14/2019 Pelvic exam: normal external genitalia without evidence of trauma. VULVA: normal appearing vulva with no masses, tenderness or lesion. VAGINA: normal appearing vagina with normal color and  no lesions. CERVIX: normal appearing cervix without lesions, cervical motion tenderness absent, cervical os closed with purulent yellow discharge; vaginal discharge is present, Wet prep and DNA probe for chlamydia and GC obtained.   ADNEXA: normal adnexa in size, left adnexal TTP UTERUS: uterus is normal size, shape, consistency and is mildly tender Skin:    General: Skin is warm and dry.  Neurological:     Mental Status: She is alert.     ED Treatments / Results  Labs (all labs ordered are listed, but only abnormal results are displayed) Labs Reviewed  WET PREP, GENITAL - Abnormal; Notable for the following components:      Result Value   Clue Cells Wet Prep HPF POC PRESENT (*)    WBC, Wet Prep HPF POC FEW (*)    All other components within normal limits  COMPREHENSIVE METABOLIC PANEL - Abnormal; Notable for the following components:   Total Protein 9.4 (*)    All other components within normal limits  LIPASE, BLOOD  CBC  URINALYSIS, ROUTINE W REFLEX MICROSCOPIC  RAPID HIV SCREEN (HIV 1/2 AB+AG)  PREGNANCY, URINE  RPR  I-STAT BETA HCG BLOOD, ED ([facility], [facility], [facility] ONLY)  GC/CHLAMYDIA PROBE  AMP ([place]) [facility]    EKG None  Radiology [place] Transvaginal Non-ob  Result Date: 05/14/2019 CLINICAL DATA:  Pelvic pain. History of prior surgery for ectopic pregnancy. EXAM: TRANSABDOMINAL AND TRANSVAGINAL ULTRASOUND OF PELVIS DOPPLER ULTRASOUND OF OVARIES TECHNIQUE: Both transabdominal and transvaginal ultrasound examinations of the pelvis were performed. Transabdominal technique was performed for global imaging of the pelvis including uterus, ovaries, adnexal regions, and pelvic cul-de-sac. It was necessary to proceed with endovaginal exam following the transabdominal exam to visualize the adnexa. Color and duplex Doppler ultrasound was utilized to evaluate blood flow to the ovaries. COMPARISON:  None. FINDINGS: Uterus Measurements: 6.0 x 3.9 x 4.4 cm = volume: There is 53.8 mL. No fibroids or other mass visualized. Endometrium Thickness: 0.3 cm.  No focal abnormality visualized. Right ovary Measurements: 2.4 x 1.4 x 2.1 cm = volume: 3.7 mL. Normal appearance/no adnexal mass. Left ovary Measurements: 2.1 x 1.2 x 1.8 cm = volume: 2.4 mL. Normal appearance/no adnexal mass. Pulsed Doppler evaluation of both ovaries demonstrates normal low-resistance arterial and venous waveforms. Other findings No abnormal free fluid. IMPRESSION: Negative for ovarian torsion.  Normal exam. Electronically Signed   By: Drusilla Kanner M.D.   On: 05/14/2019 14:49   US Pelvis Complete  Result Date: 05/14/2019 CLINICAL DATA:  Pelvic pain. History of prior surgery for ectopic pregnancy. EXAM: TRANSABDOMINAL AND TRANSVAGINAL ULTRASOUND OF PELVIS DOPPLER ULTRASOUND OF OVARIES TECHNIQUE: Both transabdominal and transvaginal ultrasound examinations of the pelvis were performed. Transabdominal technique was performed for global imaging of the pelvis including uterus, ovaries, adnexal regions, and pelvic cul-de-sac. It was necessary to proceed with endovaginal exam following the transabdominal exam to visualize the  adnexa. Color and duplex Doppler ultrasound was utilized to evaluate blood flow to the ovaries. COMPARISON:  None. FINDINGS: Uterus Measurements: 6.0 x 3.9 x 4.4 cm = volume: There is 53.8 mL. No fibroids or other mass visualized. Endometrium Thickness: 0.3 cm.  No focal abnormality visualized. Right ovary Measurements: 2.4 x 1.4 x 2.1 cm = volume: 3.7 mL. Normal appearance/no adnexal mass. Left ovary Measurements: 2.1 x 1.2 x 1.8 cm = volume: 2.4 mL. Normal appearance/no adnexal mass. Pulsed Doppler evaluation of both ovaries demonstrates normal low-resistance arterial and venous waveforms. Other findings No abnormal free fluid. IMPRESSION: Negative for ovarian torsion.  Normal exam. Electronically Signed   By: Drusilla Kanner M.D.   On: 05/14/2019 14:49   [place] Art/ven Flow Abd Pelv Doppler  Result Date: 05/14/2019 CLINICAL DATA:  Pelvic pain. History of prior surgery for ectopic pregnancy. EXAM: TRANSABDOMINAL AND TRANSVAGINAL ULTRASOUND OF PELVIS DOPPLER ULTRASOUND OF OVARIES TECHNIQUE: Both transabdominal and transvaginal ultrasound examinations of the pelvis were performed. Transabdominal technique was performed for global imaging of the pelvis including uterus, ovaries, adnexal regions, and pelvic cul-de-sac. It was necessary to proceed with endovaginal exam following the transabdominal exam to visualize the adnexa. Color and duplex Doppler ultrasound was utilized to evaluate blood flow to the ovaries. COMPARISON:  None. FINDINGS: Uterus Measurements: 6.0 x 3.9 x 4.4 cm = volume: There is 53.8 mL. No fibroids or other mass visualized. Endometrium Thickness: 0.3 cm.  No focal abnormality visualized. Right ovary Measurements: 2.4 x 1.4 x 2.1 cm = volume: 3.7 mL. Normal appearance/no adnexal mass. Left ovary Measurements: 2.1 x 1.2 x 1.8 cm = volume: 2.4 mL. Normal appearance/no adnexal mass. Pulsed Doppler evaluation of both ovaries demonstrates normal low-resistance arterial and venous waveforms. Other  findings No abnormal free fluid. IMPRESSION: Negative for ovarian torsion.  Normal exam. Electronically Signed   By: Drusilla Kanner M.D.   On: 05/14/2019 14:49    Procedures Procedures (including critical care time)  Medications Ordered in ED Medications  cefTRIAXone (ROCEPHIN) injection 250 mg (has no administration in time range)  azithromycin (ZITHROMAX) tablet 1,000 mg (has no administration in time range)  sodium chloride flush (NS) 0.9 % injection 3 mL (3 mLs Intravenous Given 05/14/19 1251)     Initial Impression / Assessment and Plan / ED Course  I have reviewed the triage vital signs and the nursing notes.  Pertinent labs & imaging results that were available  during my care of the patient were reviewed by me and considered in my medical decision making (see chart for details).     Final Clinical Impressions(s) / ED Diagnoses   Final diagnoses:  Bacterial vaginosis  PID (acute pelvic inflammatory disease)   23 year old female presenting with pelvic pain ongoing intermittently for the last month that has been persistent for the last week.  Mild suprapubic ttp on exam. On pelvic exam she has purulent yellow discharge from the cervical os, uterine TTP, and left adnexal TTP.   CBC wnl CMP nonacute Lipase wnl UA negative for UTI Urine preg negative Wet prep + for BV HIV negative  RPR and gc/chlamydia pending   Pelvic US is WNL  I suspect pt has PID. Will tx as such with flagyl and doxycycline. Advised her to f/u with her obgyn and to return to the ed for new or worsening sxs. She voices understanding and is in agreement with plan. All questions answered, pt stable for d/c.  ED Discharge Orders         Ordered    metroNIDAZOLE (FLAGYL) 500 MG tablet  2 times daily,   Status:  Discontinued     05/14/19 1422    metroNIDAZOLE (FLAGYL) 500 MG tablet  2 times daily     05/14/19 1507    doxycycline (VIBRAMYCIN) 100 MG capsule  2 times daily     05/14/19 [address], Venancio Chenier S, PA-C 05/14/19 1532    Lacretia Leigh, MD 05/18/19 1127

## 2019-05-14 NOTE — Discharge Instructions (Addendum)
You were given a prescription for antibiotics. Please take the antibiotic prescription fully. Do not drink alcohol while in this medication.   Please follow up with your OB-GYN within 5-7 days for re-evaluation of your symptoms.   Please return to the emergency department for any new or worsening symptoms.

## 2019-05-14 NOTE — ED Triage Notes (Signed)
Pt c/o lower abd pains for several weeks with nausea, no appetite and moodiness. Reports had surgery for ectopic pregnancy back in July.

## 2019-05-15 LAB — RPR: RPR Ser Ql: NONREACTIVE

## 2019-05-16 LAB — GC/CHLAMYDIA PROBE AMP (~~LOC~~) NOT AT ARMC
Chlamydia: NEGATIVE
Neisseria Gonorrhea: NEGATIVE

## 2019-07-18 ENCOUNTER — Other Ambulatory Visit (HOSPITAL_COMMUNITY)
Admission: RE | Admit: 2019-07-18 | Discharge: 2019-07-18 | Disposition: A | Discharge status: Home / Self Care | Payer: Medicaid Other | Source: Ambulatory Visit | Attending: Student | Admitting: Student

## 2019-07-18 ENCOUNTER — Encounter: Payer: Self-pay | Admitting: Student

## 2019-07-18 ENCOUNTER — Other Ambulatory Visit: Payer: Self-pay

## 2019-07-18 ENCOUNTER — Ambulatory Visit (INDEPENDENT_AMBULATORY_CARE_PROVIDER_SITE_OTHER): Payer: Medicaid Other | Admitting: Student

## 2019-07-18 VITALS — BP 108/71 | HR 96 | Ht 60.0 in | Wt 168.8 lb

## 2019-07-18 DIAGNOSIS — Z01419 Encounter for gynecological examination (general) (routine) without abnormal findings: Secondary | ICD-10-CM | POA: Diagnosis not present

## 2019-07-18 DIAGNOSIS — Z3042 Encounter for surveillance of injectable contraceptive: Secondary | ICD-10-CM | POA: Diagnosis not present

## 2019-07-18 DIAGNOSIS — Z0000 Encounter for general adult medical examination without abnormal findings: Secondary | ICD-10-CM

## 2019-07-18 LAB — POCT PREGNANCY, URINE: Preg Test, Ur: NEGATIVE

## 2019-07-18 MED ORDER — MEDROXYPROGESTERONE ACETATE 150 MG/ML IM SUSP
150.0000 mg | Freq: Once | INTRAMUSCULAR | Status: AC
  Administered 2019-07-18: 150 mg via INTRAMUSCULAR

## 2019-07-18 NOTE — Addendum Note (Signed)
Addended by: Bethanne Ginger on: 07/18/2019 05:04 PM   Modules accepted: Orders

## 2019-07-18 NOTE — Progress Notes (Addendum)
  History:  [name] is a 23 y.o. G3P1011 who presents to clinic today for annual exam. She denies any abnormal discharge, pelvic pain, dysuria, vaginal itching or other ob-gyn complaints.   The following portions of the patient's history were reviewed and updated as appropriate: allergies, current medications, family history, past medical history, social history, past surgical history and problem list.  Review of Systems:  Review of Systems  Constitutional: Negative.   HENT: Negative.   Respiratory: Negative.   Cardiovascular: Negative.   Genitourinary: Negative.   Musculoskeletal: Negative.   Skin: Negative.   Neurological: Negative.       Objective:  Physical Exam BP 108/71   Pulse 96   Ht 5' (1.524 m)   Wt 168 lb 12.8 oz (76.6 kg)   BMI 32.97 kg/m  Physical Exam  Constitutional: She is oriented to person, place, and time. She appears well-developed.  HENT:  Head: Normocephalic.  Respiratory: Effort normal.  GI: Soft.  Genitourinary:    Vagina normal.     No vaginal discharge.     Genitourinary Comments: NEFG; no discharge or blood in the vagina; no CMT, suprapubic or adnexal tenderness.    Musculoskeletal:        General: Normal range of motion.     Cervical back: Normal range of motion.  Neurological: She is alert and oriented to person, place, and time.  Skin: Skin is warm and dry.   Breast exm benign; no lesions, masses or lumps palpated. Nontender to palpation.   Labs and Imaging Results for orders placed or performed in visit on 07/18/19 (from the past 24 hour(s))  Pregnancy, urine POC     Status: None   Collection Time: 07/18/19  1:58 PM  Result Value Ref Range   Preg Test, Ur NEGATIVE NEGATIVE    No results found.   Assessment & Plan:  1. Women's annual routine gynecological examination -Patient UPT is negative; tolerated Depo shot well.  - [facility]   [place], [place] 07/18/2019 2:22 PM

## 2019-07-20 LAB — CYTOLOGY - PAP
Chlamydia: NEGATIVE
Comment: NEGATIVE
Comment: NORMAL
Diagnosis: NEGATIVE
Neisseria Gonorrhea: NEGATIVE

## 2019-10-16 ENCOUNTER — Other Ambulatory Visit: Payer: Self-pay

## 2019-10-16 ENCOUNTER — Ambulatory Visit (INDEPENDENT_AMBULATORY_CARE_PROVIDER_SITE_OTHER): Payer: Medicaid Other | Admitting: General Practice

## 2019-10-16 VITALS — BP 123/89 | HR 90 | Ht 60.0 in | Wt 172.0 lb

## 2019-10-16 DIAGNOSIS — Z3042 Encounter for surveillance of injectable contraceptive: Secondary | ICD-10-CM | POA: Diagnosis not present

## 2019-10-16 MED ORDER — MEDROXYPROGESTERONE ACETATE 150 MG/ML IM SUSP
150.0000 mg | Freq: Once | INTRAMUSCULAR | Status: AC
  Administered 2019-10-16: 150 mg via INTRAMUSCULAR

## 2019-10-16 NOTE — Progress Notes (Signed)
Chart reviewed - agree with CMA/RN documentation.  ° °

## 2019-10-16 NOTE — Progress Notes (Signed)
[name] here for Depo-Provera  Injection.  Injection administered without complication. Patient will return in 3 months for next injection.  Marylynn Pearson, RN 10/16/2019  10:19 AM

## 2020-01-02 ENCOUNTER — Ambulatory Visit (INDEPENDENT_AMBULATORY_CARE_PROVIDER_SITE_OTHER): Payer: Medicaid Other | Admitting: *Deleted

## 2020-01-02 ENCOUNTER — Other Ambulatory Visit: Payer: Self-pay

## 2020-01-02 VITALS — BP 109/70 | HR 90 | Temp 98.0°F | Ht 62.0 in | Wt 184.6 lb

## 2020-01-02 DIAGNOSIS — Z3042 Encounter for surveillance of injectable contraceptive: Secondary | ICD-10-CM

## 2020-01-02 MED ORDER — MEDROXYPROGESTERONE ACETATE 150 MG/ML IM SUSP
150.0000 mg | Freq: Once | INTRAMUSCULAR | Status: AC
  Administered 2020-01-02: 150 mg via INTRAMUSCULAR

## 2020-01-02 NOTE — Progress Notes (Signed)
Patient was assessed and managed by nursing staff during this encounter. I have reviewed the chart and agree with the documentation and plan. I have also made any necessary editorial changes.  Joselyn Arrow, MD 01/02/2020 11:56 AM

## 2020-01-02 NOTE — Progress Notes (Signed)
Depo Provera 150 mg IM administered as scheduled. Pt tolerated well. Next injection due 8/17-8/31. Annual Gyn exam due 12/21. Pt expressed concern of family history of breast cancer. I instructed her on self breast exam monthly. Pt voiced understanding of all information and instructions given.

## 2020-02-01 DIAGNOSIS — Z419 Encounter for procedure for purposes other than remedying health state, unspecified: Secondary | ICD-10-CM | POA: Diagnosis not present

## 2020-02-26 ENCOUNTER — Telehealth: Payer: Self-pay | Admitting: Family Medicine

## 2020-02-26 NOTE — Telephone Encounter (Signed)
Patient state she woke up yesterday and her breast was really swollen, she woke up this morning and she said its still swollen and very heavy, want to speak to someone about this

## 2020-02-26 NOTE — Telephone Encounter (Signed)
1307  Pt left additional message on nurse VM questioning what to do about the problem of her Lt breast. Per consult w/Dr. Alysia Penna, pt needs provider visit within 1 week.  I returned call to pt and offered appt in office on 8/5 @ 1015 and she accepted. Pt stated that she has some interittent pain in the Lt breast as well. She was advised to try ibuprofen or tylenol per package directions. Pt voiced understanding of all information and instructions given.

## 2020-02-29 ENCOUNTER — Other Ambulatory Visit: Payer: Self-pay

## 2020-02-29 ENCOUNTER — Encounter (HOSPITAL_COMMUNITY): Payer: Self-pay | Admitting: Emergency Medicine

## 2020-02-29 ENCOUNTER — Emergency Department (HOSPITAL_COMMUNITY)
Admission: EM | Admit: 2020-02-29 | Discharge: 2020-03-01 | Disposition: A | Discharge status: Home / Self Care | Payer: Medicaid Other | Attending: Emergency Medicine | Admitting: Emergency Medicine

## 2020-02-29 DIAGNOSIS — R0789 Other chest pain: Secondary | ICD-10-CM | POA: Diagnosis not present

## 2020-02-29 DIAGNOSIS — N644 Mastodynia: Secondary | ICD-10-CM | POA: Insufficient documentation

## 2020-02-29 LAB — CBC WITH DIFFERENTIAL/PLATELET
Abs Immature Granulocytes: 0.07 10*3/uL (ref 0.00–0.07)
Basophils Absolute: 0.1 10*3/uL (ref 0.0–0.1)
Basophils Relative: 0 %
Eosinophils Absolute: 0.1 10*3/uL (ref 0.0–0.5)
Eosinophils Relative: 1 %
HCT: 41.2 % (ref 36.0–46.0)
Hemoglobin: 13.1 g/dL (ref 12.0–15.0)
Immature Granulocytes: 1 %
Lymphocytes Relative: 25 %
Lymphs Abs: 3.7 10*3/uL (ref 0.7–4.0)
MCH: 28.2 pg (ref 26.0–34.0)
MCHC: 31.8 g/dL (ref 30.0–36.0)
MCV: 88.8 fL (ref 80.0–100.0)
Monocytes Absolute: 1.1 10*3/uL — ABNORMAL HIGH (ref 0.1–1.0)
Monocytes Relative: 8 %
Neutro Abs: 9.5 10*3/uL — ABNORMAL HIGH (ref 1.7–7.7)
Neutrophils Relative %: 65 %
Platelets: 345 10*3/uL (ref 150–400)
RBC: 4.64 MIL/uL (ref 3.87–5.11)
RDW: 12.4 % (ref 11.5–15.5)
WBC: 14.5 10*3/uL — ABNORMAL HIGH (ref 4.0–10.5)
nRBC: 0 % (ref 0.0–0.2)

## 2020-02-29 LAB — BASIC METABOLIC PANEL
Anion gap: 11 (ref 5–15)
BUN: 17 mg/dL (ref 6–20)
CO2: 21 mmol/L — ABNORMAL LOW (ref 22–32)
Calcium: 9.6 mg/dL (ref 8.9–10.3)
Chloride: 107 mmol/L (ref 98–111)
Creatinine, Ser: 1.09 mg/dL — ABNORMAL HIGH (ref 0.44–1.00)
GFR calc Af Amer: >60 mL/min (ref >60)
GFR calc non Af Amer: >60 mL/min (ref >60)
Glucose, Bld: 101 mg/dL — ABNORMAL HIGH (ref 70–99)
Potassium: 3.6 mmol/L (ref 3.5–5.1)
Sodium: 139 mmol/L (ref 135–145)

## 2020-02-29 NOTE — ED Triage Notes (Signed)
Patient reports left breast swelling with tenderness/redness and clear drainage onset Sunday , denies injury , no fever or chills .

## 2020-03-01 ENCOUNTER — Emergency Department (HOSPITAL_COMMUNITY): Payer: Medicaid Other

## 2020-03-01 DIAGNOSIS — R0789 Other chest pain: Secondary | ICD-10-CM | POA: Diagnosis not present

## 2020-03-01 LAB — I-STAT BETA HCG BLOOD, ED (MC, WL, AP ONLY): I-stat hCG, quantitative: <5 m[IU]/mL (ref <5)

## 2020-03-01 MED ORDER — OXYCODONE-ACETAMINOPHEN 5-325 MG PO TABS
1.0000 | ORAL_TABLET | Freq: Once | ORAL | Status: AC
  Administered 2020-03-01: 1 via ORAL
  Filled 2020-03-01: qty 1

## 2020-03-01 MED ORDER — DOXYCYCLINE HYCLATE 100 MG PO CAPS: 100.0000 mg | ORAL_CAPSULE | Freq: Two times a day (BID) | ORAL | 0 refills | Status: AC

## 2020-03-01 NOTE — ED Notes (Signed)
Pt is here with left breast pain that started on Sunday.  Pt reports pain is intermittent.  Reported some clear discharge.  No breastfeeding.  Pt is on the depo shot and states that she is having vaginal bleeding since Sunday as well.

## 2020-03-01 NOTE — ED Notes (Signed)
Patient transported to X-ray 

## 2020-03-01 NOTE — Discharge Instructions (Signed)
Recommend keeping your appointment next week with your primary doctor for close recheck.  Recommend taking antibiotics and using warm compresses on the affected area.  If you develop worsening swelling, pain, fever, other concerns symptom, please return to ER for reassessment.

## 2020-03-01 NOTE — ED Provider Notes (Addendum)
[facility] EMERGENCY DEPARTMENT Provider Note   CSN: [id number] Arrival date & time: 02/29/20  2209     History Chief Complaint  Patient presents with  . Breast Swelling    Sonya Frazier is a 24 y.o. female.  Presents ER with concern for left breast swelling, pain.  Patient reports been ongoing since Sunday, seems to be steadily worsening.  Initially noted mild amount of swelling, also thinks she has had some clear drainage from the area, some redness.  Denies any chronic medical conditions, no family history CAD.  No recent pregnancy, ectopic in June 2020.  HPI     Past Medical History:  Diagnosis Date  . Anemia   . Medical history non-contributory   . Preeclampsia     Patient Active Problem List   Diagnosis Date Noted  . COVID-19 virus infection 01/19/2019  . Ectopic pregnancy, tubal 01/13/2019  . S/P C-section 09/10/2017    Past Surgical History:  Procedure Laterality Date  . CESAREAN SECTION N/A 09/10/2017   Procedure: CESAREAN SECTION;  Surgeon: Lazaro Arms, MD;  Location: [facility] BIRTHING SUITES;  Service: Obstetrics;  Laterality: N/A;  . LAPAROSCOPIC UNILATERAL SALPINGECTOMY Right 01/13/2019   Procedure: LAPAROSCOPIC UNILATERAL SALPINGECTOMY;  Surgeon: Lazaro Arms, MD;  Location: [facility] OR;  Service: Gynecology;  Laterality: Right;  . NO PAST SURGERIES    . WISDOM TOOTH EXTRACTION       OB History    Gravida  3   Para  1   Term  1   Preterm  0   AB  1   Living  1     SAB  1   TAB  0   Ectopic  0   Multiple  0   Live Births  1           Family History  Problem Relation Age of Onset  . Asthma Mother   . Cancer Maternal Aunt     Social History   Tobacco Use  . Smoking status: Never Smoker  . Smokeless tobacco: Never Used  Substance Use Topics  . Alcohol use: No    Comment: occ  . Drug use: No    Types: Marijuana    Comment: Quit approx 1 year ago    Home Medications Prior to Admission medications    Medication Sig Start Date End Date Taking? Authorizing Provider  acetaminophen (TYLENOL) 325 MG tablet Take 650 mg by mouth every 6 (six) hours as needed for moderate pain.   Yes [provider]  medroxyPROGESTERone (DEPO-PROVERA) 150 MG/ML injection Inject 1 mL (150 mg total) into the muscle every 3 (three) months. 01/20/18  Yes Adam Phenix, MD  doxycycline (VIBRAMYCIN) 100 MG capsule Take 1 capsule (100 mg total) by mouth 2 (two) times daily for 7 days. 03/01/20 03/08/20  Milagros Loll, MD    Allergies    Patient has no known allergies.  Review of Systems   Review of Systems  Constitutional: Negative for chills and fever.  HENT: Negative for ear pain and sore throat.   Eyes: Negative for pain and visual disturbance.  Respiratory: Negative for cough and shortness of breath.   Cardiovascular: Negative for chest pain and palpitations.  Gastrointestinal: Negative for abdominal pain and vomiting.  Genitourinary: Negative for dysuria and hematuria.  Musculoskeletal: Negative for arthralgias and back pain.  Skin: Negative for color change and rash.  Neurological: Negative for seizures and syncope.  All other systems reviewed and are negative.  Physical Exam Updated Vital Signs BP 109/65 (BP Location: Left Arm)   Pulse 76   Temp 98.7 F (37.1 C) (Oral)   Resp 16   Ht 5' (1.524 m)   Wt 90 kg   SpO2 100%   BMI 38.75 kg/m   Physical Exam Vitals and nursing note reviewed.  Constitutional:      General: She is not in acute distress.    Appearance: She is well-developed.  HENT:     Head: Normocephalic and atraumatic.  Eyes:     Conjunctiva/sclera: Conjunctivae normal.  Cardiovascular:     Rate and Rhythm: Normal rate and regular rhythm.     Heart sounds: No murmur heard.   Pulmonary:     Effort: Pulmonary effort is normal. No respiratory distress.     Breath sounds: Normal breath sounds.  Chest:     Comments: RN chaperone Left breast appears grossly normal,  there is no palpable mass, no swelling, no induration, no overlying erythema, there is some TTP however in left lateral breast tissue and left anterior/lateral chest wall Abdominal:     Palpations: Abdomen is soft.     Tenderness: There is no abdominal tenderness.  Musculoskeletal:     Cervical back: Neck supple.  Skin:    General: Skin is warm and dry.  Neurological:     Mental Status: She is alert.     ED Results / Procedures / Treatments   Labs (all labs ordered are listed, but only abnormal results are displayed) Labs Reviewed  CBC WITH DIFFERENTIAL/PLATELET - Abnormal; Notable for the following components:      Result Value   WBC 14.5 (*)    Neutro Abs 9.5 (*)    Monocytes Absolute 1.1 (*)    All other components within normal limits  BASIC METABOLIC PANEL - Abnormal; Notable for the following components:   CO2 21 (*)    Glucose, Bld 101 (*)    Creatinine, Ser 1.09 (*)    All other components within normal limits  I-STAT BETA HCG BLOOD, ED ([facility], [facility], [facility] ONLY)    EKG None  Radiology DG Chest 2 View  Result Date: 03/01/2020 CLINICAL DATA:  Left chest wall tenderness. EXAM: CHEST - 2 VIEW COMPARISON:  Chest x-ray 10/18/2018 FINDINGS: Heart size is normal. Lungs are clear. No edema or effusion is present. Visualized soft tissues and bony thorax are unremarkable. IMPRESSION: Negative two view chest x-ray Electronically Signed   By: Marin Roberts M.D.   On: 03/01/2020 09:03    Procedures Procedures (including critical care time)  Medications Ordered in ED Medications  oxyCODONE-acetaminophen (PERCOCET/ROXICET) 5-325 MG per tablet 1 tablet (1 tablet Oral Given 03/01/20 6720)    ED Course  I have reviewed the triage vital signs and the nursing notes.  Pertinent labs & imaging results that were available during my care of the patient were reviewed by me and considered in my medical decision making (see chart for details).    MDM Rules/Calculators/A&P                           24 year old lady presenting to ER with concern for left breast pain, swelling. She is well-appearing with normal vital signs, afebrile.  On physical exam, do not appreciate any discrete abscess, no cellulitis.  CXR clear. Noted leukocytosis but normal diff, afebrile and no systemic symptoms. Based on symptomatology, concern this may be very early abscess. Will recommend warm compresses and course  of abx. Has f/u appt with primary already scheduled for next week. Recommended keeping this appt, return ER for worsening pain, swelling, redness or drainage.     After the discussed management above, the patient was determined to be safe for discharge.  The patient was in agreement with this plan and all questions regarding their care were answered.  ED return precautions were discussed and the patient will return to the ED with any significant worsening of condition.  Final Clinical Impression(s) / ED Diagnoses Final diagnoses:  Breast pain, left    Rx / DC Orders ED Discharge Orders         Ordered    doxycycline (VIBRAMYCIN) 100 MG capsule  2 times daily     Discontinue  Reprint     03/01/20 0909           Milagros Loll, MD 03/01/20 8101    Milagros Loll, MD 03/01/20 [phone]

## 2020-03-03 DIAGNOSIS — Z419 Encounter for procedure for purposes other than remedying health state, unspecified: Secondary | ICD-10-CM | POA: Diagnosis not present

## 2020-03-07 ENCOUNTER — Ambulatory Visit (INDEPENDENT_AMBULATORY_CARE_PROVIDER_SITE_OTHER): Payer: Medicaid Other | Admitting: Obstetrics and Gynecology

## 2020-03-07 ENCOUNTER — Encounter: Payer: Self-pay | Admitting: Obstetrics and Gynecology

## 2020-03-07 ENCOUNTER — Other Ambulatory Visit: Payer: Self-pay

## 2020-03-07 VITALS — BP 123/84 | HR 83 | Wt 185.6 lb

## 2020-03-07 DIAGNOSIS — Z803 Family history of malignant neoplasm of breast: Secondary | ICD-10-CM | POA: Diagnosis not present

## 2020-03-07 DIAGNOSIS — N644 Mastodynia: Secondary | ICD-10-CM | POA: Diagnosis not present

## 2020-03-07 DIAGNOSIS — R7989 Other specified abnormal findings of blood chemistry: Secondary | ICD-10-CM

## 2020-03-07 DIAGNOSIS — N643 Galactorrhea not associated with childbirth: Secondary | ICD-10-CM

## 2020-03-07 NOTE — Progress Notes (Signed)
Breast U/S scheduled [facility] for August 18th @ 0850.  Pt notified.

## 2020-03-07 NOTE — Progress Notes (Signed)
Obstetrics and Gynecology Visit Return Patient Evaluation  Appointment Date: 03/07/2020  Primary Care Provider: No primary care provider on file.  OBGYN Clinic: [facility]  Chief Complaint: left breast pain and h/o clear nipple discharge.   History of Present Illness:  Sonya Frazier is a 24 y.o. P1 with the above CC.   Left breast pain: Started two sundays ago (approximatley 7/18) and it's on the left side on the lateral side, near the [place] and it's sharp and can last several minutes. S/s feel better vs stable then when it started. She also noted clear discharge from the left nipple that occurred in the shower 2-3 times that looked like it came from discrete openings. Skin has always been normal and she notes no lumps or bumps  She went to the ED for this on 7/30 and their exam was normal except for some tenderness in the area that she notes above and prescribed her doxycycline which she is taking. Her beta hcg was neg and her wbc was a little elevated at 14.5 and Cr 1.09  AUB: she also notes AUB that started with the nipple discomfort; AUB stopped today.   She is currently on depo provera with last injection on 01/02/2020  Review of Systems: as noted in the History of Present Illness.  Medications:  [name] had no medications administered during this visit. Current Outpatient Medications  Medication Sig Dispense Refill  . acetaminophen (TYLENOL) 325 MG tablet Take 650 mg by mouth every 6 (six) hours as needed for moderate pain.    Marland Kitchen doxycycline (VIBRAMYCIN) 100 MG capsule Take 1 capsule (100 mg total) by mouth 2 (two) times daily for 7 days. 14 capsule 0  . medroxyPROGESTERone (DEPO-PROVERA) 150 MG/ML injection Inject 1 mL (150 mg total) into the muscle every 3 (three) months. 1 mL 3   No current facility-administered medications for this visit.    Allergies: has No Known Allergies.  Physical Exam:  BP 123/84   Pulse 83    Wt 185 lb 9.6 oz (84.2 kg)   BMI 36.25 kg/m  Body mass index is 36.25 kg/m. General appearance: Well nourished, well developed female in no acute distress.  Breast: normal exam on inspection and palpation with no tenderness noted on either side. Left side looks a little bigger than the right and she states the right is usually larger. No nipple discharge with expression.   Labs As per HPI CBC Latest Ref Rng & Units 02/29/2020 05/14/2019 01/13/2019  WBC 4.0 - 10.5 K/uL 14.5(H) 9.2 9.5  Hemoglobin 12.0 - 15.0 g/dL 90.2 40.9 73.5  Hematocrit 36 - 46 % 41.2 45.9 36.6  Platelets 150 - 400 K/uL 345 332 313   BMP Latest Ref Rng & Units 02/29/2020 05/14/2019 10/18/2018  Glucose 70 - 99 mg/dL 329(J) 90 97  BUN 6 - 20 mg/dL 17 14 18   Creatinine 0.44 - 1.00 mg/dL ) 2.42(A 8.34  Sodium 135 - 145 mmol/L 139 138 137  Potassium 3.5 - 5.1 mmol/L 3.6 3.6 3.6  Chloride 98 - 111 mmol/L 107 107 107  CO2 22 - 32 mmol/L 21(L) 22 21(L)  Calcium 8.9 - 10.3 mg/dL 9.6 1.96 9.3   Assessment: pt doing well  Plan:  1. Breast pain, left Follow up labs and left breast ultrasound. Can see if patient meets criteria for genetic counseling, referral for hereditary testing after results.  - 22.2 BREAST COMPLETE UNI LEFT INC AXILLA; Future  2. Galactorrhea of  left breast - TSH - Prolactin  3. Elevated serum creatinine - Basic metabolic panel  RTC: PRN  Cornelia Copa MD Attending Center for [facility])

## 2020-03-08 LAB — BASIC METABOLIC PANEL
BUN/Creatinine Ratio: 10 (ref 9–23)
BUN: 9 mg/dL (ref 6–20)
CO2: 20 mmol/L (ref 20–29)
Calcium: 9.8 mg/dL (ref 8.7–10.2)
Chloride: 104 mmol/L (ref 96–106)
Creatinine, Ser: 0.9 mg/dL (ref 0.57–1.00)
GFR calc Af Amer: 103 mL/min/{1.73_m2} (ref >59)
GFR calc non Af Amer: 90 mL/min/{1.73_m2} (ref >59)
Glucose: 77 mg/dL (ref 65–99)
Potassium: 4 mmol/L (ref 3.5–5.2)
Sodium: 139 mmol/L (ref 134–144)

## 2020-03-08 LAB — TSH: TSH: 1.74 u[IU]/mL (ref 0.450–4.500)

## 2020-03-08 LAB — PROLACTIN: Prolactin: 9.2 ng/mL (ref 4.8–23.3)

## 2020-03-19 ENCOUNTER — Ambulatory Visit: Payer: Medicaid Other

## 2020-03-20 ENCOUNTER — Ambulatory Visit
Admission: RE | Admit: 2020-03-20 | Discharge: 2020-03-20 | Disposition: A | Discharge status: Home / Self Care | Payer: Medicaid Other | Source: Ambulatory Visit | Attending: Obstetrics and Gynecology | Admitting: Obstetrics and Gynecology

## 2020-03-20 ENCOUNTER — Other Ambulatory Visit: Payer: Self-pay

## 2020-03-20 ENCOUNTER — Inpatient Hospital Stay: Admission: RE | Admit: 2020-03-20 | Payer: Medicaid Other | Source: Ambulatory Visit

## 2020-03-20 DIAGNOSIS — N644 Mastodynia: Secondary | ICD-10-CM | POA: Diagnosis not present

## 2020-04-03 DIAGNOSIS — Z419 Encounter for procedure for purposes other than remedying health state, unspecified: Secondary | ICD-10-CM | POA: Diagnosis not present

## 2020-04-04 ENCOUNTER — Ambulatory Visit (INDEPENDENT_AMBULATORY_CARE_PROVIDER_SITE_OTHER): Payer: Medicaid Other | Admitting: *Deleted

## 2020-04-04 ENCOUNTER — Other Ambulatory Visit: Payer: Self-pay

## 2020-04-04 ENCOUNTER — Encounter: Payer: Self-pay | Admitting: Family Medicine

## 2020-04-04 ENCOUNTER — Encounter: Payer: Self-pay | Admitting: *Deleted

## 2020-04-04 VITALS — BP 117/74 | HR 72 | Ht 60.0 in | Wt 187.4 lb

## 2020-04-04 DIAGNOSIS — Z3042 Encounter for surveillance of injectable contraceptive: Secondary | ICD-10-CM | POA: Diagnosis not present

## 2020-04-04 MED ORDER — MEDROXYPROGESTERONE ACETATE 150 MG/ML IM SUSP
150.0000 mg | Freq: Once | INTRAMUSCULAR | Status: AC
  Administered 2020-04-04: 150 mg via INTRAMUSCULAR

## 2020-04-04 NOTE — Progress Notes (Signed)
Depo provera 150 mg IM administered as scheduled. Pt tolerated well. Next inj due 11/18-12/2. Annual exam due after 07/17/20. Pt voiced understanding of information given.

## 2020-04-04 NOTE — Progress Notes (Signed)
Patient was assessed and managed by nursing staff during this encounter. I have reviewed the chart and agree with the documentation and plan. I have also made any necessary editorial changes.  Marylen Ponto, NP 04/04/2020 10:08 AM

## 2020-05-03 DIAGNOSIS — Z419 Encounter for procedure for purposes other than remedying health state, unspecified: Secondary | ICD-10-CM | POA: Diagnosis not present

## 2020-05-15 ENCOUNTER — Inpatient Hospital Stay (HOSPITAL_COMMUNITY)
Admission: AD | Admit: 2020-05-15 | Discharge: 2020-05-15 | Discharge status: Against Medical Advice | Payer: Medicaid Other | Attending: Family Medicine | Admitting: Family Medicine

## 2020-05-15 ENCOUNTER — Other Ambulatory Visit: Payer: Self-pay

## 2020-05-15 DIAGNOSIS — Z5321 Procedure and treatment not carried out due to patient leaving prior to being seen by health care provider: Secondary | ICD-10-CM | POA: Insufficient documentation

## 2020-05-15 LAB — URINALYSIS, ROUTINE W REFLEX MICROSCOPIC
Bilirubin Urine: NEGATIVE
Glucose, UA: NEGATIVE mg/dL
Ketones, ur: NEGATIVE mg/dL
Nitrite: NEGATIVE
Protein, ur: NEGATIVE mg/dL
Specific Gravity, Urine: 1.026 (ref 1.005–1.030)
pH: 6 (ref 5.0–8.0)

## 2020-05-15 LAB — HIV ANTIBODY (ROUTINE TESTING W REFLEX): HIV Screen 4th Generation wRfx: NONREACTIVE

## 2020-05-15 LAB — CBC
HCT: 40.4 % (ref 36.0–46.0)
Hemoglobin: 13.3 g/dL (ref 12.0–15.0)
MCH: 29 pg (ref 26.0–34.0)
MCHC: 32.9 g/dL (ref 30.0–36.0)
MCV: 88.2 fL (ref 80.0–100.0)
Platelets: 348 10*3/uL (ref 150–400)
RBC: 4.58 MIL/uL (ref 3.87–5.11)
RDW: 11.9 % (ref 11.5–15.5)
WBC: 14.1 10*3/uL — ABNORMAL HIGH (ref 4.0–10.5)
nRBC: 0 % (ref 0.0–0.2)

## 2020-05-15 LAB — POCT PREGNANCY, URINE: Preg Test, Ur: NEGATIVE

## 2020-05-15 LAB — HCG, QUANTITATIVE, PREGNANCY: hCG, Beta Chain, Quant, S: 1 m[IU]/mL (ref <5)

## 2020-05-15 NOTE — MAU Note (Signed)
Front desk called back to MAU. Pt asking how much longer before she gets a room. Sonya Frazier RNC CN brought pt to Triage and explained I could not tell her how long before a bed is available. Pt made aware her upt and blood work show she is not pregnant currently. She states she needs to leave to pick up her child and will make appt to see her doctor tomorrow. AMA form signed.

## 2020-05-15 NOTE — MAU Note (Signed)
Had Depo Sept 2 2021. Unsure of last LMP. For couple of wks having lower back pain, lower abd pain, vomiting,spotting. Had faint upt couple wks ago. All of the pain is intermittent. Took Tylenol 2 reg st this am and did not help

## 2020-06-03 DIAGNOSIS — Z419 Encounter for procedure for purposes other than remedying health state, unspecified: Secondary | ICD-10-CM | POA: Diagnosis not present

## 2020-06-19 ENCOUNTER — Telehealth: Payer: Self-pay | Admitting: *Deleted

## 2020-06-19 NOTE — Telephone Encounter (Signed)
Received call on 11/15 from Rocky Link, RN (from RN call service). She stated that pt is calling in to report that she had vaginal bleeding w/clots last month - now just spotting. Pt also reports incidence of spontaneous nose bleeds. Lupita Leash wanted to know if pt needed to be seen sooner than scheduled appt on 11/18 for Depo Provera injection. I advised that irregular bleeding can be normal while taking Depo Provera. Since her bleeding has reduced to spotting, pt can keep appt on 11/18 as scheduled. Lupita Leash then expressed concern about the nose bleed and wondered if this would prompt a sooner visit. I stated that the nose bleed would not be related to Depo Provera. She can be given standard nursing advice for care of nose bleed and may discuss further during appt on 11/18. Lupita Leash voiced understanding and will provide advice and information to pt.

## 2020-06-20 ENCOUNTER — Ambulatory Visit: Payer: Medicaid Other

## 2020-07-03 DIAGNOSIS — Z419 Encounter for procedure for purposes other than remedying health state, unspecified: Secondary | ICD-10-CM | POA: Diagnosis not present

## 2020-08-03 DIAGNOSIS — Z419 Encounter for procedure for purposes other than remedying health state, unspecified: Secondary | ICD-10-CM | POA: Diagnosis not present

## 2020-08-09 ENCOUNTER — Other Ambulatory Visit: Payer: Self-pay

## 2020-08-09 ENCOUNTER — Ambulatory Visit (INDEPENDENT_AMBULATORY_CARE_PROVIDER_SITE_OTHER): Payer: Medicaid Other | Admitting: General Practice

## 2020-08-09 DIAGNOSIS — Z3202 Encounter for pregnancy test, result negative: Secondary | ICD-10-CM

## 2020-08-09 LAB — POCT PREGNANCY, URINE: Preg Test, Ur: NEGATIVE

## 2020-08-09 NOTE — Progress Notes (Signed)
Patient dropped off urine sample at office for pregnancy test. UPT negative.  Called patient at home and she reports positive test at home over last weekend. She reports did start spotting today. Patient last received depo in September and would like to become pregnant. Patient is concerned about future pregnancies given history of ectopic pregnancy. Advised at this time her UPT is negative and she has started bleeding which is reassuring that her periods may be starting back. Discussed with patient if she wanted to return in a week for another UPT she could. Recommended she begin taking PNV since she desires pregnancy. Patient verbalized understanding.  Chase Caller RN BSN 08/09/20

## 2020-09-03 DIAGNOSIS — Z419 Encounter for procedure for purposes other than remedying health state, unspecified: Secondary | ICD-10-CM | POA: Diagnosis not present

## 2020-10-01 DIAGNOSIS — Z419 Encounter for procedure for purposes other than remedying health state, unspecified: Secondary | ICD-10-CM | POA: Diagnosis not present

## 2020-10-09 ENCOUNTER — Telehealth: Payer: Self-pay | Admitting: Family Medicine

## 2020-10-09 ENCOUNTER — Telehealth: Payer: Self-pay | Admitting: Lactation Services

## 2020-10-09 NOTE — Telephone Encounter (Signed)
Patient reports she started her period on 3/4 and feels like she is passing a lot of blood. She was changing her pads about every 3 hours and was saturated and was noted to have quarter sized to larger clots with each pad change. She reports this has been the worst one and that the last cycle was worse than her normal also.    She is nauseous and lightheaded and has fainted once a few days ago and then felt lightheaded. She is feeling better today. She has had some nausea for the last 3 days with one episode of vomiting yesterday. She has not been around anyone who has been sick. She is not sure that she was recently pregnant. She is having unprotected sex and is not on birth control. Advised she may want to take a home pregnancy test or come in and leave a urine for a pregnancy test since this cycle was so abnormal for her.   Advised patient it is warranted that she be scheduled for follow up with a provider since her cycles seem to have changed recently.   She denies taking any medications at this time.   Reviewed with patient not to stand up quickly and to drink before standing. Reviewed seeking care at the ED or Urgent Care for loss of consciousness, especially with injury and in the ED if soaking more that 1 pad an hour for more than 2 hours. Patient voiced understanding.

## 2020-10-10 ENCOUNTER — Ambulatory Visit (INDEPENDENT_AMBULATORY_CARE_PROVIDER_SITE_OTHER): Payer: Medicaid Other | Admitting: Obstetrics and Gynecology

## 2020-10-10 ENCOUNTER — Other Ambulatory Visit: Payer: Self-pay

## 2020-10-10 ENCOUNTER — Encounter: Payer: Self-pay | Admitting: Obstetrics and Gynecology

## 2020-10-10 VITALS — BP 121/76 | HR 86 | Ht 60.0 in | Wt 195.5 lb

## 2020-10-10 DIAGNOSIS — N920 Excessive and frequent menstruation with regular cycle: Secondary | ICD-10-CM | POA: Diagnosis not present

## 2020-10-10 DIAGNOSIS — N946 Dysmenorrhea, unspecified: Secondary | ICD-10-CM | POA: Diagnosis not present

## 2020-10-10 DIAGNOSIS — R791 Abnormal coagulation profile: Secondary | ICD-10-CM

## 2020-10-10 LAB — POCT PREGNANCY, URINE: Preg Test, Ur: NEGATIVE

## 2020-10-10 MED ORDER — NAPROXEN 500 MG PO TABS: ORAL_TABLET | ORAL | 2 refills | Status: DC

## 2020-10-10 MED ORDER — MEDROXYPROGESTERONE ACETATE 10 MG PO TABS: ORAL_TABLET | ORAL | 1 refills | Status: DC

## 2020-10-10 NOTE — Progress Notes (Signed)
Obstetrics and Gynecology Visit Return Patient Evaluation  Appointment Date: 10/10/2020  [facility]  Chief Complaint: dysmenorrhea and menorrhagia  History of Present Illness:  Sonya Frazier is a 25 y.o. P1 with above CC.  Patient last had depo provera on 9/1 and stopped b/c of weight gain. She states she was usually amenorrheic on it but had aub all throughout October and November and then went back to Sonya Frazier, regular, 5 day periods but they are painful and heavy for the first 3 days and sometimes gets dizzy and lightheaded and has occasional nosebleeds.  LMP just finishing  She is okay with getting pregnant.   Pap negative 07/2019  Review of Systems:  as noted in the History of Present Illness.  Medications:  Sonya Frazier had no medications administered during this visit. Current Outpatient Medications  Medication Sig Dispense Refill   acetaminophen (TYLENOL) 325 MG tablet Take 650 mg by mouth every 6 (six) hours as needed for moderate pain.     No current facility-administered medications for this visit.    Allergies: has No Known Allergies.  Physical Exam:  BP 121/76    Pulse 86    Ht 5' (1.524 m)    Wt 195 lb 8 oz (88.7 kg)    LMP 10/04/2020 (Exact Date)    BMI 38.18 kg/m  Body mass index is 38.18 kg/m. General appearance: Well nourished, well developed female in no acute distress.  Neuro/Psych:  Normal mood and affect.    Assessment: pt stable  Plan:  1. Dysmenorrhea Patient is self pay. I d/w her re: going to the HD for sliding scale fee options. Her periods are very regular and I recommend taking naproxen the day before, when she has cramping or the the day of her period and for the next three days and to also do this with provera. I also recommend starting a women's MVI with folic acid.   She has a h/o ectopic so I told her if she ever has a +UPT to call us asap to have [place] follow her closely.   - CBC - Von Willebrand panel - Protime-INR; Future - APTT  2. Menorrhagia with regular cycle - CBC - Von Willebrand panel - Protime-INR; Future - APTT  RTC: PRN  Cornelia Copa MD Attending Center for [facility])

## 2020-10-10 NOTE — Telephone Encounter (Signed)
error 

## 2020-10-10 NOTE — Progress Notes (Signed)
Pt took 2 pregnancy test last month, the urine test said Negative but the digital showed positive.

## 2020-10-11 LAB — CBC
Hematocrit: 37.7 % (ref 34.0–46.6)
Hemoglobin: 12.8 g/dL (ref 11.1–15.9)
MCH: 29.4 pg (ref 26.6–33.0)
MCHC: 34 g/dL (ref 31.5–35.7)
MCV: 87 fL (ref 79–97)
Platelets: 311 10*3/uL (ref 150–450)
RBC: 4.36 x10E6/uL (ref 3.77–5.28)
RDW: 12.1 % (ref 11.7–15.4)
WBC: 7.9 10*3/uL (ref 3.4–10.8)

## 2020-10-11 LAB — COAG STUDIES INTERP REPORT

## 2020-10-11 LAB — VON WILLEBRAND PANEL
Factor VIII Activity: 277 % — ABNORMAL HIGH (ref 56–140)
Von Willebrand Ag: 248 % — ABNORMAL HIGH (ref 50–200)
Von Willebrand Factor: 58 % (ref 50–200)

## 2020-10-11 LAB — APTT: aPTT: 27 s (ref 24–33)

## 2020-10-15 ENCOUNTER — Telehealth: Payer: Self-pay

## 2020-10-15 NOTE — Telephone Encounter (Addendum)
Returned patients call. She is wanting to know her lab results.   Per chart review, labs have not been reviewed by provider. Reviewed with patient that Doctor needs to review and then we will reach out to her with results.   Patient would like to know why she is having difficulty getting pregnant. She reports she has been trying for a while and did not come in to get her Depo in September to be able to get pregnant. Will route to Dr. Vergie Living for review and advisement.

## 2020-10-15 NOTE — Telephone Encounter (Signed)
Pt left VM on nurse line requesting a call regarding results from recent visit.

## 2020-10-22 NOTE — Addendum Note (Signed)
Addended by: Dozier Bing on: 10/22/2020 11:08 AM   Modules accepted: Orders

## 2020-10-25 ENCOUNTER — Telehealth: Payer: Self-pay | Admitting: Physician Assistant

## 2020-10-25 NOTE — Telephone Encounter (Signed)
Received a new hem referral from Dr. Ambrose Mantle for Von Willebrand's disease. Pt has been cld and sheduled to see Karena Addison on 3/30 at 9am. Pt aware to arrive 20 minutes early.

## 2020-10-29 ENCOUNTER — Telehealth: Payer: Self-pay | Admitting: Lactation Services

## 2020-10-29 NOTE — Telephone Encounter (Signed)
Patient called and left message that she is asking why her appointment has been cancelled and what is going on with her blood work. Per chart reviewed patient's appointment with [facility] has been cancelled.    Returned patients call. She reports the Cancer Center called her and cancelled her appointment yesterday, they did not give her a reason nor was she rescheduled. Patient is also calling wanting results of her blood work. Did inform her that Von Willibrand testing did come back elevated and Dr. Vergie Living wanted her evaluated at the [facility]   Called the [facility] to inquire about cancelled appointment. Was informed by Sue Lush that appointment has been cancelled as felt not to be necessary and a message was sent to Dr. Vergie Living.   Will reach out to Dr. Vergie Living for advisement.

## 2020-10-30 ENCOUNTER — Inpatient Hospital Stay: Payer: Medicaid Other | Admitting: Physician Assistant

## 2020-10-30 ENCOUNTER — Inpatient Hospital Stay: Payer: Medicaid Other

## 2020-11-01 DIAGNOSIS — Z419 Encounter for procedure for purposes other than remedying health state, unspecified: Secondary | ICD-10-CM | POA: Diagnosis not present

## 2020-11-01 NOTE — Telephone Encounter (Signed)
If they aren't willing to see her, please make an appointment with Hematology at [facility], and let the patient know this. Thanks

## 2020-11-04 ENCOUNTER — Telehealth: Payer: Self-pay | Admitting: *Deleted

## 2020-11-04 DIAGNOSIS — R791 Abnormal coagulation profile: Secondary | ICD-10-CM

## 2020-11-04 NOTE — Telephone Encounter (Signed)
Pt left VM message requesting test results.  

## 2020-11-04 NOTE — Telephone Encounter (Signed)
Returned patients call. Patient would like results from blood work. Reviewed her Von Willibrand did come back abnormal and Dr. Vergie Living would like for her to see Hemotology at [facility]. Reviewed I have called hem twice today to try and schedule and left patient identifiers for them to call the patient to schedule. Reviewed that I have left a message with scheduler to call patient to scheduled. Will follow up on Wednesday to see if patient has been scheduled.

## 2020-11-04 NOTE — Telephone Encounter (Signed)
Called [facility] at [facility] at [phone] to schedule for Hemotology. LM on scheduling voicemail to get patient scheduled for Hemotology appt. Asked them to call patient to scheduled or to call the office at [phone] if there are any questions.

## 2020-11-04 NOTE — Telephone Encounter (Signed)
[facility] to schedule a Hemotology appointment. LM for Rose Fillers to call the office for follow up or call the patient to schedule.

## 2020-11-06 NOTE — Addendum Note (Signed)
Addended by: Ed Blalock on: 11/06/2020 08:32 AM   Modules accepted: Orders

## 2020-11-06 NOTE — Telephone Encounter (Signed)
Called Melissa at [facility] at her request. Reviewed patient needs to be seen for abnormal Von Willibrand panel. New referral placed at her request. She will reach out to patient to schedule.

## 2020-11-15 ENCOUNTER — Inpatient Hospital Stay: Payer: Medicaid Other

## 2020-11-15 ENCOUNTER — Inpatient Hospital Stay: Payer: Medicaid Other | Attending: Oncology | Admitting: Oncology

## 2020-11-15 ENCOUNTER — Encounter (INDEPENDENT_AMBULATORY_CARE_PROVIDER_SITE_OTHER): Payer: Self-pay

## 2020-11-15 ENCOUNTER — Encounter: Payer: Self-pay | Admitting: Oncology

## 2020-11-15 VITALS — BP 123/76 | HR 86 | Temp 98.2°F | Resp 20 | Wt 201.1 lb

## 2020-11-15 DIAGNOSIS — N920 Excessive and frequent menstruation with regular cycle: Secondary | ICD-10-CM | POA: Diagnosis not present

## 2020-11-15 DIAGNOSIS — R791 Abnormal coagulation profile: Secondary | ICD-10-CM | POA: Diagnosis not present

## 2020-11-15 DIAGNOSIS — Z8759 Personal history of other complications of pregnancy, childbirth and the puerperium: Secondary | ICD-10-CM

## 2020-11-15 DIAGNOSIS — E669 Obesity, unspecified: Secondary | ICD-10-CM | POA: Diagnosis not present

## 2020-11-15 DIAGNOSIS — Z79899 Other long term (current) drug therapy: Secondary | ICD-10-CM | POA: Insufficient documentation

## 2020-11-15 LAB — CBC WITH DIFFERENTIAL/PLATELET
Abs Immature Granulocytes: 0.02 10*3/uL (ref 0.00–0.07)
Basophils Absolute: 0 10*3/uL (ref 0.0–0.1)
Basophils Relative: 0 %
Eosinophils Absolute: 0.2 10*3/uL (ref 0.0–0.5)
Eosinophils Relative: 2 %
HCT: 37.2 % (ref 36.0–46.0)
Hemoglobin: 12.4 g/dL (ref 12.0–15.0)
Immature Granulocytes: 0 %
Lymphocytes Relative: 28 %
Lymphs Abs: 3.1 10*3/uL (ref 0.7–4.0)
MCH: 28.8 pg (ref 26.0–34.0)
MCHC: 33.3 g/dL (ref 30.0–36.0)
MCV: 86.3 fL (ref 80.0–100.0)
Monocytes Absolute: 0.7 10*3/uL (ref 0.1–1.0)
Monocytes Relative: 7 %
Neutro Abs: 7.1 10*3/uL (ref 1.7–7.7)
Neutrophils Relative %: 63 %
Platelets: 379 10*3/uL (ref 150–400)
RBC: 4.31 MIL/uL (ref 3.87–5.11)
RDW: 11.8 % (ref 11.5–15.5)
WBC: 11.3 10*3/uL — ABNORMAL HIGH (ref 4.0–10.5)
nRBC: 0 % (ref 0.0–0.2)

## 2020-11-15 LAB — IRON AND TIBC
Iron: 54 ug/dL (ref 28–170)
Saturation Ratios: 13 % (ref 10.4–31.8)
TIBC: 412 ug/dL (ref 250–450)
UIBC: 358 ug/dL

## 2020-11-15 LAB — TECHNOLOGIST SMEAR REVIEW
Plt Morphology: NORMAL
RBC Morphology: NORMAL
WBC Morphology: NORMAL

## 2020-11-15 LAB — C-REACTIVE PROTEIN: CRP: 1.2 mg/dL — ABNORMAL HIGH (ref <1.0)

## 2020-11-15 LAB — FERRITIN: Ferritin: 20 ng/mL (ref 11–307)

## 2020-11-15 LAB — SEDIMENTATION RATE: Sed Rate: 41 mm/hr — ABNORMAL HIGH (ref 0–20)

## 2020-11-15 NOTE — Progress Notes (Signed)
Hematology/Oncology Consult note [facility] [phone] [phone]   Patient Care Team: [facility] as PCP - General  REFERRING PROVIDER: Aletha Halim, MD  CHIEF COMPLAINTS/REASON FOR VISIT:  Evaluation of abnormal coagulation profile  HISTORY OF PRESENTING ILLNESS:   Sonya Frazier is a  25 y.o.  female with PMH listed below was seen in consultation at the request of  Aletha Halim, MD  for evaluation of abnormal coagulation profile.  Patient previously was on Depo Provera and stopped Provera in October 2021 due to weight gain. Reports very heavy menstrual cycles since then with lots of blood clots. Patient has a history of miscarriage, as well as ectopic pregnancy.  Patient has a 53-year-old son Her GYN ordered von Willebrand panel for evaluation of menorrhagia with regular cycles 10/10/2020,  factor VIII activity 277 [56--140%\], von Willebrand antigen 248 [50-200%], von Willebrand factor 58%  Patient was referred to establish care with hematology for evaluation. She denies any unintentional weight loss, night sweats, fever.  Denies any history of thrombosis. Her sister has a history of lupus.  Review of Systems  Constitutional: Negative for appetite change, chills, fatigue and fever.  HENT:   Negative for hearing loss and voice change.   Eyes: Negative for eye problems.  Respiratory: Negative for chest tightness and cough.   Cardiovascular: Negative for chest pain.  Gastrointestinal: Negative for abdominal distention, abdominal pain and blood in stool.  Endocrine: Negative for hot flashes.  Genitourinary: Positive for menstrual problem. Negative for difficulty urinating and frequency.   Musculoskeletal: Negative for arthralgias.  Skin: Negative for itching and rash.  Neurological: Negative for extremity weakness.  Hematological: Negative for adenopathy.  Psychiatric/Behavioral: Negative for  confusion.    MEDICAL HISTORY:  Past Medical History:  Diagnosis Date  . Anemia   . COVID-19 virus infection 01/19/2019   Asymptomatic  01/13/19  . Ectopic pregnancy, tubal 01/13/2019   Live 8.0 wk RT ectopic preg measuring 4.5 x 2.9 x 3.1 cm  HR of 177 bpm  . Preeclampsia     SURGICAL HISTORY: Past Surgical History:  Procedure Laterality Date  . CESAREAN SECTION N/A 09/10/2017   Procedure: CESAREAN SECTION;  Surgeon: Florian Buff, MD;  Location: [place];  Service: Obstetrics;  Laterality: N/A;  . LAPAROSCOPIC UNILATERAL SALPINGECTOMY Right 01/13/2019   Procedure: LAPAROSCOPIC UNILATERAL SALPINGECTOMY;  Surgeon: Florian Buff, MD;  Location: [place];  Service: Gynecology;  Laterality: Right;  . WISDOM TOOTH EXTRACTION      SOCIAL HISTORY: Social History   Socioeconomic History  . Marital status: Single    Spouse name: Not on file  . Number of children: Not on file  . Years of education: Not on file  . Highest education level: Not on file  Occupational History  . Not on file  Tobacco Use  . Smoking status: Never Smoker  . Smokeless tobacco: Never Used  Substance and Sexual Activity  . Alcohol use: No    Comment: occ  . Drug use: No    Types: Marijuana    Comment: Quit approx 1 year ago  . Sexual activity: Yes    Birth control/protection: Injection  Other Topics Concern  . Not on file  Social History Narrative  . Not on file   Social Determinants of Health   Financial Resource Strain: Not on file  Food Insecurity: No Food Insecurity  . Worried About Charity fundraiser in the Last Year: Never true  .  Ran Out of Food in the Last Year: Never true  Transportation Needs: No Transportation Needs  . Lack of Transportation (Medical): No  . Lack of Transportation (Non-Medical): No  Physical Activity: Not on file  Stress: Not on file  Social Connections: Not on file  Intimate Partner Violence: Not on file    FAMILY HISTORY: Family History  Problem  Relation Age of Onset  . Asthma Mother   . Cancer Maternal Aunt     ALLERGIES:  has No Known Allergies.  MEDICATIONS:  Current Outpatient Medications  Medication Sig Dispense Refill  . acetaminophen (TYLENOL) 325 MG tablet Take 650 mg by mouth every 6 (six) hours as needed for moderate pain.    . medroxyPROGESTERone (PROVERA) 10 MG tablet 1 tab po bid x 3 days when your period starts. You can increase to tid if pain and bleeding are really bad (Patient not taking: Reported on 11/15/2020) 40 tablet 1  . naproxen (NAPROSYN) 500 MG tablet 1 tab po bid x 3 days the day before your period starts or the day it starts (Patient not taking: Reported on 11/15/2020) 60 tablet 2   No current facility-administered medications for this visit.     PHYSICAL EXAMINATION: ECOG PERFORMANCE STATUS: 0 - Asymptomatic Vitals:   11/15/20 1518  BP: 123/76  Pulse: 86  Resp: 20  Temp: 98.2 F (36.8 C)  SpO2: 98%   Filed Weights   11/15/20 1518  Weight: 201 lb 1.6 oz (91.2 kg)    Physical Exam Constitutional:      General: She is not in acute distress.    Appearance: She is obese.  HENT:     Head: Normocephalic and atraumatic.  Eyes:     General: No scleral icterus. Cardiovascular:     Rate and Rhythm: Normal rate and regular rhythm.     Heart sounds: Normal heart sounds.  Pulmonary:     Effort: Pulmonary effort is normal. No respiratory distress.     Breath sounds: No wheezing.  Abdominal:     General: Bowel sounds are normal. There is no distension.     Palpations: Abdomen is soft.  Musculoskeletal:        General: No deformity. Normal range of motion.     Cervical back: Normal range of motion and neck supple.  Skin:    General: Skin is warm and dry.     Findings: No erythema or rash.  Neurological:     Mental Status: She is alert and oriented to person, place, and time. Mental status is at baseline.     Cranial Nerves: No cranial nerve deficit.     Coordination: Coordination normal.   Psychiatric:        Mood and Affect: Mood normal.     LABORATORY DATA:  I have reviewed the data as listed Lab Results  Component Value Date   WBC 11.3 (H) 11/15/2020   HGB 12.4 11/15/2020   HCT 37.2 11/15/2020   MCV 86.3 11/15/2020   PLT 379 11/15/2020   Recent Labs    02/29/20 2242 03/07/20 1048  NA 139 139  K 3.6 4.0  CL 107 104  CO2 21* 20  GLUCOSE 101* 77  BUN 17 9  CREATININE 1.09* 0.90  CALCIUM 9.6 9.8  GFRNONAA >60 90  GFRAA >60 103   Iron/TIBC/Ferritin/ %Sat    Component Value Date/Time   IRON 54 11/15/2020 1600   IRON 40 06/02/2017 1050   TIBC 412 11/15/2020 1600   TIBC 398  06/02/2017 1050   FERRITIN 20 11/15/2020 1600   FERRITIN 10 (L) 06/02/2017 1050   IRONPCTSAT 13 11/15/2020 1600   IRONPCTSAT 10 (L) 06/02/2017 1050      RADIOGRAPHIC STUDIES: I have personally reviewed the radiological images as listed and agreed with the findings in the report. No results found.    ASSESSMENT & PLAN:  1. Elevated factor VIII level   2. Menorrhagia with regular cycle   3. History of miscarriage    #Labs reviewed and discussed with patient. Elevated factor VIII activity, elevated von Willebrand antigen level. Discussed with patient and her partner that elevation of these numbers are not diagnostic and nonspecific. He can be secondary to acute on chronic inflammation, infection, autoimmune disease, emotional stress etc. There are some studies associating elevated factor VIII/von Willebrand level with increased risk of thrombosis.  However no current guidelines available for intervention.  History of miscarriage.  Family history of lupus.  I recommend patient to check CBC, smear, antiphospholipid syndrome, ESR, CRP.  Menorrhagia with regular cycle.  Check iron and TIBC.   Orders Placed This Encounter  Procedures  . CBC with Differential/Platelet    Standing Status:   Future    Number of Occurrences:   1    Standing Expiration Date:   11/15/2021  .  Technologist smear review    Standing Status:   Future    Number of Occurrences:   1    Standing Expiration Date:   11/15/2021  . ANA, IFA (with reflex)    Standing Status:   Future    Number of Occurrences:   1    Standing Expiration Date:   11/15/2021  . C-reactive protein    Standing Status:   Future    Number of Occurrences:   1    Standing Expiration Date:   11/15/2021  . Sedimentation rate    Standing Status:   Future    Number of Occurrences:   1    Standing Expiration Date:   11/15/2021  . Iron and TIBC    Standing Status:   Future    Number of Occurrences:   1    Standing Expiration Date:   11/15/2021  . Ferritin    Standing Status:   Future    Number of Occurrences:   1    Standing Expiration Date:   11/15/2021  . ANTIPHOSPHOLIPID SYNDROME PROF    Standing Status:   Future    Number of Occurrences:   1    Standing Expiration Date:   11/15/2021    All questions were answered. The patient knows to call the clinic with any problems questions or concerns.  cc Aletha Halim, MD    Return of visit: 2 weeks to discuss results. Thank you for this kind referral and the opportunity to participate in the care of this patient. A copy of today's note is routed to referring provider    Earlie Server, MD, PhD Hematology Oncology [facility] at [facility] Pager- [phone] 11/15/2020

## 2020-11-19 LAB — ANTIPHOSPHOLIPID SYNDROME PROF
Anticardiolipin IgG: <9 GPL U/mL (ref 0–14)
Anticardiolipin IgM: <9 MPL U/mL (ref 0–12)
DRVVT: 29.3 s (ref 0.0–47.0)
PTT Lupus Anticoagulant: 26 s (ref 0.0–51.9)

## 2020-11-19 LAB — ANTINUCLEAR ANTIBODIES, IFA: ANA Ab, IFA: NEGATIVE

## 2020-11-29 ENCOUNTER — Inpatient Hospital Stay (HOSPITAL_BASED_OUTPATIENT_CLINIC_OR_DEPARTMENT_OTHER): Payer: Medicaid Other | Admitting: Oncology

## 2020-11-29 ENCOUNTER — Encounter: Payer: Self-pay | Admitting: Oncology

## 2020-11-29 DIAGNOSIS — E669 Obesity, unspecified: Secondary | ICD-10-CM | POA: Diagnosis not present

## 2020-11-29 DIAGNOSIS — R791 Abnormal coagulation profile: Secondary | ICD-10-CM | POA: Diagnosis not present

## 2020-11-29 DIAGNOSIS — D72829 Elevated white blood cell count, unspecified: Secondary | ICD-10-CM | POA: Diagnosis not present

## 2020-11-29 DIAGNOSIS — R7982 Elevated C-reactive protein (CRP): Secondary | ICD-10-CM | POA: Insufficient documentation

## 2020-11-29 DIAGNOSIS — Z79899 Other long term (current) drug therapy: Secondary | ICD-10-CM | POA: Diagnosis not present

## 2020-11-29 DIAGNOSIS — R700 Elevated erythrocyte sedimentation rate: Secondary | ICD-10-CM | POA: Diagnosis not present

## 2020-11-29 DIAGNOSIS — N920 Excessive and frequent menstruation with regular cycle: Secondary | ICD-10-CM | POA: Diagnosis not present

## 2020-11-29 NOTE — Progress Notes (Signed)
Patient states she is having sharp pains for the last week in her abdomen. Patient states she had nose bleed 4/29 for about 3-5 mins with heavy bleeding which woke her up out her sleep. Patient states one day last week she had chest pain to point it made her cry. Patient also states for the past 2 weeks if she stares at something for a short period of time her vision is blurry. Patient states she has been out breathe easier for the past week.

## 2020-11-29 NOTE — Progress Notes (Addendum)
HEMATOLOGY-ONCOLOGY TeleHEALTH VISIT PROGRESS NOTE  I connected with Sonya Frazier on 11/29/20  at  2:30 PM EDT by video enabled telemedicine visit and verified that I am speaking with the correct person using two identifiers. I discussed the limitations, risks, security and privacy concerns of performing an evaluation and management service by telemedicine and the availability of in-person appointments. The patient expressed understanding and agreed to proceed.   Other persons participating in the visit and their role in the encounter:  None  Patient's location: Home  Provider's location: office Chief Complaint: elevated factor VIII   INTERVAL HISTORY Sonya Frazier is a 25 y.o. female who has above history reviewed by me today presents for follow up visit for discussion blood work. Problems and complaints are listed below:  She reports one episode of chest pain which woke her up last week. + SOB with exertion sometimes. .  not currently.   Review of Systems  Constitutional: Negative for appetite change, chills, fatigue and fever.  HENT:   Negative for hearing loss and voice change.   Eyes: Negative for eye problems.  Respiratory: Positive for shortness of breath. Negative for chest tightness and cough.   Cardiovascular: Positive for chest pain.  Gastrointestinal: Negative for abdominal distention, abdominal pain and blood in stool.  Endocrine: Negative for hot flashes.  Genitourinary: Negative for difficulty urinating and frequency.   Musculoskeletal: Positive for arthralgias.  Skin: Negative for itching and rash.  Neurological: Negative for extremity weakness.  Hematological: Negative for adenopathy.  Psychiatric/Behavioral: Negative for confusion.    Past Medical History:  Diagnosis Date  . Anemia   . COVID-19 virus infection 01/19/2019   Asymptomatic  01/13/19  . Ectopic pregnancy, tubal 01/13/2019   Live 8.0 wk RT ectopic preg measuring 4.5 x 2.9 x 3.1  cm  HR of 177 bpm  . Preeclampsia    Past Surgical History:  Procedure Laterality Date  . CESAREAN SECTION N/A 09/10/2017   Procedure: CESAREAN SECTION;  Surgeon: Florian Buff, MD;  Location: [place];  Service: Obstetrics;  Laterality: N/A;  . LAPAROSCOPIC UNILATERAL SALPINGECTOMY Right 01/13/2019   Procedure: LAPAROSCOPIC UNILATERAL SALPINGECTOMY;  Surgeon: Florian Buff, MD;  Location: [place];  Service: Gynecology;  Laterality: Right;  . WISDOM TOOTH EXTRACTION      Family History  Problem Relation Age of Onset  . Asthma Mother   . Cancer Maternal Aunt     Social History   Socioeconomic History  . Marital status: Single    Spouse name: Not on file  . Number of children: Not on file  . Years of education: Not on file  . Highest education level: Not on file  Occupational History  . Not on file  Tobacco Use  . Smoking status: Never Smoker  . Smokeless tobacco: Never Used  Substance and Sexual Activity  . Alcohol use: No    Comment: occ  . Drug use: No    Types: Marijuana    Comment: Quit approx 1 year ago  . Sexual activity: Yes    Birth control/protection: Injection  Other Topics Concern  . Not on file  Social History Narrative  . Not on file   Social Determinants of Health   Financial Resource Strain: Not on file  Food Insecurity: No Food Insecurity  . Worried About Charity fundraiser in the Last Year: Never true  . Ran Out of Food in the Last Year: Never true  Transportation Needs: No Transportation Needs  .  Lack of Transportation (Medical): No  . Lack of Transportation (Non-Medical): No  Physical Activity: Not on file  Stress: Not on file  Social Connections: Not on file  Intimate Partner Violence: Not on file    Current Outpatient Medications on File Prior to Visit  Medication Sig Dispense Refill  . acetaminophen (TYLENOL) 325 MG tablet Take 650 mg by mouth every 6 (six) hours as needed for moderate pain.    . medroxyPROGESTERone (PROVERA) 10  MG tablet 1 tab po bid x 3 days when your period starts. You can increase to tid if pain and bleeding are really bad (Patient not taking: No sig reported) 40 tablet 1  . naproxen (NAPROSYN) 500 MG tablet 1 tab po bid x 3 days the day before your period starts or the day it starts (Patient not taking: No sig reported) 60 tablet 2   No current facility-administered medications on file prior to visit.    No Known Allergies     Observations/Objective: Today's Vitals   11/29/20 1326  PainSc: 5    There is no height or weight on file to calculate BMI.  Physical Exam  CBC    Component Value Date/Time   WBC 11.3 (H) 11/15/2020 1600   RBC 4.31 11/15/2020 1600   HGB 12.4 11/15/2020 1600   HGB 12.8 10/10/2020 1032   HCT 37.2 11/15/2020 1600   HCT 37.7 10/10/2020 1032   PLT 379 11/15/2020 1600   PLT 311 10/10/2020 1032   MCV 86.3 11/15/2020 1600   MCV 87 10/10/2020 1032   MCH 28.8 11/15/2020 1600   MCHC 33.3 11/15/2020 1600   RDW 11.8 11/15/2020 1600   RDW 12.1 10/10/2020 1032   LYMPHSABS 3.1 11/15/2020 1600   LYMPHSABS 2.2 02/09/2017 1027   MONOABS 0.7 11/15/2020 1600   EOSABS 0.2 11/15/2020 1600   EOSABS 0.0 02/09/2017 1027   BASOSABS 0.0 11/15/2020 1600   BASOSABS 0.0 02/09/2017 1027    CMP     Component Value Date/Time   NA 139 03/07/2020 1048   K 4.0 03/07/2020 1048   CL 104 03/07/2020 1048   CO2 20 03/07/2020 1048   GLUCOSE 77 03/07/2020 1048   GLUCOSE 101 (H) 02/29/2020 2242   BUN 9 03/07/2020 1048   CREATININE 0.90 03/07/2020 1048   CREATININE 0.70 08/11/2017 1322   CALCIUM 9.8 03/07/2020 1048   PROT 9.4 (H) 05/14/2019 1256   ALBUMIN 4.8 05/14/2019 1256   AST 20 05/14/2019 1256   AST 12 08/11/2017 1322   ALT 24 05/14/2019 1256   ALT 6 08/11/2017 1322   ALKPHOS 82 05/14/2019 1256   BILITOT 0.4 05/14/2019 1256   BILITOT 0.4 08/11/2017 1322   GFRNONAA 90 03/07/2020 1048   GFRNONAA >60 08/11/2017 1322   GFRAA 103 03/07/2020 1048   GFRAA >60 08/11/2017 1322      Assessment and Plan: 1. Elevated factor VIII level   2. ESR raised   3. CRP elevated   4. Leukocytosis, unspecified type    # Elevated factor VIII activity, elevated von Willebrand antigen level are not specific. If persistent high factor VIII level, may have increased risk of Thrombosis. No current guideline to follow and no evidence of intervention may decrease risk.  Other etiologies also include acute or chronic inflammation.  She has elevated ESR and CRP. With further questioning, she endorses knee and hand joint swelling and pain, morning stiffness. I recommend further rheumatology work up. Refer to Rheumatology.   # Chronic leukocytosis will check peripheral  flowcytometry, hepatitis, SPEP   episode of chest pain and recent shortness of breath. Currently no symptoms.  I encourage patient to further discuss with her primary care provider /gyn. If symptoms recur seek medical advise immediately.    Follow Up Instructions:  If above work up is negative, she does not need further follow up  I discussed the assessment and treatment plan with the patient. The patient was provided an opportunity to ask questions and all were answered. The patient agreed with the plan and demonstrated an understanding of the instructions.  The patient was advised to call back or seek an in-person evaluation if the symptoms worsen or if the condition fails to improve as anticipated.   Earlie Server, MD 11/29/2020 10:29 PM

## 2020-11-29 NOTE — Addendum Note (Signed)
Addended by: Rickard Patience on: 11/29/2020 11:12 PM   Modules accepted: Orders

## 2020-12-01 DIAGNOSIS — Z419 Encounter for procedure for purposes other than remedying health state, unspecified: Secondary | ICD-10-CM | POA: Diagnosis not present

## 2020-12-06 ENCOUNTER — Inpatient Hospital Stay: Payer: Medicaid Other | Attending: Oncology

## 2020-12-06 ENCOUNTER — Telehealth: Payer: Self-pay

## 2020-12-06 DIAGNOSIS — D72829 Elevated white blood cell count, unspecified: Secondary | ICD-10-CM | POA: Insufficient documentation

## 2020-12-06 DIAGNOSIS — R791 Abnormal coagulation profile: Secondary | ICD-10-CM

## 2020-12-06 DIAGNOSIS — D66 Hereditary factor VIII deficiency: Secondary | ICD-10-CM | POA: Diagnosis not present

## 2020-12-06 LAB — LACTATE DEHYDROGENASE: LDH: 112 U/L (ref 98–192)

## 2020-12-06 NOTE — Telephone Encounter (Signed)
Received message from the [facility] that patient wanted Dr. Cathie Hoops to know that earlier in the week she was having blurred vision, swollen feet, and tingling sensation from her elbows down.  Also having a heavy menstrual cycle.  It was advised for patient to go to ER but she declined and said she had to go to church she just wanted Dr. Cathie Hoops to know what happened.

## 2020-12-09 ENCOUNTER — Telehealth: Payer: Self-pay

## 2020-12-09 LAB — PROTEIN ELECTROPHORESIS, SERUM
A/G Ratio: 0.9 (ref 0.7–1.7)
Albumin ELP: 3.4 g/dL (ref 2.9–4.4)
Alpha-1-Globulin: 0.2 g/dL (ref 0.0–0.4)
Alpha-2-Globulin: 0.7 g/dL (ref 0.4–1.0)
Beta Globulin: 1.5 g/dL — ABNORMAL HIGH (ref 0.7–1.3)
Gamma Globulin: 1.6 g/dL (ref 0.4–1.8)
Globulin, Total: 4 g/dL — ABNORMAL HIGH (ref 2.2–3.9)
Total Protein ELP: 7.4 g/dL (ref 6.0–8.5)

## 2020-12-09 NOTE — Telephone Encounter (Signed)
Confirmed referral was received.  The called the patient on 12/03/20 and left a message requesting her to call their office to schedule an appointment.

## 2020-12-10 LAB — COMP PANEL: LEUKEMIA/LYMPHOMA

## 2020-12-16 ENCOUNTER — Telehealth: Payer: Self-pay

## 2020-12-16 NOTE — Telephone Encounter (Signed)
-----   Message from Rickard Patience, MD sent at 12/14/2020  3:30 PM EDT ----- Let her know that the additional blood work are non remarkable. Thanks.  No need to follow up . No change to the recommendation per our discussion

## 2020-12-16 NOTE — Telephone Encounter (Signed)
Message left on voicemail with MD recommendations and to call the office with any questions.

## 2021-01-01 DIAGNOSIS — Z419 Encounter for procedure for purposes other than remedying health state, unspecified: Secondary | ICD-10-CM | POA: Diagnosis not present

## 2021-01-02 ENCOUNTER — Other Ambulatory Visit: Payer: Self-pay

## 2021-01-02 ENCOUNTER — Ambulatory Visit (INDEPENDENT_AMBULATORY_CARE_PROVIDER_SITE_OTHER): Payer: Medicaid Other | Admitting: Obstetrics and Gynecology

## 2021-01-02 ENCOUNTER — Encounter: Payer: Self-pay | Admitting: Obstetrics and Gynecology

## 2021-01-02 VITALS — BP 127/71 | HR 85 | Ht 60.0 in | Wt 199.1 lb

## 2021-01-02 DIAGNOSIS — N920 Excessive and frequent menstruation with regular cycle: Secondary | ICD-10-CM | POA: Diagnosis not present

## 2021-01-02 DIAGNOSIS — N946 Dysmenorrhea, unspecified: Secondary | ICD-10-CM | POA: Diagnosis not present

## 2021-01-02 MED ORDER — FOLIC ACID 1 MG PO TABS: 1.0000 mg | ORAL_TABLET | Freq: Every day | ORAL | 2 refills | Status: DC

## 2021-01-02 MED ORDER — NORETHINDRONE ACETATE 5 MG PO TABS: ORAL_TABLET | ORAL | 2 refills | Status: DC

## 2021-01-02 NOTE — Progress Notes (Signed)
Obstetrics and Gynecology Visit Return Patient Evaluation  Appointment Date: 01/02/2021  OBGYN Clinic: [facility]   Chief Complaint: f/u heavy, painful periods; elevated factor 8  History of Present Illness:  Sonya Frazier is a 25 y.o. [id number], LMP 12/05/20, with above CC.   Patient last saw me on 3/10 for dysmenorrhea and menorrhagia.    Patient last had depo provera on 9/1 and stopped b/c of weight gain. She states she was usually amenorrheic on it but had aub all throughout October and November and then went back to [place], regular, 5 day periods but they are painful and heavy for the first 3 days and sometimes gets dizzy and lightheaded and has occasional nosebleeds.  LMP just finishing  She is okay with getting pregnant.   Pap negative 07/2019   She had negative cbc and coags. vWP panel checked and she had an elevated factor 8 and was referred to heme. She was put on naproxen and po provera in the interim due to being self pay at that time.   Interval History: Since that time, she states she continues to have heavy and painful periods. Pharmacy didn't have the nsaid or provera so she has not done that yet.   Hematology unsure of significance of elevated factor 8 and referred her to rheum for elevated ESR and joint pain.   Her and her husband are actively trying to get pregnant.  Review of Systems: Pertinent items noted in HPI and remainder of comprehensive ROS otherwise negative.    Patient Active Problem List   Diagnosis Date Noted  . Menorrhagia with regular cycle 01/02/2021  . Dysmenorrhea 01/02/2021  . ESR raised 11/29/2020  . CRP elevated 11/29/2020  . Elevated factor VIII level 11/29/2020  . Breast pain, left 03/07/2020  . Galactorrhea of left breast 03/07/2020  . Family history of breast cancer 03/07/2020   Medications: None  Allergies: has No Known Allergies.  Physical Exam:  BP 127/71   Pulse 85   Ht 5'  (1.524 m)   Wt 199 lb 1.6 oz (90.3 kg)   LMP 12/05/2020 (Exact Date)   BMI 38.88 kg/m  Body mass index is 38.88 kg/m. General appearance: Well nourished, well developed female in no acute distress.  Neuro/Psych:  Normal mood and affect.    Assessment: pt stable  Plan:  1. Dysmenorrhea Since trying to get pregnant, I told her I recommend doing something during the time of her periods. She has medicaid now so will try aygestin 40m po bid x 5 days to start at the time of her period.  2. Fertility  Folic acid sent in  Patient is using an app to help conceive and they've been trying for at least six months. I recommend he stop smoking and any alcohol use, as well as her if she does any and I recommend a semen analysis.   I told them if SA is negative to call my office and we can do a fertility work up on her   RTC: PRN  CAletha Halim JBrooke BonitoMD Attending Center for [employer](Fish farm manager

## 2021-01-02 NOTE — Progress Notes (Signed)
Pt reports [facility] that [facility] and [facility]en [facility] sent to did not have in stock, needs sent to new pharmacy. Pharmacy changed.

## 2021-01-14 DIAGNOSIS — E669 Obesity, unspecified: Secondary | ICD-10-CM | POA: Diagnosis not present

## 2021-01-14 DIAGNOSIS — Z0000 Encounter for general adult medical examination without abnormal findings: Secondary | ICD-10-CM | POA: Diagnosis not present

## 2021-01-14 DIAGNOSIS — Z131 Encounter for screening for diabetes mellitus: Secondary | ICD-10-CM | POA: Diagnosis not present

## 2021-01-14 DIAGNOSIS — Z8759 Personal history of other complications of pregnancy, childbirth and the puerperium: Secondary | ICD-10-CM | POA: Diagnosis not present

## 2021-01-14 DIAGNOSIS — N920 Excessive and frequent menstruation with regular cycle: Secondary | ICD-10-CM | POA: Diagnosis not present

## 2021-01-14 DIAGNOSIS — R030 Elevated blood-pressure reading, without diagnosis of hypertension: Secondary | ICD-10-CM | POA: Diagnosis not present

## 2021-01-31 DIAGNOSIS — Z419 Encounter for procedure for purposes other than remedying health state, unspecified: Secondary | ICD-10-CM | POA: Diagnosis not present

## 2021-02-17 DIAGNOSIS — N920 Excessive and frequent menstruation with regular cycle: Secondary | ICD-10-CM | POA: Diagnosis not present

## 2021-02-17 DIAGNOSIS — Z3A01 Less than 8 weeks gestation of pregnancy: Secondary | ICD-10-CM | POA: Diagnosis not present

## 2021-02-17 DIAGNOSIS — E669 Obesity, unspecified: Secondary | ICD-10-CM | POA: Diagnosis not present

## 2021-02-17 DIAGNOSIS — R030 Elevated blood-pressure reading, without diagnosis of hypertension: Secondary | ICD-10-CM | POA: Diagnosis not present

## 2021-02-17 DIAGNOSIS — Z8759 Personal history of other complications of pregnancy, childbirth and the puerperium: Secondary | ICD-10-CM | POA: Diagnosis not present

## 2021-02-19 ENCOUNTER — Other Ambulatory Visit: Payer: Self-pay | Admitting: Physician Assistant

## 2021-02-19 DIAGNOSIS — R102 Pelvic and perineal pain: Secondary | ICD-10-CM

## 2021-02-20 ENCOUNTER — Other Ambulatory Visit: Payer: Self-pay

## 2021-02-20 ENCOUNTER — Inpatient Hospital Stay (HOSPITAL_COMMUNITY)
Admission: AD | Admit: 2021-02-20 | Discharge: 2021-02-20 | Disposition: A | Discharge status: Home / Self Care | Payer: Medicaid Other | Attending: Obstetrics & Gynecology | Admitting: Obstetrics & Gynecology

## 2021-02-20 DIAGNOSIS — R109 Unspecified abdominal pain: Secondary | ICD-10-CM | POA: Insufficient documentation

## 2021-02-20 DIAGNOSIS — Z3202 Encounter for pregnancy test, result negative: Secondary | ICD-10-CM

## 2021-02-20 DIAGNOSIS — R110 Nausea: Secondary | ICD-10-CM | POA: Insufficient documentation

## 2021-02-20 LAB — HCG, QUANTITATIVE, PREGNANCY: hCG, Beta Chain, Quant, S: <1 m[IU]/mL (ref <5)

## 2021-02-20 LAB — POCT PREGNANCY, URINE: Preg Test, Ur: NEGATIVE

## 2021-02-20 NOTE — MAU Provider Note (Signed)
Event Date/Time   First Provider Initiated Contact with Patient 02/20/21 1348      S [name] is a 25 y.o. [phone] patient who presents to MAU today with complaint of abdominal pain and nausea in the setting of positive home pregnancy test.  Patient endorses LMP of 02/01/2021. Symptom onset shortly after her period. She states she has never experienced these sorts of symptoms during any time in her menstrual cycle. Pain score 3/10 and "crampy". She has not taken medication or tried other treatments for this complaint.  History c/b right salpingectomy 01/2019.  O BP 119/81   Pulse (!) 108   Temp 98.1 F (36.7 C)   Resp 18   Ht 5' (1.524 m)   Wt 90.3 kg   LMP 02/01/2021   BMI 38.86 kg/m    Physical Exam Vitals and nursing note reviewed. Exam conducted with a chaperone present.  Constitutional:      General: She is not in acute distress.    Appearance: She is well-developed. She is obese. She is not ill-appearing.  Cardiovascular:     Rate and Rhythm: Normal rate.     Heart sounds: Normal heart sounds.  Pulmonary:     Effort: Pulmonary effort is normal.  Abdominal:     Palpations: Abdomen is soft.  Skin:    Capillary Refill: Capillary refill takes less than 2 seconds.  Neurological:     Mental Status: She is alert and oriented to person, place, and time.  Psychiatric:        Mood and Affect: Mood normal.        Behavior: Behavior normal.    A Medical screening exam complete EMR reflects office visits for dysmenorrhea, amenorrhea as recently as 01/02/2021 Reassurance provided to patient  Results for orders placed or performed during the hospital encounter of 02/20/21 (from the past 24 hour(s))  Pregnancy, urine POC     Status: None   Collection Time: 02/20/21 11:58 AM  Result Value Ref Range   Preg Test, Ur NEGATIVE NEGATIVE  hCG, quantitative, pregnancy     Status: None   Collection Time: 02/20/21 12:19 PM  Result Value Ref Range   hCG, Beta  Chain, Quant, S <1 <5 mIU/mL    P Discharge from MAU in stable condition Patient given the option of transfer to [facility] for further evaluation or seek care in outpatient facility Warning signs for worsening condition that would warrant emergency follow-up discussed Patient may return to MAU as needed   Clayton Bibles, CNM 02/20/2021 3:15 PM

## 2021-02-20 NOTE — MAU Note (Signed)
Pt reports positive HPT on Friday. C/O lower abd pain on and off and nausea.Denies any vag bleeding or discharge.

## 2021-02-24 ENCOUNTER — Encounter (HOSPITAL_COMMUNITY): Payer: Self-pay

## 2021-02-24 ENCOUNTER — Other Ambulatory Visit: Payer: Self-pay

## 2021-02-24 ENCOUNTER — Emergency Department (HOSPITAL_COMMUNITY)
Admission: EM | Admit: 2021-02-24 | Discharge: 2021-02-25 | Disposition: A | Discharge status: Home / Self Care | Payer: Medicaid Other | Attending: Physician Assistant | Admitting: Physician Assistant

## 2021-02-24 DIAGNOSIS — R109 Unspecified abdominal pain: Secondary | ICD-10-CM | POA: Diagnosis not present

## 2021-02-24 DIAGNOSIS — Z5321 Procedure and treatment not carried out due to patient leaving prior to being seen by health care provider: Secondary | ICD-10-CM | POA: Diagnosis not present

## 2021-02-24 DIAGNOSIS — R1031 Right lower quadrant pain: Secondary | ICD-10-CM | POA: Insufficient documentation

## 2021-02-24 LAB — COMPREHENSIVE METABOLIC PANEL
ALT: 19 U/L (ref 0–44)
AST: 20 U/L (ref 15–41)
Albumin: 3.9 g/dL (ref 3.5–5.0)
Alkaline Phosphatase: 74 U/L (ref 38–126)
Anion gap: 10 (ref 5–15)
BUN: 17 mg/dL (ref 6–20)
CO2: 24 mmol/L (ref 22–32)
Calcium: 9.9 mg/dL (ref 8.9–10.3)
Chloride: 104 mmol/L (ref 98–111)
Creatinine, Ser: 0.73 mg/dL (ref 0.44–1.00)
GFR, Estimated: >60 mL/min (ref >60)
Glucose, Bld: 107 mg/dL — ABNORMAL HIGH (ref 70–99)
Potassium: 4 mmol/L (ref 3.5–5.1)
Sodium: 138 mmol/L (ref 135–145)
Total Bilirubin: 0.4 mg/dL (ref 0.3–1.2)
Total Protein: 8.5 g/dL — ABNORMAL HIGH (ref 6.5–8.1)

## 2021-02-24 LAB — CBC WITH DIFFERENTIAL/PLATELET
Abs Immature Granulocytes: 0.02 10*3/uL (ref 0.00–0.07)
Basophils Absolute: 0 10*3/uL (ref 0.0–0.1)
Basophils Relative: 0 %
Eosinophils Absolute: 0.2 10*3/uL (ref 0.0–0.5)
Eosinophils Relative: 1 %
HCT: 39.8 % (ref 36.0–46.0)
Hemoglobin: 12.6 g/dL (ref 12.0–15.0)
Immature Granulocytes: 0 %
Lymphocytes Relative: 30 %
Lymphs Abs: 4 10*3/uL (ref 0.7–4.0)
MCH: 27.9 pg (ref 26.0–34.0)
MCHC: 31.7 g/dL (ref 30.0–36.0)
MCV: 88.1 fL (ref 80.0–100.0)
Monocytes Absolute: 0.9 10*3/uL (ref 0.1–1.0)
Monocytes Relative: 7 %
Neutro Abs: 8.2 10*3/uL — ABNORMAL HIGH (ref 1.7–7.7)
Neutrophils Relative %: 62 %
Platelets: 373 10*3/uL (ref 150–400)
RBC: 4.52 MIL/uL (ref 3.87–5.11)
RDW: 13.1 % (ref 11.5–15.5)
WBC: 13.3 10*3/uL — ABNORMAL HIGH (ref 4.0–10.5)
nRBC: 0 % (ref 0.0–0.2)

## 2021-02-24 LAB — HCG, SERUM, QUALITATIVE: Preg, Serum: NEGATIVE

## 2021-02-24 NOTE — ED Provider Notes (Signed)
Emergency Medicine Provider Triage Evaluation Note  Sonya Frazier , a 25 y.o. female  was evaluated in triage.  Pt complains of abdominal pain to the right side. Pregnancy test positive at home but two negative tests while at [facility] hospital today. Prior hx of tubal ligation, and her surgeon told "you need an ultrasound". RLQ pain which has been worsening over the last week.   Review of Systems  Positive: Abdominal pain, nausea Negative: vomiting  Physical Exam  BP (!) 132/105 (BP Location: Right Arm)   Pulse 88   Temp 98.3 F (36.8 C) (Oral)   Resp 17   Ht 5' (1.524 m)   Wt 90.3 kg   LMP 02/01/2021   SpO2 100%   BMI 38.86 kg/m  Gen:   Awake, no distress   Resp:  Normal effort  MSK:   Moves extremities without difficulty  Other:    Medical Decision Making  Medically screening exam initiated at 9:44 PM.  Appropriate orders placed.  Kirstie Peri Sze was informed that the remainder of the evaluation will be completed by another provider, this initial triage assessment does not replace that evaluation, and the importance of remaining in the ED until their evaluation is complete.     Claude Manges, PA-C 02/24/21 2144    Tilden Fossa, MD 02/24/21 [phone]

## 2021-02-24 NOTE — ED Triage Notes (Signed)
Pt states she took a pregnancy test last week and was positive. Pt went to OB/GYN and was tested, test was negative. Pt has hx of ectopic pregnancy. Pt is requesting an ultrasound to confirm pt is not pregnancy. Pt states she is worried due to the lower abdominal pain she is having. Pt states pain has worsened over the last week.

## 2021-02-25 ENCOUNTER — Encounter (HOSPITAL_COMMUNITY): Payer: Self-pay

## 2021-02-25 ENCOUNTER — Emergency Department (HOSPITAL_COMMUNITY)
Admission: EM | Admit: 2021-02-25 | Discharge: 2021-02-25 | Disposition: A | Discharge status: Home / Self Care | Payer: Medicaid Other | Source: Home / Self Care

## 2021-02-25 ENCOUNTER — Other Ambulatory Visit: Payer: Self-pay

## 2021-02-25 DIAGNOSIS — Z5321 Procedure and treatment not carried out due to patient leaving prior to being seen by health care provider: Secondary | ICD-10-CM | POA: Insufficient documentation

## 2021-02-25 DIAGNOSIS — R1031 Right lower quadrant pain: Secondary | ICD-10-CM | POA: Diagnosis not present

## 2021-02-25 LAB — BASIC METABOLIC PANEL
Anion gap: 10 (ref 5–15)
BUN: 16 mg/dL (ref 6–20)
CO2: 26 mmol/L (ref 22–32)
Calcium: 10 mg/dL (ref 8.9–10.3)
Chloride: 106 mmol/L (ref 98–111)
Creatinine, Ser: 0.7 mg/dL (ref 0.44–1.00)
GFR, Estimated: >60 mL/min (ref >60)
Glucose, Bld: 102 mg/dL — ABNORMAL HIGH (ref 70–99)
Potassium: 3.6 mmol/L (ref 3.5–5.1)
Sodium: 142 mmol/L (ref 135–145)

## 2021-02-25 LAB — CBC WITH DIFFERENTIAL/PLATELET
Abs Immature Granulocytes: 0.04 10*3/uL (ref 0.00–0.07)
Basophils Absolute: 0 10*3/uL (ref 0.0–0.1)
Basophils Relative: 0 %
Eosinophils Absolute: 0.2 10*3/uL (ref 0.0–0.5)
Eosinophils Relative: 1 %
HCT: 38.2 % (ref 36.0–46.0)
Hemoglobin: 12.2 g/dL (ref 12.0–15.0)
Immature Granulocytes: 0 %
Lymphocytes Relative: 23 %
Lymphs Abs: 3.2 10*3/uL (ref 0.7–4.0)
MCH: 28.1 pg (ref 26.0–34.0)
MCHC: 31.9 g/dL (ref 30.0–36.0)
MCV: 88 fL (ref 80.0–100.0)
Monocytes Absolute: 0.9 10*3/uL (ref 0.1–1.0)
Monocytes Relative: 6 %
Neutro Abs: 9.4 10*3/uL — ABNORMAL HIGH (ref 1.7–7.7)
Neutrophils Relative %: 70 %
Platelets: 380 10*3/uL (ref 150–400)
RBC: 4.34 MIL/uL (ref 3.87–5.11)
RDW: 12.9 % (ref 11.5–15.5)
WBC: 13.6 10*3/uL — ABNORMAL HIGH (ref 4.0–10.5)
nRBC: 0 % (ref 0.0–0.2)

## 2021-02-25 NOTE — ED Notes (Signed)
Pt turned in her stickers and left the facility.

## 2021-02-25 NOTE — ED Triage Notes (Signed)
Pt to er, pt states that she is here because, states that she took a home pregnancy test and it was positive, [facility], states that she has a hx of ectopic with a R sided tube removal.  [facility] and blood but didn't do an ultrasound.  Pt states that she continues to has RLQ pain, states that she started having some spotting yesterday, and now that has stopped but still having the pain.

## 2021-02-25 NOTE — ED Provider Notes (Signed)
Emergency Medicine Provider Triage Evaluation Note  Seda Kronberg , a 25 y.o. female  was evaluated in triage.  Pt complains of right lower abdominal pain, going on for last 2 to 3 weeks, pain is constant, does not radiate, not associated with nausea, vomiting, diarrhea, she denies urinary symptoms, denies vaginal discharge, vaginal bleeding.  Patient's had a right-sided ectopic pregnancy, right right fallopian tube has been removed, she is told by her surgeon that she will need a ultrasound if she continues to have pain.  Patient seen here yesterday, she had negative hCG, slightly elevated white count, CMP unremarkable..  Review of Systems  Positive: Right abdominal pain, Negative: Nausea, vomiting  Physical Exam  BP 119/74 (BP Location: Left Arm)   Pulse (!) 103   Temp 98.4 F (36.9 C) (Oral)   Resp 18   Ht 5' (1.524 m)   Wt 90.3 kg   LMP 02/01/2021   SpO2 100%   BMI 38.86 kg/m  Gen:   Awake, no distress   Resp:  Normal effort  MSK:   Moves extremities without difficulty  Other:    Medical Decision Making  Medically screening exam initiated at 1:32 PM.  Appropriate orders placed.  Kirstie Peri Iturralde was informed that the remainder of the evaluation will be completed by another provider, this initial triage assessment does not replace that evaluation, and the importance of remaining in the ED until their evaluation is complete.  Presents with abdominal pain, patient will need further work-up here in the emergency department.   Carroll Sage, PA-C 02/25/21 1333    Rolan Bucco, MD 02/25/21 1355

## 2021-03-03 ENCOUNTER — Other Ambulatory Visit: Payer: Self-pay

## 2021-03-03 ENCOUNTER — Ambulatory Visit
Admission: RE | Admit: 2021-03-03 | Discharge: 2021-03-03 | Disposition: A | Discharge status: Home / Self Care | Payer: Medicaid Other | Source: Ambulatory Visit | Attending: Physician Assistant | Admitting: Physician Assistant

## 2021-03-03 DIAGNOSIS — Z419 Encounter for procedure for purposes other than remedying health state, unspecified: Secondary | ICD-10-CM | POA: Diagnosis not present

## 2021-03-03 DIAGNOSIS — R102 Pelvic and perineal pain: Secondary | ICD-10-CM

## 2021-03-11 DIAGNOSIS — N83202 Unspecified ovarian cyst, left side: Secondary | ICD-10-CM | POA: Diagnosis not present

## 2021-03-11 DIAGNOSIS — E669 Obesity, unspecified: Secondary | ICD-10-CM | POA: Diagnosis not present

## 2021-03-11 DIAGNOSIS — N920 Excessive and frequent menstruation with regular cycle: Secondary | ICD-10-CM | POA: Diagnosis not present

## 2021-03-11 DIAGNOSIS — Z8759 Personal history of other complications of pregnancy, childbirth and the puerperium: Secondary | ICD-10-CM | POA: Diagnosis not present

## 2021-03-11 DIAGNOSIS — R030 Elevated blood-pressure reading, without diagnosis of hypertension: Secondary | ICD-10-CM | POA: Diagnosis not present

## 2021-03-11 DIAGNOSIS — N83201 Unspecified ovarian cyst, right side: Secondary | ICD-10-CM | POA: Diagnosis not present

## 2021-04-03 DIAGNOSIS — Z419 Encounter for procedure for purposes other than remedying health state, unspecified: Secondary | ICD-10-CM | POA: Diagnosis not present

## 2021-04-21 DIAGNOSIS — N83202 Unspecified ovarian cyst, left side: Secondary | ICD-10-CM | POA: Diagnosis not present

## 2021-04-21 DIAGNOSIS — E669 Obesity, unspecified: Secondary | ICD-10-CM | POA: Diagnosis not present

## 2021-04-21 DIAGNOSIS — R600 Localized edema: Secondary | ICD-10-CM | POA: Diagnosis not present

## 2021-04-21 DIAGNOSIS — N920 Excessive and frequent menstruation with regular cycle: Secondary | ICD-10-CM | POA: Diagnosis not present

## 2021-04-21 DIAGNOSIS — R030 Elevated blood-pressure reading, without diagnosis of hypertension: Secondary | ICD-10-CM | POA: Diagnosis not present

## 2021-04-21 DIAGNOSIS — N83201 Unspecified ovarian cyst, right side: Secondary | ICD-10-CM | POA: Diagnosis not present

## 2021-04-21 DIAGNOSIS — Z8759 Personal history of other complications of pregnancy, childbirth and the puerperium: Secondary | ICD-10-CM | POA: Diagnosis not present

## 2021-04-22 DIAGNOSIS — R600 Localized edema: Secondary | ICD-10-CM | POA: Diagnosis not present

## 2021-04-28 DIAGNOSIS — R030 Elevated blood-pressure reading, without diagnosis of hypertension: Secondary | ICD-10-CM | POA: Diagnosis not present

## 2021-04-28 DIAGNOSIS — N179 Acute kidney failure, unspecified: Secondary | ICD-10-CM | POA: Diagnosis not present

## 2021-04-28 DIAGNOSIS — R600 Localized edema: Secondary | ICD-10-CM | POA: Diagnosis not present

## 2021-04-28 DIAGNOSIS — N83201 Unspecified ovarian cyst, right side: Secondary | ICD-10-CM | POA: Diagnosis not present

## 2021-04-28 DIAGNOSIS — Z8759 Personal history of other complications of pregnancy, childbirth and the puerperium: Secondary | ICD-10-CM | POA: Diagnosis not present

## 2021-04-28 DIAGNOSIS — N83202 Unspecified ovarian cyst, left side: Secondary | ICD-10-CM | POA: Diagnosis not present

## 2021-04-28 DIAGNOSIS — E669 Obesity, unspecified: Secondary | ICD-10-CM | POA: Diagnosis not present

## 2021-04-28 DIAGNOSIS — N920 Excessive and frequent menstruation with regular cycle: Secondary | ICD-10-CM | POA: Diagnosis not present

## 2021-05-03 DIAGNOSIS — Z419 Encounter for procedure for purposes other than remedying health state, unspecified: Secondary | ICD-10-CM | POA: Diagnosis not present

## 2021-06-03 DIAGNOSIS — Z419 Encounter for procedure for purposes other than remedying health state, unspecified: Secondary | ICD-10-CM | POA: Diagnosis not present

## 2021-06-13 ENCOUNTER — Ambulatory Visit (INDEPENDENT_AMBULATORY_CARE_PROVIDER_SITE_OTHER): Payer: Medicaid Other | Admitting: Family

## 2021-06-13 ENCOUNTER — Other Ambulatory Visit: Payer: Self-pay

## 2021-06-13 ENCOUNTER — Encounter: Payer: Self-pay | Admitting: Family

## 2021-06-13 VITALS — BP 112/68 | HR 86 | Ht 60.0 in | Wt 197.7 lb

## 2021-06-13 DIAGNOSIS — N920 Excessive and frequent menstruation with regular cycle: Secondary | ICD-10-CM | POA: Diagnosis not present

## 2021-06-13 DIAGNOSIS — R102 Pelvic and perineal pain: Secondary | ICD-10-CM

## 2021-06-13 LAB — CBC
Hematocrit: 36 % (ref 34.0–46.6)
Hemoglobin: 11.4 g/dL (ref 11.1–15.9)
MCH: 26.3 pg — ABNORMAL LOW (ref 26.6–33.0)
MCHC: 31.7 g/dL (ref 31.5–35.7)
MCV: 83 fL (ref 79–97)
Platelets: 411 10*3/uL (ref 150–450)
RBC: 4.34 x10E6/uL (ref 3.77–5.28)
RDW: 13.1 % (ref 11.7–15.4)
WBC: 8.3 10*3/uL (ref 3.4–10.8)

## 2021-06-13 MED ORDER — IBUPROFEN 600 MG PO TABS: 600.0000 mg | ORAL_TABLET | Freq: Two times a day (BID) | ORAL | 1 refills | Status: DC | PRN

## 2021-06-13 NOTE — Patient Instructions (Signed)
Avoid acetaminophen or Tylenol until kidneys assessed.

## 2021-06-13 NOTE — Progress Notes (Signed)
History:  [name] is a 25 y.o. [id number] who presents to clinic today for follow-up related to RLQ pelvic pain.  Diagnosed with bilat anechoic follicles, consistent with polycystic ovarian syndrome.  Pain in RLQ was hurting QOD beginning 2 months ago.  Pain described as sharp, rated 10/10 at that time.  Pain subsided and returned this morning.  Today 5/10.    Also reports history of irregular bleeding x 1 year since discontinuing depo provera.  On depo x 3 years.  Cycles were every month x 5 days.  Bleeding is described as heavy, with +blood clots.  Minimal pain with cycle.  Only pain has been with the cyst.  Patient's last menstrual period was 05/27/2021 (exact date). Desires pregnancy.   Also reports issues identified with kidneys (non [facility] provider); will be initiating care at [facility] on 112/12/22.  Sister with lupus; family history of renal disease.    The following portions of the patient's history were reviewed and updated as appropriate: allergies, current medications, family history, past medical history, social history, past surgical history and problem list.  Review of Systems:  Review of Systems  Constitutional: Negative.   HENT: Negative.    Respiratory: Negative.    Cardiovascular: Negative.   Gastrointestinal: Negative.   Genitourinary:  Positive for pelvic pain and vaginal bleeding.       Irregular vaginal bleeding  Musculoskeletal: Negative.       Objective:  Physical Exam BP 112/68   Pulse 86   Ht 5' (1.524 m)   Wt 197 lb 11.2 oz (89.7 kg)   LMP 05/27/2021 (Exact Date)   Breastfeeding No   BMI 38.61 kg/m  Physical Exam Constitutional:      General: She is not in acute distress.    Appearance: She is well-developed.  HENT:     Head: Normocephalic.  Musculoskeletal:     Cervical back: Normal range of motion and neck supple.  Neurological:     Mental Status: She is alert and oriented to person, place, and time.      Labs and Imaging No results found for this or any previous visit (from the past 24 hour(s)).  No results found.   Assessment & Plan:   Ultrasound - Suspect Polycystic Ovarian Syndrome Desires pregnancy Menorrhagia  Plan: - Keep appointment with [facility] for renal assessment and primary care - CBC  - RX Ibuprofen for pelvic pain and bleeding - Recommended folic acid - Follow-up in 8 weeks for prepregnancy counseling  Time with patient:  22 min   Amedeo Gory, CNM 06/13/2021 9:42 AM

## 2021-06-23 NOTE — Progress Notes (Signed)
Message sent to patient indicated hgb level within normal limits.

## 2021-07-03 DIAGNOSIS — Z419 Encounter for procedure for purposes other than remedying health state, unspecified: Secondary | ICD-10-CM | POA: Diagnosis not present

## 2021-07-14 ENCOUNTER — Encounter: Payer: Self-pay | Admitting: Nurse Practitioner

## 2021-07-14 ENCOUNTER — Other Ambulatory Visit: Payer: Self-pay

## 2021-07-14 ENCOUNTER — Ambulatory Visit: Payer: Medicaid Other | Attending: Nurse Practitioner | Admitting: Nurse Practitioner

## 2021-07-14 VITALS — BP 104/72 | HR 73 | Ht 60.0 in | Wt 201.2 lb

## 2021-07-14 DIAGNOSIS — Z7689 Persons encountering health services in other specified circumstances: Secondary | ICD-10-CM

## 2021-07-14 DIAGNOSIS — Z87828 Personal history of other (healed) physical injury and trauma: Secondary | ICD-10-CM | POA: Diagnosis not present

## 2021-07-14 DIAGNOSIS — D649 Anemia, unspecified: Secondary | ICD-10-CM

## 2021-07-14 DIAGNOSIS — K5909 Other constipation: Secondary | ICD-10-CM

## 2021-07-14 MED ORDER — SENNOSIDES-DOCUSATE SODIUM 8.6-50 MG PO TABS: 2.0000 | ORAL_TABLET | Freq: Two times a day (BID) | ORAL | 3 refills | Status: AC

## 2021-07-14 NOTE — Progress Notes (Signed)
Assessment & Plan:  Sonya Frazier was seen today for establish care.  Diagnoses and all orders for this visit:  Encounter to establish care  Anemia, unspecified type -     CMP14+EGFR -     TSH -     Iron, TIBC and Ferritin Panel  Chronic constipation -     senna-docusate (SENOKOT-S) 8.6-50 MG tablet; Take 2 tablets by mouth 2 (two) times daily. -     TSH  History of kidney injury -     CMP14+EGFR   Patient has been counseled on age-appropriate routine health concerns for screening and prevention. These are reviewed and up-to-date. Referrals have been placed accordingly. Immunizations are up-to-date or declined.    Subjective:   Chief Complaint  Patient presents with   Establish Care   HPI Sonya Frazier 25 y.o. female presents to office today to establish care.   She has complaints of intermittent BLE swelling.  Does not endorse chest pain or worsening shortness of breath.  Low-sodium diet and water intake are not optimal.  BMI 39  States she was told in the past that something was wrong with her kidneys.  Most recent creatinine was normal and I have discussed this with her.  She has chronic constipation.  Is currently not taking any stool softeners for this.  Review of Systems  Constitutional:  Negative for fever, malaise/fatigue and weight loss.  HENT: Negative.  Negative for nosebleeds.   Eyes: Negative.  Negative for blurred vision, double vision and photophobia.  Respiratory: Negative.  Negative for cough, shortness of breath and wheezing.   Cardiovascular:  Positive for leg swelling. Negative for chest pain, palpitations and PND.  Gastrointestinal:  Positive for constipation. Negative for abdominal pain, blood in stool, diarrhea, heartburn, nausea and vomiting.  Musculoskeletal: Negative.  Negative for myalgias.  Neurological: Negative.  Negative for dizziness, focal weakness, seizures and headaches.  Psychiatric/Behavioral: Negative.  Negative for suicidal  ideas.    Past Medical History:  Diagnosis Date   Anemia    COVID-19 virus infection 01/19/2019   Asymptomatic  01/13/19   Ectopic pregnancy, tubal 01/13/2019   Live 8.0 wk RT ectopic preg measuring 4.5 x 2.9 x 3.1 cm  HR of 177 bpm   Preeclampsia     Past Surgical History:  Procedure Laterality Date   CESAREAN SECTION N/A 09/10/2017   Procedure: CESAREAN SECTION;  Surgeon: Florian Buff, MD;  Location: [place];  Service: Obstetrics;  Laterality: N/A;   LAPAROSCOPIC UNILATERAL SALPINGECTOMY Right 01/13/2019   Procedure: LAPAROSCOPIC UNILATERAL SALPINGECTOMY;  Surgeon: Florian Buff, MD;  Location: [place];  Service: Gynecology;  Laterality: Right;   WISDOM TOOTH EXTRACTION      Family History  Problem Relation Age of Onset   Asthma Mother    Cancer Maternal Aunt     Social History Reviewed with no changes to be made today.   Outpatient Medications Prior to Visit  Medication Sig Dispense Refill   folic acid (FOLVITE) 1 MG tablet Take 1 tablet (1 mg total) by mouth daily. 60 tablet 2   ibuprofen (ADVIL) 600 MG tablet Take 1 tablet (600 mg total) by mouth 2 (two) times daily as needed (with start of menses or pelvic pain). 30 tablet 1   acetaminophen (TYLENOL) 325 MG tablet Take 650 mg by mouth every 6 (six) hours as needed for moderate pain. (Patient not taking: Reported on 07/14/2021)     norethindrone (AYGESTIN) 5 MG tablet 1 tab po 2x/day  for 5 days starting on the first day of your period (Patient not taking: Reported on 07/14/2021) 30 tablet 2   No facility-administered medications prior to visit.    No Known Allergies     Objective:    BP 104/72   Pulse 73   Ht 5' (1.524 m)   Wt 201 lb 4 oz (91.3 kg)   LMP 06/28/2021   SpO2 100%   BMI 39.30 kg/m  Wt Readings from Last 3 Encounters:  07/14/21 201 lb 4 oz (91.3 kg)  06/13/21 197 lb 11.2 oz (89.7 kg)  02/25/21 199 lb (90.3 kg)    Physical Exam Vitals and nursing note reviewed.  Constitutional:       Appearance: She is well-developed.  HENT:     Head: Normocephalic and atraumatic.  Cardiovascular:     Rate and Rhythm: Normal rate and regular rhythm.     Heart sounds: Normal heart sounds. No murmur heard.   No friction rub. No gallop.  Pulmonary:     Effort: Pulmonary effort is normal. No tachypnea or respiratory distress.     Breath sounds: Normal breath sounds. No decreased breath sounds, wheezing, rhonchi or rales.  Chest:     Chest wall: No tenderness.  Abdominal:     General: Abdomen is protuberant. Bowel sounds are normal. There is no distension.     Palpations: Abdomen is soft.  Musculoskeletal:        General: Normal range of motion.     Cervical back: Normal range of motion.     Right lower leg: No edema.     Left lower leg: No edema.  Skin:    General: Skin is warm and dry.  Neurological:     Mental Status: She is alert and oriented to person, place, and time.     Coordination: Coordination normal.  Psychiatric:        Behavior: Behavior normal. Behavior is cooperative.        Thought Content: Thought content normal.        Judgment: Judgment normal.         Patient has been counseled extensively about nutrition and exercise as well as the importance of adherence with medications and regular follow-up. The patient was given clear instructions to go to ER or return to medical center if symptoms don't improve, worsen or new problems develop. The patient verbalized understanding.   Follow-up: Return if symptoms worsen or fail to improve.   Gildardo Pounds, FNP-BC [facility] and [facility], [place]   07/14/2021, 1:00 PM

## 2021-07-15 LAB — CMP14+EGFR
ALT: 11 IU/L (ref 0–32)
AST: 13 IU/L (ref 0–40)
Albumin/Globulin Ratio: 1.1 — ABNORMAL LOW (ref 1.2–2.2)
Albumin: 3.9 g/dL (ref 3.9–5.0)
Alkaline Phosphatase: 103 IU/L (ref 44–121)
BUN/Creatinine Ratio: 16 (ref 9–23)
BUN: 13 mg/dL (ref 6–20)
Bilirubin Total: 0.2 mg/dL (ref 0.0–1.2)
CO2: 21 mmol/L (ref 20–29)
Calcium: 9.2 mg/dL (ref 8.7–10.2)
Chloride: 100 mmol/L (ref 96–106)
Creatinine, Ser: 0.8 mg/dL (ref 0.57–1.00)
Globulin, Total: 3.7 g/dL (ref 1.5–4.5)
Glucose: 84 mg/dL (ref 70–99)
Potassium: 4.2 mmol/L (ref 3.5–5.2)
Sodium: 134 mmol/L (ref 134–144)
Total Protein: 7.6 g/dL (ref 6.0–8.5)
eGFR: 104 mL/min/{1.73_m2} (ref >59)

## 2021-07-15 LAB — IRON,TIBC AND FERRITIN PANEL
Ferritin: 12 ng/mL — ABNORMAL LOW (ref 15–150)
Iron Saturation: 7 % — CL (ref 15–55)
Iron: 29 ug/dL (ref 27–159)
Total Iron Binding Capacity: 397 ug/dL (ref 250–450)
UIBC: 368 ug/dL (ref 131–425)

## 2021-07-15 LAB — TSH: TSH: 2.03 u[IU]/mL (ref 0.450–4.500)

## 2021-07-18 ENCOUNTER — Other Ambulatory Visit: Payer: Self-pay | Admitting: Nurse Practitioner

## 2021-07-18 MED ORDER — IRON (FERROUS SULFATE) 325 (65 FE) MG PO TABS: 325.0000 mg | ORAL_TABLET | Freq: Every day | ORAL | 6 refills | Status: AC

## 2021-08-03 DIAGNOSIS — Z419 Encounter for procedure for purposes other than remedying health state, unspecified: Secondary | ICD-10-CM | POA: Diagnosis not present

## 2021-08-08 ENCOUNTER — Ambulatory Visit (INDEPENDENT_AMBULATORY_CARE_PROVIDER_SITE_OTHER): Payer: Medicaid Other | Admitting: Family

## 2021-08-08 ENCOUNTER — Other Ambulatory Visit: Payer: Self-pay

## 2021-08-08 VITALS — BP 127/74 | HR 75 | Wt 220.5 lb

## 2021-08-08 DIAGNOSIS — N920 Excessive and frequent menstruation with regular cycle: Secondary | ICD-10-CM

## 2021-08-08 LAB — CBC
Hematocrit: 34.2 % (ref 34.0–46.6)
Hemoglobin: 11.3 g/dL (ref 11.1–15.9)
MCH: 26.3 pg — ABNORMAL LOW (ref 26.6–33.0)
MCHC: 33 g/dL (ref 31.5–35.7)
MCV: 80 fL (ref 79–97)
Platelets: 398 10*3/uL (ref 150–450)
RBC: 4.3 x10E6/uL (ref 3.77–5.28)
RDW: 13.3 % (ref 11.7–15.4)
WBC: 8.3 10*3/uL (ref 3.4–10.8)

## 2021-08-08 NOTE — Progress Notes (Signed)
History:  [name] is a 26 y.o. [id number] who presents to clinic today for follow-up.  Completed visit with NP at [facility], informed normal kidney function.  Concerned today due to the increased bleeding with LMP. One menses since last visit, bled 5 days, however bled thru depends.  Used 3-4 per day x 3 days.  Bleeding slowed down on day #4.  No bleeding since.  +food changes since December 2022.  +Keto diet, going well.  Walk every day for approximately 1 hour.  Reports mild dizziness and occasional lightheadedness.     The following portions of the patient's history were reviewed and updated as appropriate: allergies, current medications, family history, past medical history, social history, past surgical history and problem list.  Review of Systems:  Review of Systems  Constitutional:  Negative for fatigue and fever.  HENT: Negative.    Respiratory:  Negative for shortness of breath.   Cardiovascular: Negative.   Gastrointestinal:  Negative for abdominal pain.  Genitourinary:  Negative for dysuria, pelvic pain and vaginal bleeding.  Neurological:  Positive for dizziness and light-headedness. Negative for headaches.   No signs of hirsutism    Objective:  Physical Exam BP 127/74    Pulse 75    Wt 220 lb 8 oz (100 kg)    LMP 07/27/2021 (Exact Date) Comment: patient states her periods are very heavy with golf ball sized clots   BMI 43.06 kg/m  Physical Exam Constitutional:      General: She is not in acute distress.    Appearance: She is well-developed.  HENT:     Head: Normocephalic and atraumatic.  Neck:     Thyroid: No thyromegaly.  Abdominal:     General: Bowel sounds are normal.  Musculoskeletal:     Cervical back: Normal range of motion and neck supple.  Skin:    General: Skin is warm and dry.  Neurological:     Mental Status: She is alert and oriented to person, place, and time.     Labs and Imaging No results found for this or any  previous visit (from the past 24 hour(s)).  No results found.   Assessment & Plan:   Menorrhagia Polycystic Ovarian Syndrome Appearance on Ultrasound   Plan: - CBC - Observe menstrual cycle; if does not normalize consider medication treatment   Amedeo Gory, CNM 08/08/2021 10:00 AM

## 2021-09-03 DIAGNOSIS — Z419 Encounter for procedure for purposes other than remedying health state, unspecified: Secondary | ICD-10-CM | POA: Diagnosis not present

## 2021-10-01 DIAGNOSIS — Z419 Encounter for procedure for purposes other than remedying health state, unspecified: Secondary | ICD-10-CM | POA: Diagnosis not present

## 2021-11-01 DIAGNOSIS — Z419 Encounter for procedure for purposes other than remedying health state, unspecified: Secondary | ICD-10-CM | POA: Diagnosis not present

## 2021-11-28 DIAGNOSIS — Z0000 Encounter for general adult medical examination without abnormal findings: Secondary | ICD-10-CM | POA: Diagnosis not present

## 2021-11-28 DIAGNOSIS — Z131 Encounter for screening for diabetes mellitus: Secondary | ICD-10-CM | POA: Diagnosis not present

## 2021-11-28 DIAGNOSIS — Z13220 Encounter for screening for lipoid disorders: Secondary | ICD-10-CM | POA: Diagnosis not present

## 2021-11-28 DIAGNOSIS — E669 Obesity, unspecified: Secondary | ICD-10-CM | POA: Diagnosis not present

## 2021-11-28 DIAGNOSIS — N920 Excessive and frequent menstruation with regular cycle: Secondary | ICD-10-CM | POA: Diagnosis not present

## 2021-11-28 DIAGNOSIS — Z8759 Personal history of other complications of pregnancy, childbirth and the puerperium: Secondary | ICD-10-CM | POA: Diagnosis not present

## 2021-12-01 DIAGNOSIS — Z419 Encounter for procedure for purposes other than remedying health state, unspecified: Secondary | ICD-10-CM | POA: Diagnosis not present

## 2021-12-12 DIAGNOSIS — R079 Chest pain, unspecified: Secondary | ICD-10-CM | POA: Diagnosis not present

## 2021-12-12 DIAGNOSIS — D649 Anemia, unspecified: Secondary | ICD-10-CM | POA: Diagnosis not present

## 2021-12-12 DIAGNOSIS — E669 Obesity, unspecified: Secondary | ICD-10-CM | POA: Diagnosis not present

## 2021-12-12 DIAGNOSIS — E782 Mixed hyperlipidemia: Secondary | ICD-10-CM | POA: Diagnosis not present

## 2021-12-26 DIAGNOSIS — D508 Other iron deficiency anemias: Secondary | ICD-10-CM | POA: Diagnosis not present

## 2021-12-26 DIAGNOSIS — E669 Obesity, unspecified: Secondary | ICD-10-CM | POA: Diagnosis not present

## 2021-12-26 DIAGNOSIS — E782 Mixed hyperlipidemia: Secondary | ICD-10-CM | POA: Diagnosis not present

## 2022-01-01 DIAGNOSIS — Z419 Encounter for procedure for purposes other than remedying health state, unspecified: Secondary | ICD-10-CM | POA: Diagnosis not present

## 2022-01-29 ENCOUNTER — Encounter (HOSPITAL_BASED_OUTPATIENT_CLINIC_OR_DEPARTMENT_OTHER): Payer: Self-pay

## 2022-01-29 ENCOUNTER — Emergency Department (HOSPITAL_BASED_OUTPATIENT_CLINIC_OR_DEPARTMENT_OTHER)
Admission: EM | Admit: 2022-01-29 | Discharge: 2022-01-30 | Disposition: A | Discharge status: Home / Self Care | Payer: Medicaid Other | Attending: Emergency Medicine | Admitting: Emergency Medicine

## 2022-01-29 ENCOUNTER — Other Ambulatory Visit: Payer: Self-pay

## 2022-01-29 DIAGNOSIS — Z8616 Personal history of COVID-19: Secondary | ICD-10-CM | POA: Diagnosis not present

## 2022-01-29 DIAGNOSIS — A599 Trichomoniasis, unspecified: Secondary | ICD-10-CM | POA: Diagnosis not present

## 2022-01-29 DIAGNOSIS — A5901 Trichomonal vulvovaginitis: Secondary | ICD-10-CM

## 2022-01-29 DIAGNOSIS — R102 Pelvic and perineal pain: Secondary | ICD-10-CM | POA: Diagnosis not present

## 2022-01-29 LAB — URINALYSIS, ROUTINE W REFLEX MICROSCOPIC
Bilirubin Urine: NEGATIVE
Glucose, UA: NEGATIVE mg/dL
Hgb urine dipstick: NEGATIVE
Ketones, ur: NEGATIVE mg/dL
Nitrite: NEGATIVE
Protein, ur: NEGATIVE mg/dL
Specific Gravity, Urine: 1.026 (ref 1.005–1.030)
pH: 6.5 (ref 5.0–8.0)

## 2022-01-29 LAB — COMPREHENSIVE METABOLIC PANEL
ALT: 13 U/L (ref 0–44)
AST: 15 U/L (ref 15–41)
Albumin: 4 g/dL (ref 3.5–5.0)
Alkaline Phosphatase: 72 U/L (ref 38–126)
Anion gap: 11 (ref 5–15)
BUN: 18 mg/dL (ref 6–20)
CO2: 23 mmol/L (ref 22–32)
Calcium: 9.6 mg/dL (ref 8.9–10.3)
Chloride: 103 mmol/L (ref 98–111)
Creatinine, Ser: 0.82 mg/dL (ref 0.44–1.00)
GFR, Estimated: >60 mL/min (ref >60)
Glucose, Bld: 103 mg/dL — ABNORMAL HIGH (ref 70–99)
Potassium: 4 mmol/L (ref 3.5–5.1)
Sodium: 137 mmol/L (ref 135–145)
Total Bilirubin: 0.2 mg/dL — ABNORMAL LOW (ref 0.3–1.2)
Total Protein: 8.2 g/dL — ABNORMAL HIGH (ref 6.5–8.1)

## 2022-01-29 LAB — CBC
HCT: 33.6 % — ABNORMAL LOW (ref 36.0–46.0)
Hemoglobin: 10.5 g/dL — ABNORMAL LOW (ref 12.0–15.0)
MCH: 25.1 pg — ABNORMAL LOW (ref 26.0–34.0)
MCHC: 31.3 g/dL (ref 30.0–36.0)
MCV: 80.4 fL (ref 80.0–100.0)
Platelets: 395 10*3/uL (ref 150–400)
RBC: 4.18 MIL/uL (ref 3.87–5.11)
RDW: 14.3 % (ref 11.5–15.5)
WBC: 14.5 10*3/uL — ABNORMAL HIGH (ref 4.0–10.5)
nRBC: 0 % (ref 0.0–0.2)

## 2022-01-29 LAB — LIPASE, BLOOD: Lipase: 23 U/L (ref 11–51)

## 2022-01-29 LAB — PREGNANCY, URINE: Preg Test, Ur: NEGATIVE

## 2022-01-29 NOTE — ED Triage Notes (Signed)
Patient here POV from Home.  Endorses Pelvic Pain that began approximately 1 Week PTA. Worsening since it began and is mainly located to Lower Region and radiates to Back.   No N/V. Moderate Diarrhea. Constipation prior to Diarrhea. No Fevers. No Urinary Symptoms.   History of Ectopic Pregnancy 2 Years ago.  NAD Noted during Triage. A&Ox4. GCS 15. Ambulatory.

## 2022-01-30 ENCOUNTER — Emergency Department (HOSPITAL_BASED_OUTPATIENT_CLINIC_OR_DEPARTMENT_OTHER): Payer: Medicaid Other

## 2022-01-30 DIAGNOSIS — R102 Pelvic and perineal pain: Secondary | ICD-10-CM | POA: Diagnosis not present

## 2022-01-30 LAB — WET PREP, GENITAL
Sperm: NONE SEEN
WBC, Wet Prep HPF POC: >=10 — AB (ref <10)
Yeast Wet Prep HPF POC: NONE SEEN

## 2022-01-30 MED ORDER — METRONIDAZOLE 500 MG PO TABS
500.0000 mg | ORAL_TABLET | Freq: Once | ORAL | Status: AC
  Administered 2022-01-30: 500 mg via ORAL
  Filled 2022-01-30: qty 1

## 2022-01-30 MED ORDER — HYDROCODONE-ACETAMINOPHEN 5-325 MG PO TABS
1.0000 | ORAL_TABLET | Freq: Once | ORAL | Status: AC
  Administered 2022-01-30: 1 via ORAL
  Filled 2022-01-30: qty 1

## 2022-01-30 MED ORDER — METRONIDAZOLE 500 MG PO TABS: 500.0000 mg | ORAL_TABLET | Freq: Two times a day (BID) | ORAL | 0 refills | Status: AC

## 2022-01-30 NOTE — ED Notes (Signed)
Discharge instructions discussed with pt. Pt verbalized understanding with no questions at this time.  

## 2022-01-30 NOTE — ED Provider Notes (Signed)
[facility] Provider Note: Sonya Dell, MD, FACEP  CSN: [id number] MRN: [id number] ARRIVAL: 01/29/22 at 1939 ROOM: DB014/DB014   CHIEF COMPLAINT  Abdominal Pain   HISTORY OF PRESENT ILLNESS  01/30/22 12:25 AM Sonya Frazier is a 26 y.o. female with 1-2 weeks of pelvic pain.  It began towards the end of her most recent menstrual period, 01/21/2022.  It has gradually worsened since.  She now rates it as an 8 out of 10.  It is located on the right side which is the side on which she had a salpingectomy for an ectopic pregnancy 01/13/2019.  The pain is well localized and low in the pelvis.  It is not worse with palpation or movement.  She denies vaginal discharge   Past Medical History:  Diagnosis Date   Anemia    COVID-19 virus infection 01/19/2019   Asymptomatic  01/13/19   Ectopic pregnancy, tubal 01/13/2019   Live 8.0 wk RT ectopic preg measuring 4.5 x 2.9 x 3.1 cm  HR of 177 bpm   Preeclampsia     Past Surgical History:  Procedure Laterality Date   CESAREAN SECTION N/A 09/10/2017   Procedure: CESAREAN SECTION;  Surgeon: Lazaro Arms, MD;  Location: [facility];  Service: Obstetrics;  Laterality: N/A;   LAPAROSCOPIC UNILATERAL SALPINGECTOMY Right 01/13/2019   Procedure: LAPAROSCOPIC UNILATERAL SALPINGECTOMY;  Surgeon: Lazaro Arms, MD;  Location: [facility] OR;  Service: Gynecology;  Laterality: Right;   WISDOM TOOTH EXTRACTION      Family History  Problem Relation Age of Onset   Asthma Mother    Cancer Maternal Aunt     Social History   Tobacco Use   Smoking status: Never   Smokeless tobacco: Never  Vaping Use   Vaping Use: Never used  Substance Use Topics   Alcohol use: Yes    Comment: occ   Drug use: Not Currently    Types: Marijuana    Comment: Quit approx 1 year ago    Prior to Admission medications   Medication Sig Start Date End Date Taking? Authorizing Provider  metroNIDAZOLE (FLAGYL) 500 MG tablet Take 1 tablet (500 mg total) by  mouth 2 (two) times daily. One po bid x 7 days 01/30/22  Yes Fountain Derusha, MD  ibuprofen (ADVIL) 600 MG tablet Take 1 tablet (600 mg total) by mouth 2 (two) times daily as needed (with start of menses or pelvic pain). 06/13/21   Karim-Rhoades, Kae Heller, CNM  Iron, Ferrous Sulfate, 325 (65 Fe) MG TABS Take 325 mg by mouth daily. 07/18/21   Claiborne Rigg, NP    Allergies Patient has no known allergies.   REVIEW OF SYSTEMS  Negative except as noted here or in the History of Present Illness.   PHYSICAL EXAMINATION  Initial Vital Signs Blood pressure 125/71, pulse 90, temperature (!) 97.5 F (36.4 C), temperature source Temporal, resp. rate 16, height 5' (1.524 m), weight 100 kg, SpO2 100 %.  Examination General: Well-developed, well-nourished female in no acute distress; appearance consistent with age of record HENT: normocephalic; atraumatic Eyes: Normal appearance Neck: supple Heart: regular rate and rhythm Lungs: clear to auscultation bilaterally Abdomen: soft; nondistended; nontender; bowel sounds present GU: Normal external genitalia; no vaginal bleeding; white vaginal discharge; no cervical motion tenderness; right adnexal tenderness Extremities: No deformity; full range of motion; pulses normal Neurologic: Awake, alert and oriented; motor function intact in all extremities and symmetric; no facial droop Skin: Warm and dry Psychiatric: Normal mood and affect  RESULTS  Summary of this visit's results, reviewed and interpreted by myself:   EKG Interpretation  Date/Time:    Ventricular Rate:    PR Interval:    QRS Duration:   QT Interval:    QTC Calculation:   R Axis:     Text Interpretation:         Laboratory Studies: Results for orders placed or performed during the hospital encounter of 01/29/22 (from the past 24 hour(s))  Pregnancy, urine     Status: None   Collection Time: 01/29/22  7:52 PM  Result Value Ref Range   Preg Test, Ur NEGATIVE NEGATIVE   Lipase, blood     Status: None   Collection Time: 01/29/22  7:56 PM  Result Value Ref Range   Lipase 23 11 - 51 U/L  Comprehensive metabolic panel     Status: Abnormal   Collection Time: 01/29/22  7:56 PM  Result Value Ref Range   Sodium 137 135 - 145 mmol/L   Potassium 4.0 3.5 - 5.1 mmol/L   Chloride 103 98 - 111 mmol/L   CO2 23 22 - 32 mmol/L   Glucose, Bld 103 (H) 70 - 99 mg/dL   BUN 18 6 - 20 mg/dL   Creatinine, Ser 1.27 0.44 - 1.00 mg/dL   Calcium 9.6 8.9 - 51.7 mg/dL   Total Protein 8.2 (H) 6.5 - 8.1 g/dL   Albumin 4.0 3.5 - 5.0 g/dL   AST 15 15 - 41 U/L   ALT 13 0 - 44 U/L   Alkaline Phosphatase 72 38 - 126 U/L   Total Bilirubin 0.2 (L) 0.3 - 1.2 mg/dL   GFR, Estimated >00 >17 mL/min   Anion gap 11 5 - 15  CBC     Status: Abnormal   Collection Time: 01/29/22  7:56 PM  Result Value Ref Range   WBC 14.5 (H) 4.0 - 10.5 K/uL   RBC 4.18 3.87 - 5.11 MIL/uL   Hemoglobin 10.5 (L) 12.0 - 15.0 g/dL   HCT 49.4 (L) 49.6 - 75.9 %   MCV 80.4 80.0 - 100.0 fL   MCH 25.1 (L) 26.0 - 34.0 pg   MCHC 31.3 30.0 - 36.0 g/dL   RDW 16.3 84.6 - 65.9 %   Platelets 395 150 - 400 K/uL   nRBC 0.0 0.0 - 0.2 %  Urinalysis, Routine w reflex microscopic Urine, Clean Catch     Status: Abnormal   Collection Time: 01/29/22  7:56 PM  Result Value Ref Range   Color, Urine YELLOW YELLOW   APPearance CLEAR CLEAR   Specific Gravity, Urine 1.026 1.005 - 1.030   pH 6.5 5.0 - 8.0   Glucose, UA NEGATIVE NEGATIVE mg/dL   Hgb urine dipstick NEGATIVE NEGATIVE   Bilirubin Urine NEGATIVE NEGATIVE   Ketones, ur NEGATIVE NEGATIVE mg/dL   Protein, ur NEGATIVE NEGATIVE mg/dL   Nitrite NEGATIVE NEGATIVE   Leukocytes,Ua SMALL (A) NEGATIVE   RBC / HPF 0-5 0 - 5 RBC/hpf   WBC, UA 0-5 0 - 5 WBC/hpf   Squamous Epithelial / LPF 0-5 0 - 5   Mucus PRESENT   Wet prep, genital     Status: Abnormal   Collection Time: 01/30/22 12:29 AM   Specimen: Cervical / Endocervical swab  Result Value Ref Range   Yeast Wet Prep  HPF POC NONE SEEN NONE SEEN   Trich, Wet Prep PRESENT (A) NONE SEEN   Clue Cells Wet Prep HPF POC PRESENT (A) NONE SEEN  WBC, Wet Prep HPF POC >=10 (A) <10   Sperm NONE SEEN    Imaging Studies: US PELVIC COMPLETE W TRANSVAGINAL AND TORSION R/O  Result Date: 01/30/2022 CLINICAL DATA:  Pelvic pain for 1 week. Elevated white cell count. History of prior salpingectomy on the right. EXAM: TRANSABDOMINAL AND TRANSVAGINAL ULTRASOUND OF PELVIS DOPPLER ULTRASOUND OF OVARIES TECHNIQUE: Both transabdominal and transvaginal ultrasound examinations of the pelvis were performed. Transabdominal technique was performed for global imaging of the pelvis including uterus, ovaries, adnexal regions, and pelvic cul-de-sac. It was necessary to proceed with endovaginal exam following the transabdominal exam to visualize the ovaries and endometrium. Color and duplex Doppler ultrasound was utilized to evaluate blood flow to the ovaries. COMPARISON:  03/03/2021 FINDINGS: Uterus Measurements: 6.9 x 4 x 4.2 cm = volume: 61 mL. No fibroids or other mass visualized. Endometrium Thickness: 4 mm.  No focal abnormality visualized. Right ovary Measurements: 2 x 1.5 x 1.8 cm = volume: 3 mL. Normal appearance/no adnexal mass. Left ovary Measurements: 2.7 x 1.4 x 2.1 cm = volume: 4 mL. Normal appearance/no adnexal mass. Pulsed Doppler evaluation of both ovaries demonstrates normal low-resistance arterial and venous waveforms. Other findings No abnormal free fluid. IMPRESSION: Normal ultrasound appearance of the uterus and ovaries. No evidence of ovarian mass or torsion. No loculated collection seen. Electronically Signed   By: Burman Nieves M.D.   On: 01/30/2022 01:52    ED COURSE and MDM  Nursing notes, initial and subsequent vitals signs, including pulse oximetry, reviewed and interpreted by myself.  Vitals:   01/29/22 1946 01/29/22 1956 01/29/22 2328 01/30/22 0015  BP: 125/81  125/71 123/79  Pulse: 98  90 71  Resp: 16  16 17    Temp:  (!) 97.5 F (36.4 C)    TempSrc:  Temporal    SpO2: 100%  100% 100%  Weight: 100 kg     Height: 5' (1.524 m)      Medications  metroNIDAZOLE (FLAGYL) tablet 500 mg (has no administration in time range)  HYDROcodone-acetaminophen (NORCO/VICODIN) 5-325 MG per tablet 1 tablet (1 tablet Oral Given 01/30/22 0040)   2:16 AM Patient advised of reassuring ultrasound.  The cause of her pain is unclear but there is no evidence of an ovarian cyst or torsion.  She does test positive for trichomoniasis and we will treat her for this.  Gonorrhea and Chlamydia testing are pending.  Her significant other, who accompanies her, was advised to check in so that he may be treated for trichomoniasis as well.  The Flagyl will also treat bacterial vaginosis.   PROCEDURES  Procedures   ED DIAGNOSES     ICD-10-CM   1. Pelvic pain in female  R10.2     2. Trichomoniasis of vagina  A59.01          Judithann Villamar, MD 01/30/22 [phone]

## 2022-02-02 LAB — GC/CHLAMYDIA PROBE AMP (~~LOC~~) NOT AT ARMC
Chlamydia: NEGATIVE
Comment: NEGATIVE
Comment: NORMAL
Neisseria Gonorrhea: NEGATIVE

## 2022-03-31 ENCOUNTER — Ambulatory Visit: Payer: Medicaid Other | Admitting: Physician Assistant

## 2022-03-31 ENCOUNTER — Encounter: Payer: Self-pay | Admitting: Physician Assistant

## 2022-03-31 VITALS — BP 129/75 | HR 82 | Resp 18 | Ht 60.0 in | Wt 206.0 lb

## 2022-03-31 DIAGNOSIS — Z021 Encounter for pre-employment examination: Secondary | ICD-10-CM

## 2022-03-31 NOTE — Progress Notes (Unsigned)
   Established Patient Office Visit  Subjective   Patient ID: Sonya Frazier, female    DOB: 1996-01-16  Age: 26 y.o. MRN: [id number]  Chief Complaint  Patient presents with   Employment Physical    Patient presents for preemployment physical.  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  {Labs (Optional):23779}  The ASCVD Risk score (Arnett DK, et al., 2019) failed to calculate for the following reasons:   The 2019 ASCVD risk score is only valid for ages 28 to 37    Assessment & Plan:   Problem List Items Addressed This Visit   None Visit Diagnoses     Physical exam, pre-employment    -  Primary       Return if symptoms worsen or fail to improve.    Kasandra Knudsen Mayers, PA-C

## 2022-03-31 NOTE — Patient Instructions (Signed)
Please let us know if there is anything else we can do for you  Kennieth Rad, PA-C Physician Assistant [facility] Medicine [email or web address]   Back Injury Prevention Back injuries can be very painful. They can also be difficult to heal. After having one back injury, you are more likely to have another. It is important to learn how to avoid injuring or re-injuring your back. The following tips can help you prevent a back injury. What actions can I take to prevent back injuries? Nutrition changes Talk with your health care provider about your overall diet, and especially about foods that strengthen your bones. Ask your health care provider how much calcium and vitamin D you need each day. These nutrients help to prevent weakening of the bones (osteoporosis). Osteoporosis can cause broken (fractured) bones, which lead to back pain. Eat foods that are good sources of calcium. These include dairy products, green leafy vegetables, and products that have had calcium added to them (are fortified). Eat foods that are good sources of vitamin D. These include milk and foods that are fortified with vitamin D. If needed, take supplements and vitamins as directed by your health care provider. Physical fitness Physical fitness strengthens your bones and your muscles. It also increases your balance and strength. Exercise for 30 minutes a day on most days of the week, or as directed by your health care provider. Make sure to: Do aerobic exercises, such as walking, jogging, biking, or swimming. Do exercises that increase balance and strength, such as tai chi and yoga. These can decrease your risk of falling and injuring your back. Do stretching exercises to help with flexibility. Develop strong abdominal muscles. Your abdominal muscles provide a lot of the support that your back needs. Maintain a healthy weight. This helps to decrease your risk of a back  injury. Good posture        Prevent back injuries by developing and maintaining a good posture. To do this successfully: Sit up and stand up straight. Avoid leaning forward when you sit or hunching over when you stand. Choose chairs that have good low-back (lumbar) support. If you work at a desk, sit close to it so you do not need to lean over. Keep your chin tucked in. Keep your neck drawn back, and keep your elbows bent at a right angle. Sit high and close to the steering wheel when you drive. Add lumbar support to your car seat, if needed. Avoid sitting or standing in one position for very long. Take breaks to get up, stretch, and walk around at least one time every hour. Take breaks every hour if you are driving for long periods of time. Sleep on your side with your knees slightly bent, or sleep on your back with a pillow under your knees. Keep your head and neck in a straight line with your spine (neutral position) when using electronic equipment like smartphones or tablets. To do this: Raise your smartphone or tablet to look at it instead of bending your head or neck to look down. Put the smartphone or tablet at the level of your face while looking at the screen.  Lifting, twisting, and reaching Back injuries are more likely to occur when carrying loads and bending or twisting at the same time. When you bend and lift, or reach for items that are high up on shelves, use positions that put less stress on your back. Heavy lifting Avoid heavy lifting, especially the kind of heavy lifting that is  repetitive. If you must do heavy lifting: Stretch before lifting. Work slowly. Rest between lifts. Use a tool such as a cart or a dolly to move objects. Make several small trips instead of carrying one heavy load. Ask for help when you need it, especially when moving big or heavy objects. Follow these steps when lifting: Stand with your feet shoulder-width apart. Get as close to the object as  you can. Do not try to pick up a heavy object that is far from your body. Use handles or lifting straps if they are available. Bend at your knees. Squat down, but keep your heels off the floor. Keep your shoulders pulled back, your chin tucked in, and your back straight. Lift the object slowly while you tighten the muscles in your legs, abdomen, and buttocks. Keep the object as close to the center of your body as possible. Follow these steps when putting down a heavy load: Stand with your feet shoulder-width apart. Lower the object slowly while you tighten the muscles in your legs, abdomen, and buttocks. Keep the object as close to the center of your body as possible. Keep your shoulders pulled back, your chin tucked in, and your back straight. Bend at your knees. Squat down, but keep your heels off the floor. Use handles or lifting straps if they are available. Twisting and reaching Avoid lifting heavy objects above your waist. Do not twist at your waist while you are lifting or carrying a load. If you need to turn, move your feet. Do not bend over without bending at your knees. Avoid reaching over your head, across a table, or for an object on a high surface.  Other things to do  Avoid wet floors and icy ground. Keep sidewalks clear of ice to prevent falls. Do not sleep on a mattress that is too soft or too hard. Put heavier objects on shelves at waist level, and put lighter objects on lower or higher shelves. Find ways to decrease your stress, such as by exercising, getting a massage, or practicing relaxation techniques. Stress can build up in your muscles. Tense muscles are more vulnerable to injury. Talk with your health care provider if you feel anxious or depressed. These conditions can make back pain worse. Wear flat heeled shoes with cushioned soles. Use both shoulder straps when carrying a backpack. Do not use any products that contain nicotine or tobacco. These products include  cigarettes, chewing tobacco, and vaping devices, such as e-cigarettes. If you need help quitting, ask your health care provider. Summary Back injuries can be very painful and difficult to heal. You can prevent injuring or re-injuring your back by making nutrition changes, working on being physically fit, developing a good posture, and lifting heavy objects in a safe way. Ask your health care provider how much calcium and vitamin D you need each day. These nutrients help to prevent weakening of the bones (osteoporosis). This information is not intended to replace advice given to you by your health care provider. Make sure you discuss any questions you have with your health care provider. Document Revised: 11/11/2020 Document Reviewed: 11/11/2020 Elsevier Patient Education  [place].

## 2022-04-01 ENCOUNTER — Encounter: Payer: Self-pay | Admitting: Physician Assistant

## 2022-04-07 ENCOUNTER — Emergency Department (HOSPITAL_BASED_OUTPATIENT_CLINIC_OR_DEPARTMENT_OTHER)
Admission: EM | Admit: 2022-04-07 | Discharge: 2022-04-07 | Disposition: A | Discharge status: Home / Self Care | Payer: Self-pay | Attending: Emergency Medicine | Admitting: Emergency Medicine

## 2022-04-07 ENCOUNTER — Emergency Department (HOSPITAL_BASED_OUTPATIENT_CLINIC_OR_DEPARTMENT_OTHER): Payer: Self-pay | Admitting: Radiology

## 2022-04-07 ENCOUNTER — Encounter (HOSPITAL_BASED_OUTPATIENT_CLINIC_OR_DEPARTMENT_OTHER): Payer: Self-pay | Admitting: Obstetrics and Gynecology

## 2022-04-07 ENCOUNTER — Other Ambulatory Visit: Payer: Self-pay

## 2022-04-07 DIAGNOSIS — S20212A Contusion of left front wall of thorax, initial encounter: Secondary | ICD-10-CM | POA: Insufficient documentation

## 2022-04-07 DIAGNOSIS — S299XXA Unspecified injury of thorax, initial encounter: Secondary | ICD-10-CM | POA: Diagnosis present

## 2022-04-07 DIAGNOSIS — Y92410 Unspecified street and highway as the place of occurrence of the external cause: Secondary | ICD-10-CM | POA: Insufficient documentation

## 2022-04-07 MED ORDER — IBUPROFEN 400 MG PO TABS
400.0000 mg | ORAL_TABLET | Freq: Once | ORAL | Status: AC
  Administered 2022-04-07: 400 mg via ORAL
  Filled 2022-04-07: qty 1

## 2022-04-07 MED ORDER — ACETAMINOPHEN 500 MG PO TABS
1000.0000 mg | ORAL_TABLET | Freq: Once | ORAL | Status: AC
  Administered 2022-04-07: 1000 mg via ORAL
  Filled 2022-04-07: qty 2
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
## 2022-07-03 DIAGNOSIS — Z419 Encounter for procedure for purposes other than remedying health state, unspecified: Secondary | ICD-10-CM | POA: Diagnosis not present

## 2022-08-03 DIAGNOSIS — Z419 Encounter for procedure for purposes other than remedying health state, unspecified: Secondary | ICD-10-CM | POA: Diagnosis not present

## 2022-09-03 DIAGNOSIS — Z419 Encounter for procedure for purposes other than remedying health state, unspecified: Secondary | ICD-10-CM | POA: Diagnosis not present

## 2022-10-02 DIAGNOSIS — Z419 Encounter for procedure for purposes other than remedying health state, unspecified: Secondary | ICD-10-CM | POA: Diagnosis not present

## 2022-11-02 DIAGNOSIS — Z419 Encounter for procedure for purposes other than remedying health state, unspecified: Secondary | ICD-10-CM | POA: Diagnosis not present

## 2022-11-05 IMAGING — US US PELVIS COMPLETE WITH TRANSVAGINAL
1 series · 13 of 25 positions shown · non-contrast
Comparison: Pelvic ultrasound 05/14/2019

CLINICAL DATA: Right lower quadrant pain for 2-3 weeks. History of
prior ectopic.



[Series 1: us pelvis complete with transvaginal · 0.22mm/px · 13 of 50 slices shown]
[im 1/50]
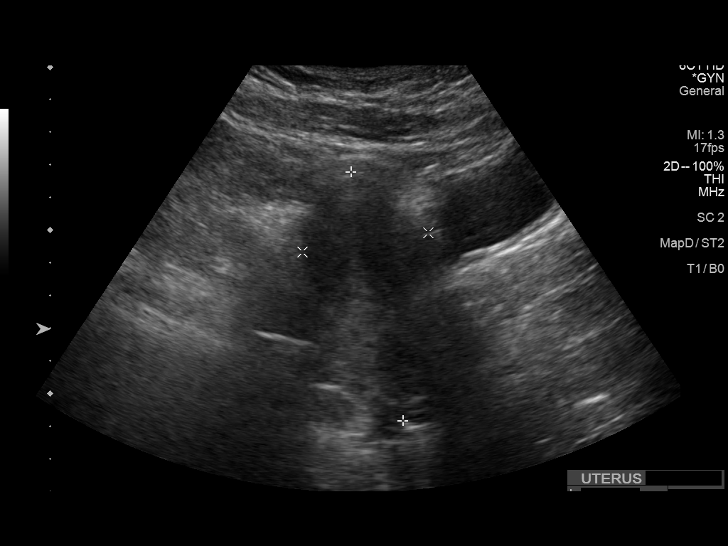
[im 5/50]
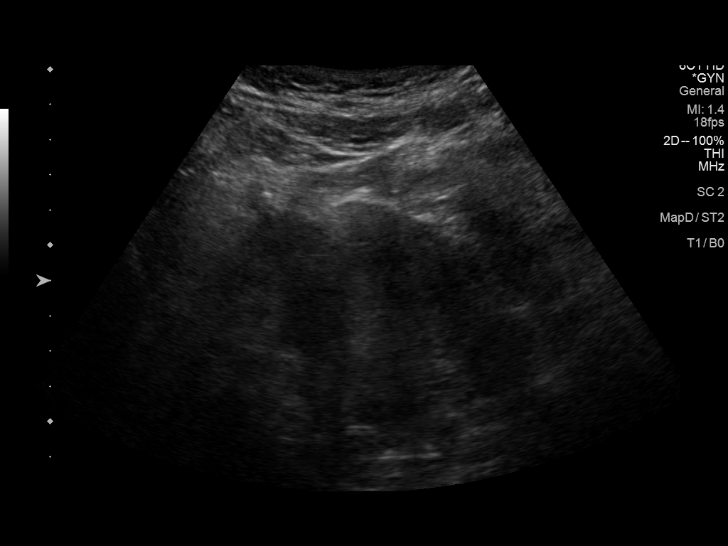
[im 9/50]
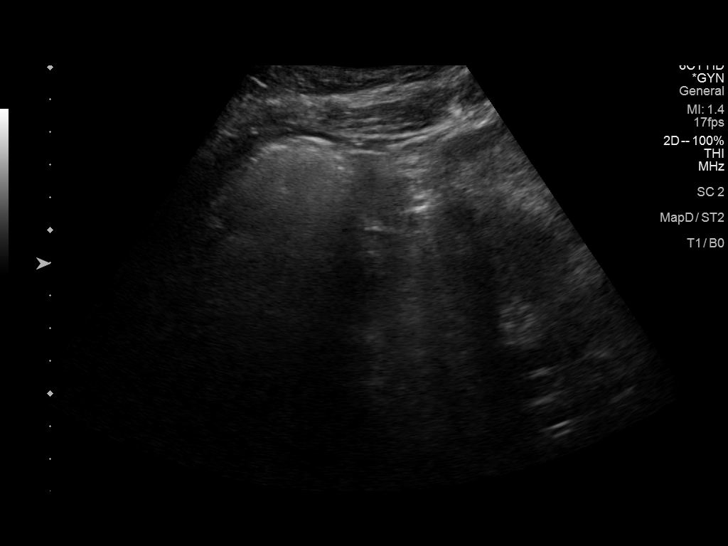
[im 13/50]
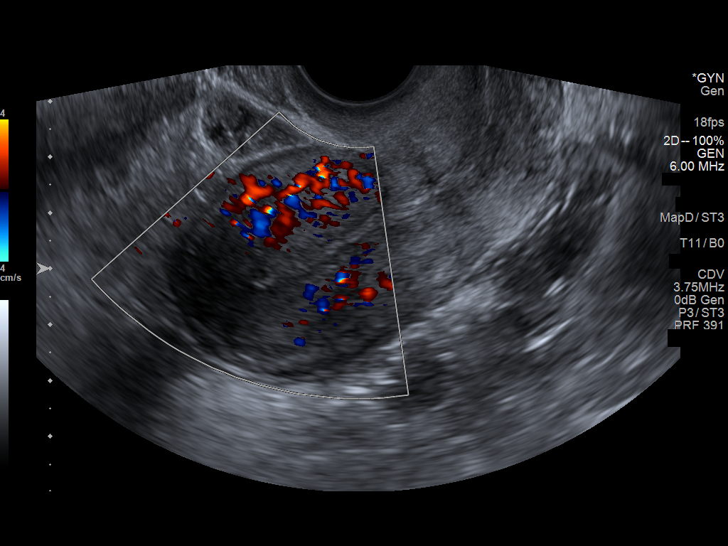
[im 17/50]
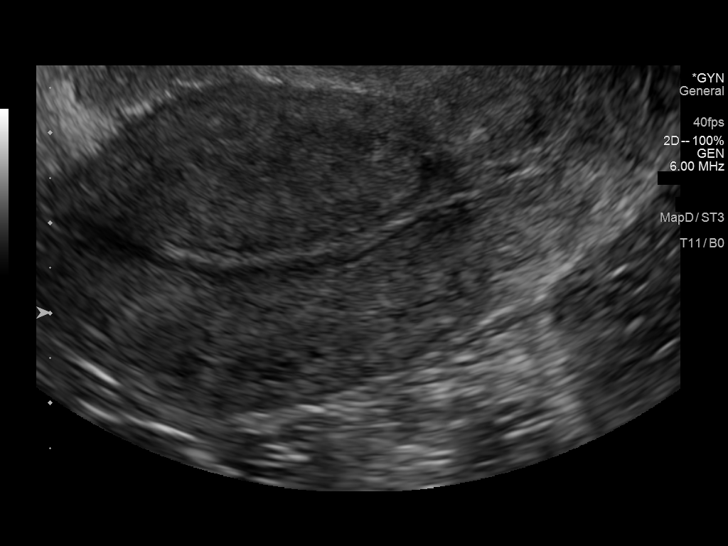
[im 21/50]
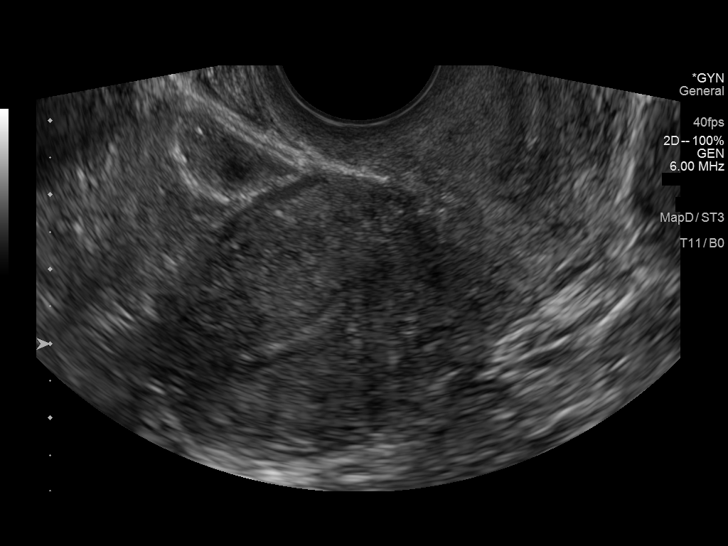
[im 25/50]
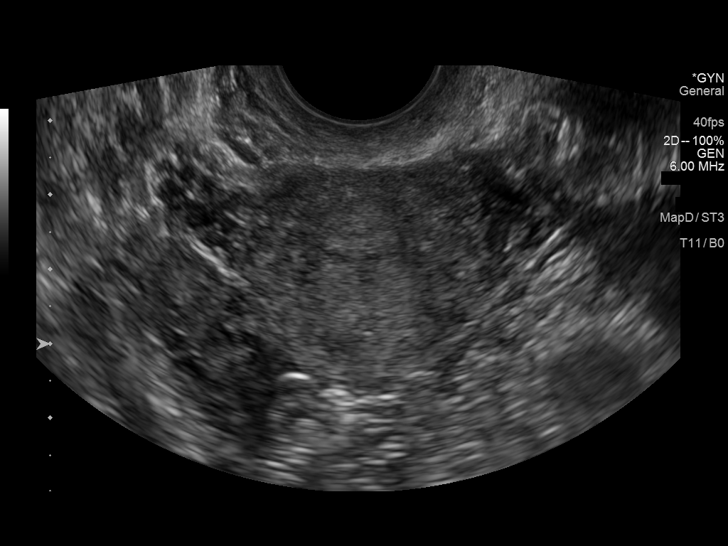
[im 29/50]
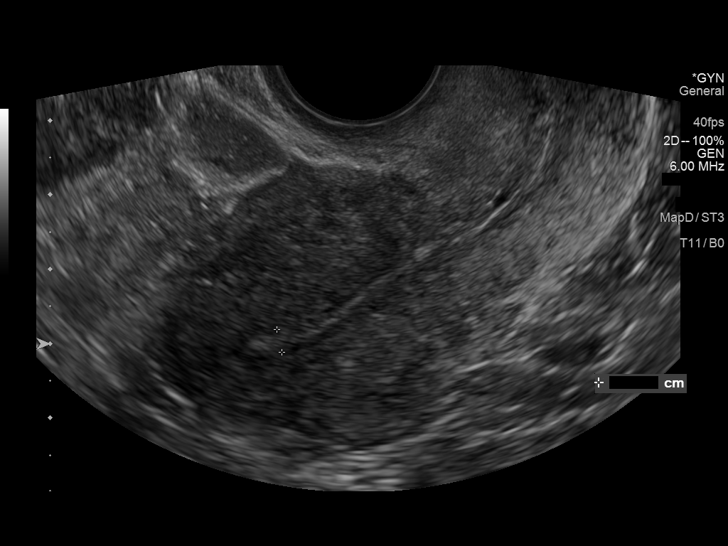
[im 33/50]
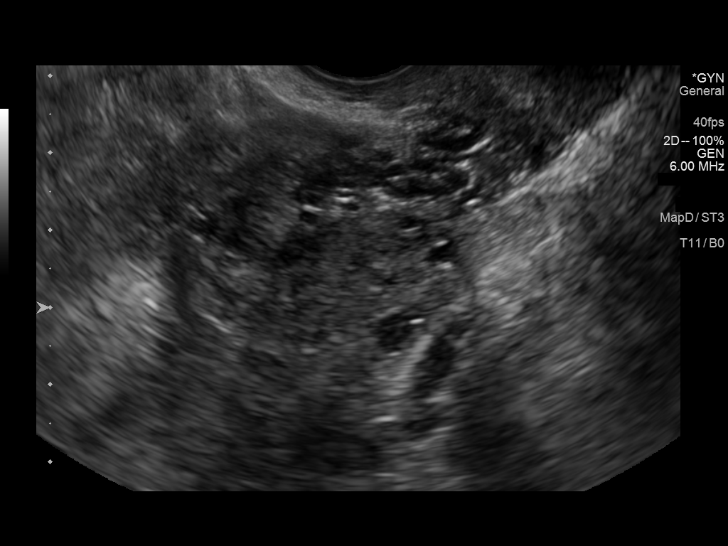
[im 37/50]
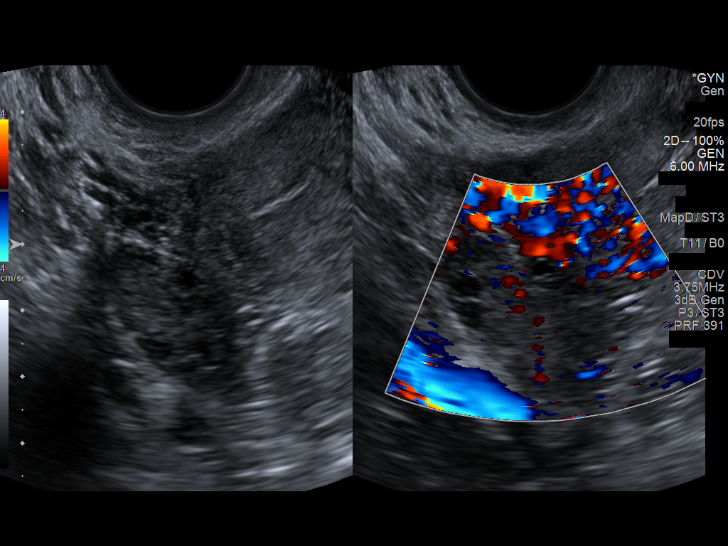
[im 41/50]
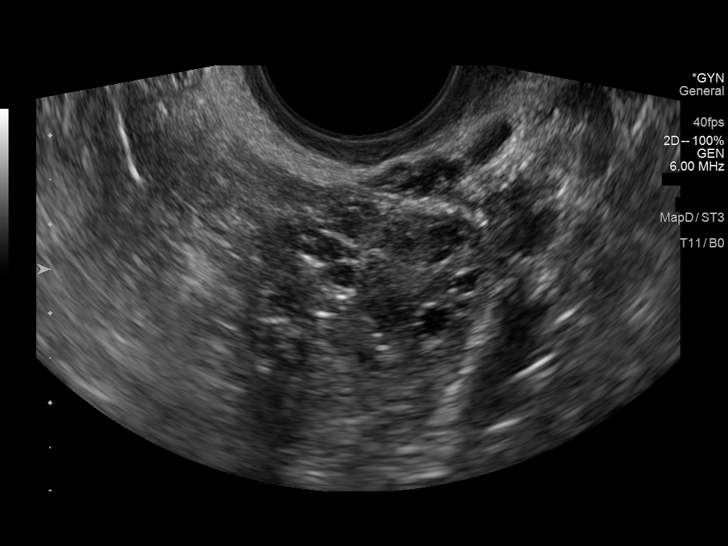
[im 45/50]
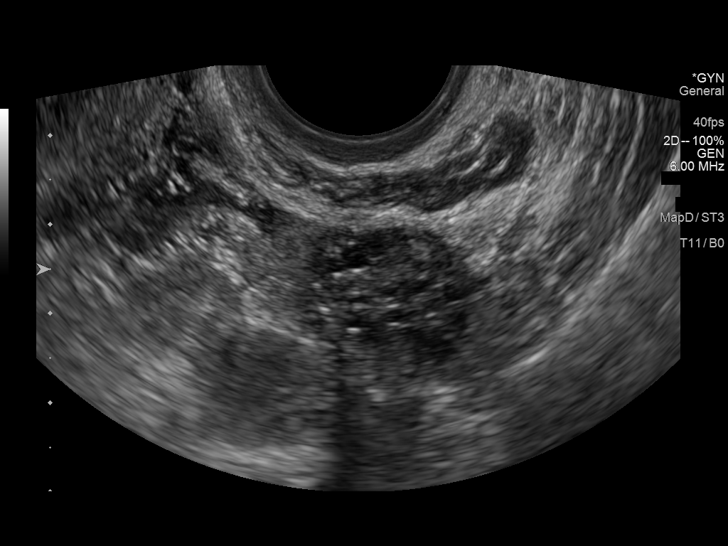
[im 50/50]
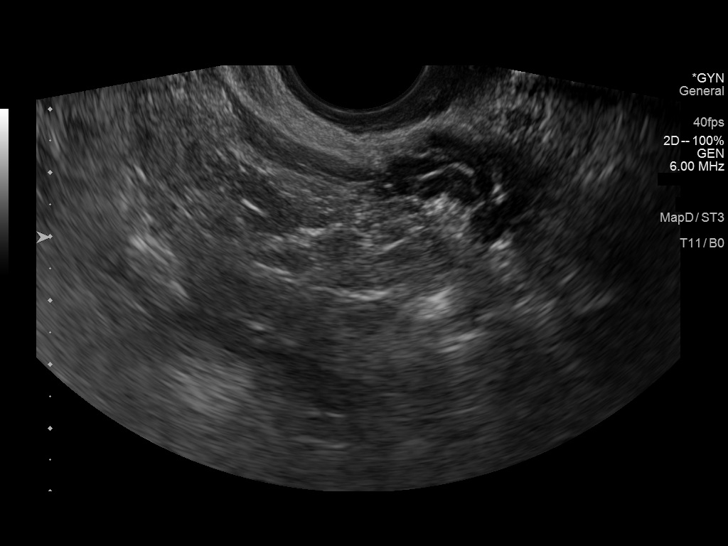

[13 of 25 positions shown; findings below may reference images not displayed]

FINDINGS: Uterus

Measurements: 7.8 x 3.9 x 4 5 cm = volume: 72 mL. No fibroids or
other mass visualized.

Endometrium

Thickness: 3.1 mm, non thickened.  No focal abnormality visualized.

Right ovary

Measurements: 2.7 x 1.6 x 2.6 cm = volume: 6 mL. Numerous anechoic
follicles organized around an echogenic central stroma (37/51).
Patient chart indicating a history of prior unilateral right
salpingectomy.

Left ovary

Measurements: 2.5 x 1.5 x 1.6 cm = volume: 3.3 mL. Numerous anechoic
follicles organized around an echogenic central stroma (47/51).

Other findings

No abnormal free fluid.
IMPRESSION: 1. Surgical history indicating prior laparoscopic right
salpingectomy.
2. Both ovaries demonstrating fairly numerous anechoic follicles
organized around echogenic central stroma, can be seen in the
setting of polycystic ovarian syndrome. Correlate with clinical
signs and symptoms and serologies where appropriate.
3. Otherwise unremarkable pelvic ultrasound. No evidence of ovarian
torsion or other concerning adnexal mass.

## 2022-12-01 ENCOUNTER — Other Ambulatory Visit: Payer: Self-pay

## 2022-12-01 ENCOUNTER — Encounter (HOSPITAL_COMMUNITY): Payer: Self-pay

## 2022-12-01 ENCOUNTER — Emergency Department (HOSPITAL_COMMUNITY): Payer: Medicaid Other

## 2022-12-01 ENCOUNTER — Emergency Department (HOSPITAL_COMMUNITY)
Admission: EM | Admit: 2022-12-01 | Discharge: 2022-12-01 | Disposition: A | Discharge status: Home / Self Care | Payer: Medicaid Other | Attending: Emergency Medicine | Admitting: Emergency Medicine

## 2022-12-01 DIAGNOSIS — Y92410 Unspecified street and highway as the place of occurrence of the external cause: Secondary | ICD-10-CM | POA: Insufficient documentation

## 2022-12-01 DIAGNOSIS — M542 Cervicalgia: Secondary | ICD-10-CM | POA: Insufficient documentation

## 2022-12-01 DIAGNOSIS — M7918 Myalgia, other site: Secondary | ICD-10-CM

## 2022-12-01 DIAGNOSIS — R519 Headache, unspecified: Secondary | ICD-10-CM | POA: Insufficient documentation

## 2022-12-01 DIAGNOSIS — S0990XA Unspecified injury of head, initial encounter: Secondary | ICD-10-CM | POA: Diagnosis not present

## 2022-12-01 DIAGNOSIS — S0993XA Unspecified injury of face, initial encounter: Secondary | ICD-10-CM | POA: Diagnosis not present

## 2022-12-01 DIAGNOSIS — M791 Myalgia, unspecified site: Secondary | ICD-10-CM | POA: Diagnosis not present

## 2022-12-01 DIAGNOSIS — Z743 Need for continuous supervision: Secondary | ICD-10-CM | POA: Diagnosis not present

## 2022-12-01 DIAGNOSIS — M79631 Pain in right forearm: Secondary | ICD-10-CM | POA: Insufficient documentation

## 2022-12-01 DIAGNOSIS — T1490XA Injury, unspecified, initial encounter: Secondary | ICD-10-CM | POA: Diagnosis not present

## 2022-12-01 DIAGNOSIS — R0789 Other chest pain: Secondary | ICD-10-CM | POA: Diagnosis not present

## 2022-12-01 LAB — URINALYSIS, ROUTINE W REFLEX MICROSCOPIC
Bilirubin Urine: NEGATIVE
Glucose, UA: NEGATIVE mg/dL
Ketones, ur: NEGATIVE mg/dL
Leukocytes,Ua: NEGATIVE
Nitrite: NEGATIVE
Protein, ur: NEGATIVE mg/dL
Specific Gravity, Urine: 1.009 (ref 1.005–1.030)
pH: 7 (ref 5.0–8.0)

## 2022-12-01 LAB — COMPREHENSIVE METABOLIC PANEL
ALT: 17 U/L (ref 0–44)
AST: 17 U/L (ref 15–41)
Albumin: 3.2 g/dL — ABNORMAL LOW (ref 3.5–5.0)
Alkaline Phosphatase: 74 U/L (ref 38–126)
Anion gap: 11 (ref 5–15)
BUN: 10 mg/dL (ref 6–20)
CO2: 22 mmol/L (ref 22–32)
Calcium: 8.8 mg/dL — ABNORMAL LOW (ref 8.9–10.3)
Chloride: 108 mmol/L (ref 98–111)
Creatinine, Ser: 0.75 mg/dL (ref 0.44–1.00)
GFR, Estimated: >60 mL/min (ref >60)
Glucose, Bld: 91 mg/dL (ref 70–99)
Potassium: 4 mmol/L (ref 3.5–5.1)
Sodium: 141 mmol/L (ref 135–145)
Total Bilirubin: 0.5 mg/dL (ref 0.3–1.2)
Total Protein: 7.7 g/dL (ref 6.5–8.1)

## 2022-12-01 LAB — CBC
HCT: 38 % (ref 36.0–46.0)
Hemoglobin: 12.4 g/dL (ref 12.0–15.0)
MCH: 27.9 pg (ref 26.0–34.0)
MCHC: 32.6 g/dL (ref 30.0–36.0)
MCV: 85.4 fL (ref 80.0–100.0)
Platelets: 290 10*3/uL (ref 150–400)
RBC: 4.45 MIL/uL (ref 3.87–5.11)
RDW: 14.4 % (ref 11.5–15.5)
WBC: 8 10*3/uL (ref 4.0–10.5)
nRBC: 0 % (ref 0.0–0.2)

## 2022-12-01 LAB — I-STAT BETA HCG BLOOD, ED (MC, WL, AP ONLY): I-stat hCG, quantitative: <5 m[IU]/mL (ref <5)

## 2022-12-01 MED ORDER — CYCLOBENZAPRINE HCL 10 MG PO TABS: 10.0000 mg | ORAL_TABLET | Freq: Three times a day (TID) | ORAL | 0 refills | Status: DC | PRN

## 2022-12-01 MED ORDER — KETOROLAC TROMETHAMINE 30 MG/ML IJ SOLN
30.0000 mg | Freq: Once | INTRAMUSCULAR | Status: AC
  Administered 2022-12-01: 30 mg via INTRAVENOUS
  Filled 2022-12-01: qty 1

## 2022-12-01 MED ORDER — SODIUM CHLORIDE 0.9 % IV BOLUS
1000.0000 mL | Freq: Once | INTRAVENOUS | Status: AC
  Administered 2022-12-01: 1000 mL via INTRAVENOUS

## 2022-12-01 MED ORDER — IBUPROFEN 800 MG PO TABS: 800.0000 mg | ORAL_TABLET | Freq: Three times a day (TID) | ORAL | 0 refills | Status: AC | PRN

## 2022-12-01 MED ORDER — FENTANYL CITRATE PF 50 MCG/ML IJ SOSY
50.0000 ug | PREFILLED_SYRINGE | Freq: Once | INTRAMUSCULAR | Status: AC
  Administered 2022-12-01: 50 ug via INTRAVENOUS
  Filled 2022-12-01: qty 1

## 2022-12-01 MED ORDER — ONDANSETRON HCL 4 MG/2ML IJ SOLN
4.0000 mg | Freq: Once | INTRAMUSCULAR | Status: AC
  Administered 2022-12-01: 4 mg via INTRAVENOUS
  Filled 2022-12-01: qty 2

## 2022-12-01 MED ORDER — IOHEXOL 350 MG/ML SOLN
75.0000 mL | Freq: Once | INTRAVENOUS | Status: AC | PRN
  Administered 2022-12-01: 75 mL via INTRAVENOUS

## 2022-12-01 NOTE — Discharge Instructions (Addendum)
Your imaging did not reveal any signs of fracture or bleeding which is very good news.  Your lab work was also reassuring.  I do recommend you follow-up with your primary care doctor.  I have prescribed Advil and muscle relaxer to take 3 times a day as needed.  You are likely to be very sore in the next few days, this can be normal. Should you experience any loss of consciousness, bleeding, please return for care.
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
## 2022-12-01 NOTE — ED Notes (Signed)
Patient transported to CT 
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
## 2022-12-02 DIAGNOSIS — Z419 Encounter for procedure for purposes other than remedying health state, unspecified: Secondary | ICD-10-CM | POA: Diagnosis not present

## 2023-01-02 DIAGNOSIS — Z419 Encounter for procedure for purposes other than remedying health state, unspecified: Secondary | ICD-10-CM | POA: Diagnosis not present

## 2023-02-01 DIAGNOSIS — Z419 Encounter for procedure for purposes other than remedying health state, unspecified: Secondary | ICD-10-CM | POA: Diagnosis not present

## 2023-03-04 DIAGNOSIS — Z419 Encounter for procedure for purposes other than remedying health state, unspecified: Secondary | ICD-10-CM | POA: Diagnosis not present

## 2023-04-04 DIAGNOSIS — Z419 Encounter for procedure for purposes other than remedying health state, unspecified: Secondary | ICD-10-CM | POA: Diagnosis not present

## 2023-05-04 DIAGNOSIS — Z419 Encounter for procedure for purposes other than remedying health state, unspecified: Secondary | ICD-10-CM | POA: Diagnosis not present

## 2023-06-04 DIAGNOSIS — Z419 Encounter for procedure for purposes other than remedying health state, unspecified: Secondary | ICD-10-CM | POA: Diagnosis not present

## 2023-07-04 DIAGNOSIS — Z419 Encounter for procedure for purposes other than remedying health state, unspecified: Secondary | ICD-10-CM | POA: Diagnosis not present

## 2023-08-04 DIAGNOSIS — Z419 Encounter for procedure for purposes other than remedying health state, unspecified: Secondary | ICD-10-CM | POA: Diagnosis not present

## 2023-09-04 DIAGNOSIS — Z419 Encounter for procedure for purposes other than remedying health state, unspecified: Secondary | ICD-10-CM | POA: Diagnosis not present

## 2023-10-02 DIAGNOSIS — Z419 Encounter for procedure for purposes other than remedying health state, unspecified: Secondary | ICD-10-CM | POA: Diagnosis not present

## 2023-11-13 DIAGNOSIS — Z419 Encounter for procedure for purposes other than remedying health state, unspecified: Secondary | ICD-10-CM | POA: Diagnosis not present

## 2023-12-13 DIAGNOSIS — Z419 Encounter for procedure for purposes other than remedying health state, unspecified: Secondary | ICD-10-CM | POA: Diagnosis not present

## 2024-02-17 ENCOUNTER — Ambulatory Visit (HOSPITAL_COMMUNITY)
Admission: EM | Admit: 2024-02-17 | Discharge: 2024-02-17 | Disposition: A | Discharge status: Home / Self Care | Attending: Family Medicine | Admitting: Family Medicine

## 2024-02-17 ENCOUNTER — Encounter (HOSPITAL_COMMUNITY): Payer: Self-pay

## 2024-02-17 DIAGNOSIS — Z3202 Encounter for pregnancy test, result negative: Secondary | ICD-10-CM | POA: Diagnosis not present

## 2024-02-17 DIAGNOSIS — R350 Frequency of micturition: Secondary | ICD-10-CM | POA: Diagnosis not present

## 2024-02-17 DIAGNOSIS — R109 Unspecified abdominal pain: Secondary | ICD-10-CM | POA: Diagnosis not present

## 2024-02-17 LAB — POCT URINALYSIS DIP (MANUAL ENTRY)
Bilirubin, UA: NEGATIVE
Glucose, UA: NEGATIVE mg/dL
Ketones, POC UA: NEGATIVE mg/dL
Nitrite, UA: NEGATIVE
Protein Ur, POC: NEGATIVE mg/dL
Spec Grav, UA: 1.015 (ref 1.010–1.025)
Urobilinogen, UA: 0.2 U/dL
pH, UA: 7 (ref 5.0–8.0)

## 2024-02-17 LAB — POCT URINE PREGNANCY: Preg Test, Ur: NEGATIVE

## 2024-02-17 MED ORDER — KETOROLAC TROMETHAMINE 30 MG/ML IJ SOLN
30.0000 mg | Freq: Once | INTRAMUSCULAR | Status: AC
  Administered 2024-02-17: 30 mg via INTRAMUSCULAR

## 2024-02-17 MED ORDER — KETOROLAC TROMETHAMINE 30 MG/ML IJ SOLN
INTRAMUSCULAR | Status: AC
  Filled 2024-02-17: qty 1

## 2024-02-17 MED ORDER — CIPROFLOXACIN HCL 500 MG PO TABS: 500.0000 mg | ORAL_TABLET | Freq: Two times a day (BID) | ORAL | 0 refills | Status: AC

## 2024-02-17 NOTE — Discharge Instructions (Signed)
 We are treating you for urinary tract infection with the Cipro .  Take this twice daily with food for the next 5 days.  Ensure you are drinking at least 64 ounces of water daily to help flush out your kidneys.  The IM Toradol  we have given you in clinic will help with pain.  For any breakthrough pain you can take 500 mg of Tylenol  every 6-8 hours as needed.  Seek immediate care if you are unable to urinate, develop severe pain, fever, or new concerning symptoms.

## 2024-02-17 NOTE — ED Provider Notes (Signed)
 [facility]    CSN: [id number] Arrival date & time: 02/17/24  9077      History   Chief Complaint Chief Complaint  Patient presents with   Back Pain   Urinary Frequency    HPI Sonya Frazier is a 28 y.o. female.   Patient presents to clinic over concern of bilateral flank pain, urgency and frequency for the past 5 days.  Has not had any dysuria, hematuria, pelvic or abdominal pain.  Denies vaginal discharge.  Noticed initially when she was sitting down to urinate that she would have to get right back on the toilet to urinate and it was small frequent amounts.  Then started to develop the bilateral low back pain.  She was in a lot of pain yesterday and attempted to come to urgent care and the ED, however, the wait time was too long.  The history is provided by the patient and medical records.  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   Breast pain, left 03/07/2020   Galactorrhea of left breast 03/07/2020   Family history of breast cancer 03/07/2020    Past Surgical History:  Procedure Laterality Date   CESAREAN SECTION N/A 09/10/2017   Procedure: CESAREAN SECTION;  Surgeon: Jayne Vonn DEL, MD;  Location: [facility];  Service: Obstetrics;  Laterality: N/A;   LAPAROSCOPIC UNILATERAL SALPINGECTOMY Right 01/13/2019   Procedure: LAPAROSCOPIC UNILATERAL SALPINGECTOMY;  Surgeon: Jayne Vonn DEL, MD;  Location: [facility] OR;  Service: Gynecology;  Laterality: Right;   WISDOM TOOTH EXTRACTION       OB History     Gravida  3   Para  1   Term  1   Preterm  0   AB  2   Living  1      SAB  1   IAB  0   Ectopic  1   Multiple  0   Live Births  1            Home Medications    Prior to Admission medications   Medication Sig Start Date End Date Taking? Authorizing Provider  ciprofloxacin  (CIPRO ) 500 MG tablet Take 1 tablet (500 mg total) by mouth every 12 (twelve) hours for 5 days. 02/17/24 02/22/24 Yes Nevaeha Finerty  N, FNP  Iron , Ferrous Sulfate , 325 (65 Fe) MG TABS Take 325 mg by mouth daily. 07/18/21   Fleming, Zelda W, NP  metroNIDAZOLE  (FLAGYL ) 500 MG tablet Take 1 tablet (500 mg total) by mouth 2 (two) times daily. One po bid x 7 days Patient not taking: Reported on 03/31/2022 01/30/22   Molpus, Norleen, MD    Family History Family History  Problem Relation Age of Onset   Asthma Mother    Cancer Maternal Aunt     Social History Social History   Tobacco Use   Smoking status: Never   Smokeless tobacco: Never  Vaping Use   Vaping status: Never Used  Substance Use Topics   Alcohol use: Yes    Comment: occ   Drug use: Not Currently    Types: Marijuana    Comment: Quit approx 1 year ago     Allergies   Patient has no known allergies.   Review of Systems Review of Systems  Per HPI  Physical Exam Triage Vital Signs ED Triage Vitals [02/17/24 0942]  Encounter Vitals Group     BP (!) 108/57     Girls Systolic BP Percentile      Girls Diastolic BP Percentile      Boys Systolic BP Percentile      Boys Diastolic BP Percentile      Pulse Rate 99     Resp 14     Temp 98.4 F (36.9 C)     Temp Source Oral     SpO2 97 %     Weight      Height      Head Circumference      Peak Flow      Pain Score 6     Pain Loc      Pain Education      Exclude from Growth Chart    No data found.  Updated Vital Signs BP (!) 108/57 (BP Location: Left Arm)   Pulse 99   Temp 98.4 F (36.9 C) (Oral)   Resp 14   LMP 02/03/2024 (Exact Date)    SpO2 97%   Visual Acuity Right Eye Distance:   Left Eye Distance:   Bilateral Distance:    Right Eye Near:   Left Eye Near:    Bilateral Near:     Physical Exam Vitals and nursing note reviewed.  Constitutional:      Appearance: Normal appearance.  HENT:     Head: Normocephalic and atraumatic.     Right Ear: External ear normal.     Left Ear: External ear normal.     Nose: Nose normal.     Mouth/Throat:     Mouth: Mucous membranes are moist.  Eyes:     Conjunctiva/sclera: Conjunctivae normal.  Pulmonary:     Effort: Pulmonary effort is normal. No respiratory distress.  Abdominal:     Tenderness: There is right CVA tenderness and left CVA tenderness.  Skin:    General: Skin is warm and dry.  Neurological:     General: No focal deficit present.     Mental Status: She is alert and oriented to person, place, and time.  Psychiatric:        Mood and Affect: Mood normal.        Behavior: Behavior normal.      UC Treatments / Results  Labs (all labs ordered are listed, but only abnormal results are displayed) Labs Reviewed  POCT URINALYSIS DIP (MANUAL ENTRY) - Abnormal; Notable for the following components:      Result Value   Blood, UA trace-intact (*)    Leukocytes, UA Trace (*)    All other components within normal limits  URINE CULTURE  POCT URINE PREGNANCY    EKG   Radiology No results found.  Procedures Procedures (including critical care time)  Medications Ordered in UC Medications  ketorolac  (TORADOL ) 30 MG/ML injection 30 mg (has no administration in time range)    Initial Impression / Assessment and Plan / UC Course  I have reviewed the triage vital signs and the nursing notes.  Pertinent labs & imaging results that were available during my care of the  patient were reviewed by me and considered in my medical decision making (see chart for details).  Vitals and triage reviewed, patient is hemodynamically stable.  UA with trace leukocytes and  trace red blood cells.  Having bilateral flank pain with CVA tenderness and urinary symptoms.  Will cover with ciprofloxacin  for pyelonephritis, patient is afebrile, heart rate is 99.  Urine sent for culture.  IM Toradol  in clinic for pain, urine pregnancy negative.  Plan of care, follow-up care return precautions given, no questions at this time.     Final Clinical Impressions(s) / UC Diagnoses   Final diagnoses:  Flank pain  Urinary frequency     Discharge Instructions      We are treating you for urinary tract infection with the Cipro .  Take this twice daily with food for the next 5 days.  Ensure you are drinking at least 64 ounces of water daily to help flush out your kidneys.  The IM Toradol  we have given you in clinic will help with pain.  For any breakthrough pain you can take 500 mg of Tylenol  every 6-8 hours as needed.  Seek immediate care if you are unable to urinate, develop severe pain, fever, or new concerning symptoms.    ED Prescriptions     Medication Sig Dispense Auth. Provider   ciprofloxacin  (CIPRO ) 500 MG tablet Take 1 tablet (500 mg total) by mouth every 12 (twelve) hours for 5 days. 10 tablet Dreama, Synai Prettyman  N, FNP      PDMP not reviewed this encounter.   Dreama Kristine SAILOR, FNP 02/17/24 1038

## 2024-02-17 NOTE — ED Triage Notes (Signed)
 Patient reports  that she began having right lower back pain and then left back pain 5 days ago. Patient also c/o urinary frequency, but denies dysuria.  Patient reports that she has been taking Tylenol  for pain.

## 2024-02-19 LAB — URINE CULTURE: Culture: 10000 — AB

## 2024-02-21 ENCOUNTER — Ambulatory Visit (HOSPITAL_COMMUNITY): Payer: Self-pay
